# Patient Record
Sex: Male | Born: 1970 | Race: Black or African American | Hispanic: No | Marital: Married | Smoking: Former smoker
Health system: Southern US, Community
[De-identification: ages and names within clinical notes are randomized; demographics above are authoritative.]

## PROBLEM LIST (undated history)

## (undated) DIAGNOSIS — M545 Low back pain, unspecified: Secondary | ICD-10-CM

## (undated) DIAGNOSIS — F17200 Nicotine dependence, unspecified, uncomplicated: Secondary | ICD-10-CM

## (undated) DIAGNOSIS — I639 Cerebral infarction, unspecified: Secondary | ICD-10-CM

## (undated) DIAGNOSIS — I10 Essential (primary) hypertension: Secondary | ICD-10-CM

## (undated) DIAGNOSIS — L309 Dermatitis, unspecified: Secondary | ICD-10-CM

## (undated) DIAGNOSIS — M1990 Unspecified osteoarthritis, unspecified site: Secondary | ICD-10-CM

## (undated) HISTORY — DX: Nicotine dependence, unspecified, uncomplicated: F17.200

## (undated) HISTORY — DX: Essential (primary) hypertension: I10

## (undated) HISTORY — DX: Dermatitis, unspecified: L30.9

## (undated) HISTORY — DX: Low back pain: M54.5

## (undated) HISTORY — DX: Low back pain, unspecified: M54.50

## (undated) HISTORY — PX: TONSILLECTOMY: SUR1361

## (undated) HISTORY — DX: Unspecified osteoarthritis, unspecified site: M19.90

## (undated) HISTORY — PX: UPPER GASTROINTESTINAL ENDOSCOPY: SHX188

---
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
## 1997-10-20 ENCOUNTER — Emergency Department (HOSPITAL_COMMUNITY): Admission: EM | Admit: 1997-10-20 | Discharge: 1997-10-20 | Payer: Self-pay | Admitting: Emergency Medicine

## 1997-11-05 ENCOUNTER — Ambulatory Visit (HOSPITAL_COMMUNITY): Admission: RE | Admit: 1997-11-05 | Discharge: 1997-11-05 | Payer: Self-pay

## 2000-07-23 ENCOUNTER — Emergency Department (HOSPITAL_COMMUNITY): Admission: EM | Admit: 2000-07-23 | Discharge: 2000-07-23 | Payer: Self-pay | Admitting: Emergency Medicine

## 2000-07-24 ENCOUNTER — Encounter: Payer: Self-pay | Admitting: Emergency Medicine

## 2000-07-24 ENCOUNTER — Ambulatory Visit (HOSPITAL_COMMUNITY): Admission: RE | Admit: 2000-07-24 | Discharge: 2000-07-24 | Payer: Self-pay | Admitting: Emergency Medicine

## 2000-07-26 ENCOUNTER — Encounter: Payer: Self-pay | Admitting: Emergency Medicine

## 2000-07-26 ENCOUNTER — Emergency Department (HOSPITAL_COMMUNITY): Admission: EM | Admit: 2000-07-26 | Discharge: 2000-07-26 | Payer: Self-pay | Admitting: Emergency Medicine

## 2004-11-12 ENCOUNTER — Ambulatory Visit: Payer: Self-pay | Admitting: Internal Medicine

## 2004-11-16 ENCOUNTER — Ambulatory Visit: Payer: Self-pay

## 2004-11-16 ENCOUNTER — Ambulatory Visit: Payer: Self-pay | Admitting: *Deleted

## 2004-12-13 ENCOUNTER — Ambulatory Visit: Payer: Self-pay | Admitting: Internal Medicine

## 2005-03-11 ENCOUNTER — Ambulatory Visit: Payer: Self-pay | Admitting: Internal Medicine

## 2005-05-06 ENCOUNTER — Ambulatory Visit: Payer: Self-pay | Admitting: Internal Medicine

## 2005-06-03 ENCOUNTER — Ambulatory Visit: Payer: Self-pay | Admitting: Internal Medicine

## 2005-09-13 ENCOUNTER — Ambulatory Visit: Payer: Self-pay | Admitting: Internal Medicine

## 2005-11-14 ENCOUNTER — Ambulatory Visit: Payer: Self-pay | Admitting: Internal Medicine

## 2005-11-15 ENCOUNTER — Ambulatory Visit: Payer: Self-pay | Admitting: Internal Medicine

## 2005-11-16 ENCOUNTER — Ambulatory Visit (HOSPITAL_COMMUNITY): Admission: RE | Admit: 2005-11-16 | Discharge: 2005-11-16 | Payer: Self-pay | Admitting: Internal Medicine

## 2005-11-22 ENCOUNTER — Ambulatory Visit: Payer: Self-pay | Admitting: Internal Medicine

## 2005-11-25 ENCOUNTER — Encounter: Admission: RE | Admit: 2005-11-25 | Discharge: 2005-11-25 | Payer: Self-pay | Admitting: Internal Medicine

## 2005-12-06 ENCOUNTER — Ambulatory Visit: Payer: Self-pay | Admitting: Internal Medicine

## 2006-03-20 ENCOUNTER — Ambulatory Visit: Payer: Self-pay | Admitting: Internal Medicine

## 2006-04-20 ENCOUNTER — Ambulatory Visit: Payer: Self-pay | Admitting: Internal Medicine

## 2006-09-15 DIAGNOSIS — I10 Essential (primary) hypertension: Secondary | ICD-10-CM | POA: Insufficient documentation

## 2006-11-23 ENCOUNTER — Ambulatory Visit: Payer: Self-pay | Admitting: Internal Medicine

## 2006-12-04 ENCOUNTER — Ambulatory Visit: Payer: Self-pay

## 2006-12-04 ENCOUNTER — Encounter: Payer: Self-pay | Admitting: Internal Medicine

## 2007-02-07 ENCOUNTER — Telehealth: Payer: Self-pay | Admitting: Internal Medicine

## 2007-02-20 ENCOUNTER — Ambulatory Visit: Payer: Self-pay | Admitting: Internal Medicine

## 2007-02-20 DIAGNOSIS — L259 Unspecified contact dermatitis, unspecified cause: Secondary | ICD-10-CM | POA: Insufficient documentation

## 2007-03-09 ENCOUNTER — Telehealth: Payer: Self-pay | Admitting: Internal Medicine

## 2007-04-19 ENCOUNTER — Ambulatory Visit: Payer: Self-pay | Admitting: Internal Medicine

## 2007-04-19 DIAGNOSIS — F17200 Nicotine dependence, unspecified, uncomplicated: Secondary | ICD-10-CM | POA: Insufficient documentation

## 2007-06-11 ENCOUNTER — Ambulatory Visit: Payer: Self-pay | Admitting: Internal Medicine

## 2007-06-11 LAB — CONVERTED CEMR LAB
AST: 26 units/L (ref 0–37)
Albumin: 3.7 g/dL (ref 3.5–5.2)
Alkaline Phosphatase: 62 units/L (ref 39–117)
BUN: 9 mg/dL (ref 6–23)
Bilirubin, Direct: 0.2 mg/dL (ref 0.0–0.3)
Chloride: 108 meq/L (ref 96–112)
Cholesterol: 126 mg/dL (ref 0–200)
GFR calc Af Amer: 108 mL/min
GFR calc non Af Amer: 89 mL/min
Glucose, Bld: 98 mg/dL (ref 70–99)
LDL Cholesterol: 80 mg/dL (ref 0–99)
Potassium: 3.5 meq/L (ref 3.5–5.1)
Sodium: 139 meq/L (ref 135–145)

## 2007-06-18 ENCOUNTER — Ambulatory Visit: Payer: Self-pay | Admitting: Internal Medicine

## 2007-06-18 DIAGNOSIS — K5900 Constipation, unspecified: Secondary | ICD-10-CM | POA: Insufficient documentation

## 2007-07-26 ENCOUNTER — Telehealth: Payer: Self-pay | Admitting: Internal Medicine

## 2008-04-14 ENCOUNTER — Ambulatory Visit: Payer: Self-pay | Admitting: Internal Medicine

## 2008-04-14 LAB — CONVERTED CEMR LAB
Calcium: 9.5 mg/dL (ref 8.4–10.5)
GFR calc Af Amer: 96 mL/min
GFR calc non Af Amer: 80 mL/min
Glucose, Bld: 86 mg/dL (ref 70–99)
HDL: 37.1 mg/dL — ABNORMAL LOW (ref >39.0)
Potassium: 4.1 meq/L (ref 3.5–5.1)
Sodium: 141 meq/L (ref 135–145)
TSH: 0.82 microintl units/mL (ref 0.35–5.50)
Total CHOL/HDL Ratio: 3.7
VLDL: 10 mg/dL (ref 0–40)

## 2008-08-15 ENCOUNTER — Ambulatory Visit: Payer: Self-pay | Admitting: Internal Medicine

## 2008-08-15 DIAGNOSIS — B079 Viral wart, unspecified: Secondary | ICD-10-CM | POA: Insufficient documentation

## 2008-10-06 ENCOUNTER — Ambulatory Visit: Payer: Self-pay | Admitting: Internal Medicine

## 2008-10-06 LAB — CONVERTED CEMR LAB
ALT: 16 U/L (ref 0–53)
AST: 20 U/L (ref 0–37)
Albumin: 4.2 g/dL (ref 3.5–5.2)
Alkaline Phosphatase: 67 U/L (ref 39–117)
BUN: 10 mg/dL (ref 6–23)
Bilirubin, Direct: 0.3 mg/dL (ref 0.0–0.3)
CO2: 23 meq/L (ref 19–32)
Calcium: 9.5 mg/dL (ref 8.4–10.5)
Chloride: 107 meq/L (ref 96–112)
Cholesterol: 127 mg/dL (ref 0–200)
Creatinine, Ser: 1.01 mg/dL (ref 0.40–1.50)
Glucose, Bld: 95 mg/dL (ref 70–99)
HDL: 43 mg/dL (ref >39)
Indirect Bilirubin: 0.7 mg/dL (ref 0.0–0.9)
LDL Cholesterol: 76 mg/dL (ref 0–99)
Potassium: 4.2 meq/L (ref 3.5–5.3)
Sodium: 140 meq/L (ref 135–145)
Total Bilirubin: 1 mg/dL (ref 0.3–1.2)
Total CHOL/HDL Ratio: 3
Total Protein: 7.1 g/dL (ref 6.0–8.3)
Triglycerides: 38 mg/dL (ref <150)
VLDL: 8 mg/dL (ref 0–40)

## 2008-10-07 ENCOUNTER — Encounter: Payer: Self-pay | Admitting: Internal Medicine

## 2008-10-13 ENCOUNTER — Ambulatory Visit: Payer: Self-pay | Admitting: Internal Medicine

## 2008-10-13 DIAGNOSIS — B350 Tinea barbae and tinea capitis: Secondary | ICD-10-CM | POA: Insufficient documentation

## 2008-10-31 ENCOUNTER — Encounter: Payer: Self-pay | Admitting: Internal Medicine

## 2009-01-05 ENCOUNTER — Ambulatory Visit: Payer: Self-pay | Admitting: Internal Medicine

## 2009-01-05 DIAGNOSIS — M7710 Lateral epicondylitis, unspecified elbow: Secondary | ICD-10-CM | POA: Insufficient documentation

## 2009-03-20 ENCOUNTER — Ambulatory Visit: Payer: Self-pay | Admitting: Internal Medicine

## 2009-05-08 ENCOUNTER — Ambulatory Visit: Payer: Self-pay | Admitting: Internal Medicine

## 2009-06-04 ENCOUNTER — Ambulatory Visit: Payer: Self-pay | Admitting: Internal Medicine

## 2009-10-30 ENCOUNTER — Encounter: Payer: Self-pay | Admitting: Internal Medicine

## 2009-10-30 LAB — CONVERTED CEMR LAB
Calcium: 9.4 mg/dL (ref 8.4–10.5)
Glucose, Bld: 91 mg/dL (ref 70–99)
Potassium: 4.4 meq/L (ref 3.5–5.3)
Sodium: 141 meq/L (ref 135–145)

## 2009-11-06 ENCOUNTER — Ambulatory Visit: Payer: Self-pay | Admitting: Family Medicine

## 2009-11-06 ENCOUNTER — Ambulatory Visit: Payer: Self-pay | Admitting: Internal Medicine

## 2009-11-09 ENCOUNTER — Encounter: Payer: Self-pay | Admitting: Family Medicine

## 2010-01-11 ENCOUNTER — Telehealth: Payer: Self-pay | Admitting: Internal Medicine

## 2010-03-23 NOTE — Assessment & Plan Note (Signed)
Summary: 6 month follow up/mhf--Rm 3   Vital Signs:  Patient profile:   40 year old male Height:      69 inches Weight:      235.25 pounds BMI:     34.87 Temp:     97.9 degrees F oral Pulse rate:   90 / minute Pulse rhythm:   regular Resp:     18 per minute BP sitting:   140 / 94  (right arm) Cuff size:   large  Vitals Entered By: Mervin Kung CMA Duncan Dull) (November 06, 2009 3:41 PM) CC: Rm 3  6 month follow up. Pt having bilateral arm pain and wants to know if it is due to his arthritis. Is Patient Diabetic? No Comments Pt states he only takes Exforge HCT. Rx ran out on voltaren and triamcinolone. Nicki Guadalajara Fergerson CMA Duncan Dull)  November 06, 2009 3:46 PM    Primary Care Provider:  Dondra Spry DO  CC:  Rm 3  6 month follow up. Pt having bilateral arm pain and wants to know if it is due to his arthritis.Marland Kitchen  History of Present Illness: 40 y/o AA male co 1 month of bilateral arm pain sharp pain with abduction arm pain with laying on side no neck pain no numbness   no upper ext weakness  Preventive Screening-Counseling & Management  Alcohol-Tobacco     Smoking Status: current     Smoking Cessation Counseling: yes     Packs/Day: 1.0  Allergies: 1)  Prednisone  Past History:  Past Surgical History: Denies surgical history         Family History: Mother is age 84, has hypertension.  Father age 70 healthy.  Grandmother has history of cerebrovascular accident and diabetes.   No family history of colon cancer.             Physical Exam  General:  alert, well-developed, and well-nourished.   Neck:  supple and no carotid bruits.   Lungs:  normal respiratory effort and normal breath sounds.   Heart:  normal rate, regular rhythm, and no gallop.   Msk:  left shoulder tenderness.  normal ROM, no joint instability.   Neurologic:  cranial nerves II-XII intact and gait normal.   Psych:  normally interactive, good eye contact, not anxious appearing, and not depressed  appearing.     Impression & Recommendations:  Problem # 1:  SHOULDER PAIN, LEFT (ICD-719.41)  left shoulder pain c/w shoulder bursitis.  refer to ortho for steroid injection Orders: Orthopedic Referral (Ortho)  His updated medication list for this problem includes:    Meloxicam 15 Mg Tabs (Meloxicam) .Marland Kitchen... 1 tab by mouth daily with food x 7 days then as needed  Problem # 2:  ECZEMA, HANDS (ICD-692.9) Assessment: Unchanged  His updated medication list for this problem includes:    Triamcinolone Acetonide 0.5 % Oint (Triamcinolone acetonide) .Marland Kitchen... Apply two times a day  Problem # 3:  HYPERTENSION (ICD-401.9) Assessment: Unchanged  His updated medication list for this problem includes:    Exforge Hct 10-160-12.5 Mg Tabs (Amlodipine-valsartan-hctz) ..... One half tab  by mouth once daily  BP today: 140/94 Prior BP: 134/80 (06/04/2009)  Labs Reviewed: K+: 4.4 (10/30/2009) Creat: : 1.12 (10/30/2009)   Chol: 127 (10/06/2008)   HDL: 43 (10/06/2008)   LDL: 76 (10/06/2008)   TG: 38 (10/06/2008)  Complete Medication List: 1)  Voltaren 1 % Gel (Diclofenac sodium) .... Apply three times a day 2)  Exforge Hct 10-160-12.5 Mg Tabs (Amlodipine-valsartan-hctz) .Marland KitchenMarland KitchenMarland Kitchen  One half tab  by mouth once daily 3)  Triamcinolone Acetonide 0.5 % Oint (Triamcinolone acetonide) .... Apply two times a day 4)  Meloxicam 15 Mg Tabs (Meloxicam) .Marland Kitchen.. 1 tab by mouth daily with food x 7 days then as needed  Other Orders: Admin 1st Vaccine (09811) Flu Vaccine 11yrs + (91478)  Patient Instructions: 1)  Please schedule a follow-up appointment in 6 months. 2)  BMP prior to visit, ICD-9:  401.9 3)  Hepatic Panel prior to visit, ICD-9:  272.4 4)  Lipid Panel prior to visit, ICD-9:  272.4 5)  Please return for lab work one (1) week before your next appointment.  Prescriptions: TRIAMCINOLONE ACETONIDE 0.5 % OINT (TRIAMCINOLONE ACETONIDE) apply two times a day  #1 tub x 3   Entered and Authorized by:   D. Thomos Lemons  DO   Signed by:   D. Thomos Lemons DO on 11/06/2009   Method used:   [id number]       [address]       [place], [place]  [address]       Ph: [phone]       Fax: [phone]   RxID:   [id number] EXFORGE HCT 10-160-12.5 MG TABS (AMLODIPINE-VALSARTAN-HCTZ) one half tab  by mouth once daily  #90 x 1   Entered and Authorized by:   D. Thomos Lemons DO   Signed by:   D. Thomos Lemons DO on 11/06/2009   Method used:   [facility]        [id number]       [address]       [place], [place]  [address]       Ph: [phone]       Fax: [phone]   RxID:   [id number]   Current Allergies (reviewed today): PREDNISONE    Flu Vaccine Consent Questions     Do you have a history of severe allergic reactions to this vaccine? no    Any prior history of allergic reactions to egg and/or gelatin? no    Do you have a sensitivity to the preservative Thimersol? no    Do you have a past history of Guillan-Barre Syndrome? no    Do you currently have an acute febrile illness? no    Have you ever had a severe reaction to latex? no    Vaccine information given and explained to patient? yes    Are you currently pregnant? no    Lot Number:[id number]   Exp Date:08/21/2010   Site Given  Left Deltoid IM.  Nicki Guadalajara Fergerson CMA Duncan Dull)  November 06, 2009 3:56 PM

## 2010-03-23 NOTE — Assessment & Plan Note (Signed)
Summary: 6 week follow up/mhf, resched- jr   Vital Signs:  Patient profile:   40 year old male Weight:      235 pounds BMI:     34.83 O2 Sat:      99 % on Room air Temp:     97.8 degrees F oral Pulse rate:   91 / minute Pulse rhythm:   regular Resp:     18 per minute BP sitting:   130 / 100  (right arm) Cuff size:   large  Vitals Entered By: Glendell Docker CMA (March 20, 2009 3:42 PM)  O2 Flow:  Room air  Primary Care Provider:  D. Thomos Lemons DO  CC:  6 Week  Follow up .  History of Present Illness: 6 week  Follow up   Hypertension Follow-Up      This is a 40 year old man who presents for Hypertension follow-up.  The patient denies lightheadedness and urinary frequency.  The patient denies the following associated symptoms: chest pain.  Compliance with medications (by patient report) has been near 100%.  The patient reports that dietary compliance has been fair and poor.    elbow pain - resolved  tob abuse - he tried wellbutrin but he still had significant craving for cigarettes.     Allergies (verified): No Known Drug Allergies  Past History:  Past Medical History: Hypertension Smoker Low Back Pain Hand Eczema        Past Surgical History: Denies surgical history       Family History: Mother is age 21, has hypertension.  Father age 4 healthy.  Grandmother has history of cerebrovascular accident and diabetes.   No family history of colon cancer.         Social History: Married Alcohol use-no    Current Smoker Occupation: Holiday representative     Physical Exam  General:  alert, well-developed, and well-nourished.   Lungs:  normal respiratory effort and normal breath sounds.   Heart:  normal rate, regular rhythm, and no gallop.   Extremities:  trace left pedal edema and trace right pedal edema.     Impression & Recommendations:  Problem # 1:  HYPERTENSION (ICD-401.9) BP still suboptimal.  Change to exforge hct.   The following medications were  removed from the medication list:    Amlodipine Besylate 10 Mg Tabs (Amlodipine besylate) ..... One half tab by mouth once daily    Diovan Hct 160-12.5 Mg Tabs (Valsartan-hydrochlorothiazide) ..... One by mouth qd His updated medication list for this problem includes:    Exforge Hct 10-160-12.5 Mg Tabs (Amlodipine-valsartan-hctz) ..... One by mouth once daily  BP today: 130/100 Prior BP: 140/80 (01/05/2009)  Labs Reviewed: K+: 4.2 (10/06/2008) Creat: : 1.01 (10/06/2008)   Chol: 127 (10/06/2008)   HDL: 43 (10/06/2008)   LDL: 76 (10/06/2008)   TG: 38 (10/06/2008)  Problem # 2:  TOBACCO ABUSE (ICD-305.1)  Encouraged smoking cessation and discussed different methods for smoking cessation.   Pt advised to use wellbutrin and nicotine lozenges together  Complete Medication List: 1)  Voltaren 1 % Gel (Diclofenac sodium) .... Apply three times a day 2)  Exforge Hct 10-160-12.5 Mg Tabs (Amlodipine-valsartan-hctz) .... One by mouth once daily 3)  Bupropion Hcl 150 Mg Xr12h-tab (Bupropion hcl) .... One by mouth two times a day  Patient Instructions: 1)  use nicotine lozenges or gum as needed if you still feel the urge to smoke 2)  Please schedule a follow-up appointment in 2 months. Prescriptions: BUPROPION HCL  150 MG XR12H-TAB (BUPROPION HCL) one by mouth two times a day  #60 x 3   Entered and Authorized by:   D. Thomos Lemons DO   Signed by:   D. Thomos Lemons DO on 03/20/2009   Method used:   [facility] to        [facility]  [phone]* (retail)       [address] [place], [place]  [address]       Ph: [phone] or [phone]       Fax: [phone]   RxID:   [id number] EXFORGE HCT 10-160-12.5 MG TABS (AMLODIPINE-VALSARTAN-HCTZ) one by mouth once daily  #90 x 3   Entered and Authorized by:   D. Thomos Lemons DO   Signed by:   D. Thomos Lemons DO on 03/20/2009   Method used:   Electronically to        [facility]* (mail-order)             ,          Ph: [phone]        Fax: [phone]   RxID:   [id number]   Current Allergies (reviewed today): No known allergies

## 2010-03-23 NOTE — Miscellaneous (Signed)
Summary: Orders Update  Clinical Lists Changes  Orders: Added new Test order of T-Basic Metabolic Panel ([id number]) - Signed  

## 2010-03-23 NOTE — Assessment & Plan Note (Signed)
Summary: SHOULDER PAIN   Primary Care Provider:  Dondra Spry DO   History of Present Illness: 40 yo M here with L>R upper arm pain  Patient reports symptoms have been going on for past 1-2 months. Does not recall an acute injury. Works in Holiday representative and with sheet rock. Is right handed. Pain does not wake him up at nighttime. Pain worse with overhead activities and putting arm out to side. Also hurts to lie on this side. Not tried any medications for this - only icy hot patches No neck pain, numbness or tingling, weakness. No prior shoulder/arm issues/surgeries.  Medications Prior to Update: 1)  Voltaren 1 % Gel (Diclofenac Sodium) .... Apply Three Times A Day 2)  Exforge Hct 10-160-12.5 Mg Tabs (Amlodipine-Valsartan-Hctz) .... One Half Tab  By Mouth Once Daily 3)  Bupropion Hcl 150 Mg Xr12h-Tab (Bupropion Hcl) .... One By Mouth Two Times A Day 4)  Triamcinolone Acetonide 0.5 % Oint (Triamcinolone Acetonide) .... Apply Two Times A Day  Allergies (verified): 1)  Prednisone  Physical Exam  General:  alert, well-developed, and well-nourished.   Msk:  L Shoulder: No swelling, ecchymoses.  No gross deformity. No TTP at [facility] joint, about shoulder joint. FROM with painful arc. Positive hawkins and neers. Negative Speeds, Yergasons. Mildly positive crossover adduction. + empty can with 5-/5 strength limited by pain.  5/5 resisted internal/external rotation. Negative apprehension. NV intact distally.  R shoulder: No swelling, ecchymoses.  No gross deformity. No TTP at [facility] joint, about shoulder joint. FROM without painful arc Negative hawkins and neers. Negative Speeds, Yergasons. Negative crossover. Negative empty can with 5/5 strength.  5/5 resisted internal/external rotation. Negative apprehension. NV intact distally.   Impression & Recommendations:  Problem # 1:  SHOULDER PAIN, LEFT (ICD-719.41) Assessment Deteriorated 2/2 rotator cuff impingement.  Discussed  treatment options.  Given this has only been going on 1-2 months, will try oral medications and physical therapy with transition to home exercise program first.  Discussed if not improving would try subacromial injection.  Note given for work with restrictions.  F/u in 4-6 weeks for recheck.  His updated medication list for this problem includes:    Meloxicam 15 Mg Tabs (Meloxicam) .Marland Kitchen... 1 tab by mouth daily with food x 7 days then as needed  Complete Medication List: 1)  Voltaren 1 % Gel (Diclofenac sodium) .... Apply three times a day 2)  Exforge Hct 10-160-12.5 Mg Tabs (Amlodipine-valsartan-hctz) .... One half tab  by mouth once daily 3)  Triamcinolone Acetonide 0.5 % Oint (Triamcinolone acetonide) .... Apply two times a day 4)  Meloxicam 15 Mg Tabs (Meloxicam) .Marland Kitchen.. 1 tab by mouth daily with food x 7 days then as needed  Patient Instructions: 1)  Try to avoid painful activities (overhead activities, lifting with an extended arm). 2)  Take meloxicam daily with food for 7 days then as needed. 3)  Go to physical therapy for 1-3 visits to learn a home exercise program and it's VERY IMPORTANT you do this most days of the week for 6 weeks. 4)  If pain is worsening instead of improving, waking you at night, we can also do a steroid injection into your more painful shoulder. 5)  Ice area 15 minutes at a time 3-4 times a day. 6)  Otherwise follow up with me in 4-6 weeks to check on your progress. Prescriptions: MELOXICAM 15 MG TABS (MELOXICAM) 1 tab by mouth daily with food x 7 days then as needed  #30 x 1  Entered and Authorized by:   Norton Blizzard MD   Signed by:   Norton Blizzard MD on 11/06/2009   Method used:   [facility]        [facility] [address] [id number]       [address]       [place], [place]  [address]       Ph: [phone]       Fax: [phone]   RxID:   [id number]

## 2010-03-23 NOTE — Letter (Signed)
Summary: Out of Work  [facility]  [address]   [place], [place] [address]   Phone: [phone]  Fax:     November 06, 2009   Employee:  [employer]    To Whom It May Concern:   For Medical reasons, please excuse the above named employee from work for the following dates:  Start:   11/06/09    End:   11/06/09  Patient should not perform overhead activities, no lifting more than 15 pounds until follow up visit in 4-6 weeks.  If you need additional information, please feel free to contact our office.         Sincerely,    Norton Blizzard MD

## 2010-03-23 NOTE — Letter (Signed)
Summary: *Consult Note  [facility]  [address]   [place], [place] [address]   Phone: [phone]  Fax:     Re:    Scott Carson Mccloud DOB:    04-30-1970   Dear Dr Artist Pais:    Thank you for requesting that we see the above patient for consultation.  A copy of the detailed office note will be sent under separate cover, for your review.  Evaluation today is consistent with:  L > R rotator cuff impingement  Our recommendation is for:  Meloxicam 15mg  daily with food x 7 days then as needed Icing as needed Physical therapy to learn home exercise program - rotator cuff exercises and scapular stabilization Consideration of subacromial cortisone injection if he does not improve as expected.   After today's visit, the patients current medications include: 1)  VOLTAREN 1 % GEL (DICLOFENAC SODIUM) apply three times a day 2)  EXFORGE HCT 10-160-12.5 MG TABS (AMLODIPINE-VALSARTAN-HCTZ) one half tab  by mouth once daily 3)  TRIAMCINOLONE ACETONIDE 0.5 % OINT (TRIAMCINOLONE ACETONIDE) apply two times a day 4)  MELOXICAM 15 MG TABS (MELOXICAM) 1 tab by mouth daily with food x 7 days then as needed   Thank you for this consultation.  If you have any further questions regarding the care of this patient, please do not hesitate to contact me @ [phone].  Thank you for this opportunity to look after your patient.  Sincerely,   Norton Blizzard MD

## 2010-03-23 NOTE — Progress Notes (Signed)
Summary: Exforge Samples  Phone Note Call from Patient Call back at Home Phone [phone]   Caller: Patient Call For: Aqib Lough  Summary of Call: HE NEEDS SOME SAMPLES OF EXFORGE HCT.  HIS MAIL ORDER HAS NOT COME YET AND HE NEEDS 2 WEEKS OF MEDS. Initial call taken by: Roselle Locus,  January 11, 2010 1:35 PM  Follow-up for Phone Call        call returned to patient at [phone], he was informed samples for his dosing was not available. He states that he still has about 2  weeks remianing on current samples and will call back. Follow-up by: Glendell Docker CMA,  January 11, 2010 2:27 PM

## 2010-03-23 NOTE — Assessment & Plan Note (Signed)
Summary: ck blood press has been dizzy/dt   Vital Signs:  Patient profile:   40 year old male Height:      69 inches Weight:      235.25 pounds BMI:     34.87 O2 Sat:      98 % on Room air Temp:     98..3 degrees F oral Pulse rate:   84 / minute Pulse rhythm:   regular Resp:     18 per minute BP sitting:   134 / 80  (right arm) BP standing:   134 / 80  (right arm) Cuff size:   large  Vitals Entered By: Glendell Docker CMA (June 04, 2009 2:52 PM)  O2 Flow:  Room air CC: Rm 3- Dizziness Is Patient Diabetic? No  Does patient need assistance? Ambulation Impaired:Risk for fall   Primary Care Provider:  D. Thomos Lemons DO  CC:  Rm 3- Dizziness.  History of Present Illness:  40 y/o AA male for follow up he notes sudden onset of dizziness yest he did not drink much yest he held BP medication.  he is feeling better   Allergies: 1)  Prednisone  Past History:  Past Medical History: Hypertension Smoker  Low Back Pain Hand Eczema         Past Surgical History: Denies surgical history        Family History: Mother is age 26, has hypertension.  Father age 47 healthy.  Grandmother has history of cerebrovascular accident and diabetes.   No family history of colon cancer.            Social History: Married Alcohol use-no    Current Smoker Occupation: Holiday representative       Physical Exam  General:  alert, well-developed, and well-nourished.   Lungs:  normal respiratory effort and normal breath sounds.   Heart:  normal rate, regular rhythm, and no gallop.   Extremities:  trace left pedal edema and trace right pedal edema.   Skin:  chronic dry skin of hands   Impression & Recommendations:  Problem # 1:  HYPERTENSION (ICD-401.9) Pt with dizziness.  question dehydration.  hold bp med for additional 24 hrs. increase hydration.  resume 1/2 dose. Patient advised to call office if dizziness persist or worsen.  His updated medication list for this problem includes:   Exforge Hct 10-160-12.5 Mg Tabs (Amlodipine-valsartan-hctz) ..... One half tab  by mouth once daily  BP today: 134/80 Prior BP: 118/80 (05/08/2009)  Labs Reviewed: K+: 4.2 (10/06/2008) Creat: : 1.01 (10/06/2008)   Chol: 127 (10/06/2008)   HDL: 43 (10/06/2008)   LDL: 76 (10/06/2008)   TG: 38 (10/06/2008)  Problem # 2:  ECZEMA, HANDS (ICD-692.9)  His updated medication list for this problem includes:    Triamcinolone Acetonide 0.5 % Oint (Triamcinolone acetonide) .Marland Kitchen... Apply two times a day  refilled topical steroid.  use  as  directed.  Complete Medication List: 1)  Voltaren 1 % Gel (Diclofenac sodium) .... Apply three times a day 2)  Exforge Hct 10-160-12.5 Mg Tabs (Amlodipine-valsartan-hctz) .... One half tab  by mouth once daily 3)  Bupropion Hcl 150 Mg Xr12h-tab (Bupropion hcl) .... One by mouth two times a day 4)  Triamcinolone Acetonide 0.5 % Oint (Triamcinolone acetonide) .... Apply two times a day  Patient Instructions: 1)  Keep your next follow up appointment 2)  Restart your blood pressure on Friday at 1/2 dose. 3)  Call our office if your dizziness does not  improve or gets  worse. Prescriptions: TRIAMCINOLONE ACETONIDE 0.5 % OINT (TRIAMCINOLONE ACETONIDE) apply two times a day  #60 grams x 1   Entered and Authorized by:   D. Thomos Lemons DO   Signed by:   D. Thomos Lemons DO on 06/04/2009   Method used:   [facility]        [id number]       [address]       [place], [place]  [address]       Ph: [phone]       Fax: [phone]   RxID:   [id number]   Current Allergies (reviewed today): PREDNISONE

## 2010-03-23 NOTE — Assessment & Plan Note (Signed)
Summary: 2 month follow up/mhf rsc with pt/mhf   Vital Signs:  Patient profile:   40 year old male Height:      69 inches Weight:      237.75 pounds BMI:     35.24 O2 Sat:      98 % on Room air Temp:     98.2 degrees F oral Pulse rate:   96 / minute Pulse rhythm:   regular Resp:     18 per minute BP sitting:   118 / 80  (left arm) Cuff size:   large  Vitals Entered By: Glendell Docker CMA (May 08, 2009 3:05 PM)  O2 Flow:  Room air CC: 2 Month Follow up  Comments knot on neck resolve   Primary Care Provider:  Dondra Spry DO  CC:  2 Month Follow up .  History of Present Illness:  Hypertension Follow-Up      This is a 40 year old man who presents for Hypertension follow-up.  The patient denies lightheadedness and headaches.  The patient denies the following associated symptoms: chest pain.  Compliance with medications (by patient report) has been near 100%.    He cut back on cigarettes.  only smoking 2-4 per day  Allergies (verified): 1)  Prednisone  Past History:  Past Medical History: Hypertension Smoker Low Back Pain Hand Eczema         Family History: Mother is age 90, has hypertension.  Father age 36 healthy.  Grandmother has history of cerebrovascular accident and diabetes.   No family history of colon cancer.           Social History: Married Alcohol use-no    Current Smoker Occupation: Holiday representative      Physical Exam  General:  alert, well-developed, and well-nourished.   Lungs:  normal respiratory effort and normal breath sounds.   Heart:  normal rate, regular rhythm, and no gallop.   Extremities:  trace left pedal edema and trace right pedal edema.     Impression & Recommendations:  Problem # 1:  HYPERTENSION (ICD-401.9) Assessment Improved well controlled.   Maintain current medication regimen.  His updated medication list for this problem includes:    Exforge Hct 10-160-12.5 Mg Tabs (Amlodipine-valsartan-hctz) ..... One by mouth  once daily  BP today: 118/80 Prior BP: 130/100 (03/20/2009)  Labs Reviewed: K+: 4.2 (10/06/2008) Creat: : 1.01 (10/06/2008)   Chol: 127 (10/06/2008)   HDL: 43 (10/06/2008)   LDL: 76 (10/06/2008)   TG: 38 (10/06/2008)  Complete Medication List: 1)  Voltaren 1 % Gel (Diclofenac sodium) .... Apply three times a day 2)  Exforge Hct 10-160-12.5 Mg Tabs (Amlodipine-valsartan-hctz) .... One by mouth once daily 3)  Bupropion Hcl 150 Mg Xr12h-tab (Bupropion hcl) .... One by mouth two times a day  Patient Instructions: 1)  Please schedule a follow-up appointment in 6 months. 2)  BMP prior to visit, ICD-9:  401.9 3)  Please return for lab work one (1) week before your next appointment.   Current Allergies (reviewed today): PREDNISONE

## 2010-05-03 ENCOUNTER — Encounter: Payer: Self-pay | Admitting: *Deleted

## 2010-05-07 ENCOUNTER — Encounter: Payer: Self-pay | Admitting: Internal Medicine

## 2010-05-07 ENCOUNTER — Ambulatory Visit (INDEPENDENT_AMBULATORY_CARE_PROVIDER_SITE_OTHER): Payer: BC Managed Care – PPO | Admitting: Internal Medicine

## 2010-05-07 DIAGNOSIS — M25519 Pain in unspecified shoulder: Secondary | ICD-10-CM

## 2010-05-07 DIAGNOSIS — I10 Essential (primary) hypertension: Secondary | ICD-10-CM

## 2010-05-10 ENCOUNTER — Ambulatory Visit: Payer: BC Managed Care – PPO | Admitting: Family Medicine

## 2010-05-13 ENCOUNTER — Encounter: Payer: Self-pay | Admitting: Family Medicine

## 2010-05-13 ENCOUNTER — Ambulatory Visit (INDEPENDENT_AMBULATORY_CARE_PROVIDER_SITE_OTHER): Payer: BC Managed Care – PPO | Admitting: Family Medicine

## 2010-05-13 VITALS — BP 129/87 | HR 94 | Temp 98.2°F | Ht 70.0 in | Wt 215.0 lb

## 2010-05-13 DIAGNOSIS — M67919 Unspecified disorder of synovium and tendon, unspecified shoulder: Secondary | ICD-10-CM

## 2010-05-13 DIAGNOSIS — M7580 Other shoulder lesions, unspecified shoulder: Secondary | ICD-10-CM

## 2010-05-13 DIAGNOSIS — M719 Bursopathy, unspecified: Secondary | ICD-10-CM

## 2010-05-13 NOTE — Progress Notes (Signed)
  Subjective:    Scott Carson is a 40 y.o. male who presents with left shoulder pain. The symptoms began several months ago. Aggravating factors: repetitive activity, overhead, reaching. Pain is located lateral left shoulder. Discomfort is described as sharp/stabbing. Symptoms are exacerbated by repetitive movements, overhead movements and lying on the shoulder. Evaluation to date: plain films: per patient, DJD though these are not in our system. Therapy to date includes: rest, avoidance of offending activity, OTC analgesics which are not very effective and home exercises which are somewhat effective.  The following portions of the patient's history were reviewed and updated as appropriate: allergies, current medications, past family history, past medical history, past social history, past surgical history and problem list.  Review of Systems Pertinent items are noted in HPI.   Objective:    BP 129/87  Pulse 94  Temp(Src) 98.2 F (36.8 C) (Oral)  Ht 5\' 10"  (1.778 m)  Wt 215 lb (97.523 kg)  BMI 30.85 kg/m2 Right shoulder: normal active ROM, no tenderness, no impingement sign  Left shoulder: negative for tenderness about the glenohumeral joint, negative for tenderness over the acromioclavicular joint, full ROM, positive for impingement sign, sensory exam normal, radial pulse intact and + painful arc, empty can (5-/5 strength but 5/5 resisted IR/ER), + hawkins and neers.  Negative Speeds and yergasons.     Assessment:    Left rotator cuff tendinitis    Plan:    Rest, ice, compression, and elevation (RICE) therapy. NSAIDs per medication orders. Shoulder injection. See procedure note. Physical therapy referral. Follow up in 1 month.   INJECTION: Patient was given informed consent, signed copy in the chart. Appropriate time out was taken. Area prepped and draped in usual sterile fashion. 3 cc of lidocaine plus 1 cc of depomedrol were injected into the left subacromial space using a  posterior approach. The patient tolerated the procedure well. There were no complications. Post procedure instructions were given.

## 2010-05-24 ENCOUNTER — Ambulatory Visit: Payer: BC Managed Care – PPO | Admitting: Physical Therapy

## 2010-05-25 NOTE — Assessment & Plan Note (Signed)
Summary: 6 month follow up/dt   Vital Signs:  Patient profile:   40 year old male Height:      69 inches Weight:      216 pounds BMI:     32.01 O2 Sat:      99 % on Room air Temp:     97.6 degrees F oral Pulse rate:   85 / minute Resp:     18 per minute BP sitting:   110 / 80  (left arm) Cuff size:   large  Vitals Entered By: Glendell Docker CMA (May 07, 2010 2:13 PM)  O2 Flow:  Room air CC: 6 month follow up  Is Patient Diabetic? No Pain Assessment Patient in pain? no        Primary Care Provider:  Dondra Spry DO  CC:  6 month follow up .  History of Present Illness: Hypertension Follow-Up      This is a 40 year old man who presents for Hypertension follow-up.  The patient denies lightheadedness and headaches.  The patient denies the following associated symptoms: chest pain and chest pressure.  Compliance with medications (by patient report) has been near 100%.  The patient reports that dietary compliance has been fair.    c/o left shoulder pain.  pt seen by ortho.  he did not complete PT as directed pain worse with lifting  Preventive Screening-Counseling & Management  Alcohol-Tobacco     Smoking Status: current  Allergies: 1)  Prednisone  Past History:  Past Medical History: Hypertension Smoker  Low Back Pain Hand Eczema          Past Surgical History: Denies surgical history          Social History: Married Alcohol use-no    Current Smoker Occupation: Holiday representative        Physical Exam  General:  alert, well-developed, and well-nourished.   Lungs:  normal respiratory effort and normal breath sounds.   Heart:  normal rate, regular rhythm, and no gallop.   Msk:  normal ROM, no joint swelling, and no joint instability.   Extremities:  trace left pedal edema and trace right pedal edema.     Impression & Recommendations:  Problem # 1:  HYPERTENSION (ICD-401.9) Assessment Improved  The following medications were removed from the  medication list:    Exforge Hct 10-160-12.5 Mg Tabs (Amlodipine-valsartan-hctz) ..... One half tab  by mouth once daily His updated medication list for this problem includes:    Tribenzor 20-5-12.5 Mg Tabs (Olmesartan-amlodipine-hctz) ..... One by mouth once daily  BP today: 110/80 Prior BP: 140/94 (11/06/2009)  Labs Reviewed: K+: 4.4 (10/30/2009) Creat: : 1.12 (10/30/2009)   Chol: 127 (10/06/2008)   HDL: 43 (10/06/2008)   LDL: 76 (10/06/2008)   TG: 38 (10/06/2008)  Problem # 2:  SHOULDER PAIN, LEFT (ICD-719.41) Assessment: Unchanged pt with intermittent pain.  pt not able to rest adequately due to work constraints.  pt will try to avoid lifting x 2-4 weeks reviewed home exercises once symptoms improve The following medications were removed from the medication list:    Meloxicam 15 Mg Tabs (Meloxicam) .Marland Kitchen... 1 tab by mouth daily with food x 7 days then as needed  Complete Medication List: 1)  Triamcinolone Acetonide 0.5 % Oint (Triamcinolone acetonide) .... Apply two times a day 2)  Tribenzor 20-5-12.5 Mg Tabs (Olmesartan-amlodipine-hctz) .... One by mouth once daily  Patient Instructions: 1)  Please schedule a follow-up appointment in 6 months. Prescriptions: TRIBENZOR 20-5-12.5 MG TABS (OLMESARTAN-AMLODIPINE-HCTZ)  one by mouth once daily  #90 x 1   Entered and Authorized by:   D. Thomos Lemons DO   Signed by:   D. Thomos Lemons DO on 05/07/2010   Method used:   [facility]ically to        [facility]  [address] [id number]       [address]       [place], [place]  [address]       Ph: [phone]       Fax: [phone]   RxID:   [id number] TRIBENZOR 20-5-12.5 MG TABS [facility]) one by mouth once daily  #90 x 1   Entered and Authorized by:   D. Thomos Lemons DO   Signed by:   D. Thomos Lemons DO on 05/07/2010   Method used:   Print then Give to Patient   RxID:   [id number]    Orders Added: 1)  Est. Patient Level III  [[id number]]    Current Allergies (reviewed today): PREDNISONE

## 2010-06-04 ENCOUNTER — Ambulatory Visit: Payer: BC Managed Care – PPO | Admitting: Physical Therapy

## 2010-06-17 ENCOUNTER — Encounter: Payer: Self-pay | Admitting: Family Medicine

## 2010-06-17 ENCOUNTER — Ambulatory Visit (INDEPENDENT_AMBULATORY_CARE_PROVIDER_SITE_OTHER): Payer: BC Managed Care – PPO | Admitting: Family Medicine

## 2010-06-17 VITALS — BP 144/92 | HR 84 | Temp 98.3°F | Ht 69.0 in | Wt 210.0 lb

## 2010-06-17 DIAGNOSIS — M25519 Pain in unspecified shoulder: Secondary | ICD-10-CM

## 2010-06-17 NOTE — Progress Notes (Signed)
  Subjective:    Patient ID: Scott Carson, male    DOB: 1970-12-31, 40 y.o.   MRN: [id number]  HPI 40 y/o M here for 1 month f/u left shoulder rotator cuff tendinopathy/subacromial bursitis.  Pain started several months ago Worse with overhead activities, reaching. X-rays reportedly were negative but done at outside facility. Was not improving with nsaids so proceeded with subacromial injection at last visit 1 month ago which helped significantly. Did not attend PT. No pain currently - can get up to 1-2/10 with certain activities however.  Past Medical History  Diagnosis Date  . Hypertension     Current Outpatient Prescriptions on File Prior to Visit  Medication Sig Dispense Refill  . meloxicam (MOBIC) 15 MG tablet Take 15 mg by mouth daily. With food x 7 days then as needed       . triamcinolone (KENALOG) 0.5 % ointment Apply topically 2 (two) times daily.        Marya Landry 20-5-12.5 MG TABS Take 1 tablet by mouth Daily.        History reviewed. No pertinent past surgical history.  Allergies  Allergen Reactions  . Prednisone     REACTION: feels hyper    History   Social History  . Marital Status: Married    Spouse Name: N/A    Number of Children: N/A  . Years of Education: N/A   Occupational History  . Not on file.   Social History Main Topics  . Smoking status: Current Everyday Smoker -- 1.0 packs/day    Types: Cigarettes  . Smokeless tobacco: Not on file  . Alcohol Use: No  . Drug Use: No  . Sexually Active: Not on file   Other Topics Concern  . Not on file   Social History Narrative  . No narrative on file    Family History  Problem Relation Age of Onset  . Hypertension Mother   . Diabetes Maternal Grandmother   . Stroke Maternal Grandmother     BP 144/92  Pulse 84  Temp(Src) 98.3 F (36.8 C) (Oral)  Ht 5\' 9"  (1.753 m)  Wt 210 lb (95.255 kg)  BMI 31.01 kg/m2  Review of Systems See HPI above.    Objective:   Physical Exam Gen: NAD L  shoulder: No gross deformity, swelling, or bruising. No focal TTP. FROM without painful arc Strength 5/5 with resisted IR/ER, empty can. Negative hawkins and neers Negative speeds and yergasons NVI distally    R shoulder: FROM without pain or weakness.  Assessment & Plan:  1. Left rotator cuff tendinopathy/subacromial bursitis - significantly improved.  Exam negative.  Start theraband exercises - return to full duty without restrictions in 1 1/2 weeks.  NSAIDs as needed, f/u prn.

## 2010-06-17 NOTE — Assessment & Plan Note (Signed)
Left rotator cuff tendinopathy/subacromial bursitis - significantly improved.  Exam negative.  Start theraband exercises - return to full duty without restrictions in 1 1/2 weeks.  NSAIDs as needed, f/u prn.

## 2010-07-28 ENCOUNTER — Encounter: Payer: Self-pay | Admitting: Internal Medicine

## 2010-07-28 ENCOUNTER — Telehealth: Payer: Self-pay | Admitting: *Deleted

## 2010-07-28 NOTE — Telephone Encounter (Signed)
Patient called and left voice message stating that he previously discussed losing weight and diet changes. His message states that he is concerned that he has lost a significant amount of wt and wanted to touch base with Dr Artist Pais to see if there is any concern.

## 2010-07-29 NOTE — Telephone Encounter (Signed)
Call placed to patient [phone]. He states the was at 230 at his last office visit,and he is now at 197. He stated that he used to smoke marijuana and has stopped about a week and half ago. He is concerned that he does not have an appetite, but he states that he feels fine. He would like to know if there should be any concerns.

## 2010-07-29 NOTE — Telephone Encounter (Signed)
Please clarify HOW MUCH he eats, and if he is truly eating poorly then he should go ahead and make appt to come and get checked out.  If appetite just down RELATIVE to when he was smoking marijuana, and his food intake is pretty normal, then I would reassure him.

## 2010-07-30 ENCOUNTER — Ambulatory Visit: Payer: BC Managed Care – PPO | Admitting: Family

## 2010-07-30 DIAGNOSIS — Z0289 Encounter for other administrative examinations: Secondary | ICD-10-CM

## 2010-07-30 NOTE — Telephone Encounter (Signed)
Call placed to patient at [phone], no answer. A detailed voice message was left for patient to return call

## 2010-07-30 NOTE — Telephone Encounter (Signed)
Patient states that he is eating, but half of what he used to. He stated that he is feeling fine and does not need to be seen. He was reassured, and advised to continue to monitor his weight,and if he still has concerns; he was advised to schedule appointment for evaluation. Patient has verbalized understanding and agrees to follow up if needed. He stated that he will keep his scheduled appointment for August.

## 2010-07-30 NOTE — Telephone Encounter (Signed)
Pt returned phone call. Please call back at [phone].

## 2010-08-13 ENCOUNTER — Telehealth: Payer: Self-pay | Admitting: Family

## 2010-08-13 ENCOUNTER — Ambulatory Visit (INDEPENDENT_AMBULATORY_CARE_PROVIDER_SITE_OTHER): Payer: BC Managed Care – PPO | Admitting: Family Medicine

## 2010-08-13 ENCOUNTER — Encounter: Payer: Self-pay | Admitting: Family Medicine

## 2010-08-13 DIAGNOSIS — R079 Chest pain, unspecified: Secondary | ICD-10-CM

## 2010-08-13 NOTE — Telephone Encounter (Signed)
Call returned to patient at [phone], he was advised per Sandford Craze office visit was needed. He has scheduled for 08/13/2010 @ 1:45pm with Dr Milinda Cave.

## 2010-08-13 NOTE — Telephone Encounter (Signed)
Pt calls today with complaint of left sided chest pain last night following an argument with his wife. Pain was described as intermittent sharp shooting pain- non-radiating.   Got better after he laid down 9PM.  Denies current chest pain or shortness of breath.  I advised that he be seen in the office today- go to the ED if he develops recurrent chest pain.  Please schedule pt to be seen today- his number is [phone].

## 2010-08-14 DIAGNOSIS — R079 Chest pain, unspecified: Secondary | ICD-10-CM | POA: Insufficient documentation

## 2010-08-14 NOTE — Assessment & Plan Note (Signed)
Atypical, doubt cardiac etiology. Seems anxiety/stress-induced.  We discussed controlling RFs for CAD--control bp, quit smoking, get more physically active, eat prudent diet; no further testing indicated at this time.  We'll try to locate the stress test that he mentions having a few years ago. We discussed the characteristics of chest pain worrisome for angina/CAD, and he will seek medical care immediately if these begin to occur.

## 2010-08-14 NOTE — Progress Notes (Signed)
OFFICE VISIT  08/14/2010   CC:  Chief Complaint  Patient presents with  . Anxiety    c/o intermittent chest pain     HPI:    Patient is a 40 y.o. African-American male who presents for chest pain. Yesterday while in an argument with his wife he had left sided chest pain like a squeeze of his pectoral muscle.  He was NOT exerting himself physically, nor was he yelling or in a rage. No SOB, no diaphoresis, no nausea, no radiation, no palpitations, no dizziness.  It waxed and waned for a couple of hours or so and has not returned.   No recent URI, no fevers, no recent chest wall trauma, no substernal burning/burping/GI upset. He does smoke and he has HTN.  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 He is the primary caregiver for his chronically ill wife, who has MS.  He also has 3 children at home.  Of note, he reports having a stress test in the past that was normal: describes pharmacologic stress, cannot remember where or who did the test but thinks it was over near [facility] hospital a few years ago.  All that can be located in his chart is a 2 D echo from 2008 that was normal.  Past Medical History  Diagnosis Date  . Hypertension   . Smoker   . Low back pain   . Eczema of hand     Past Surgical History  Procedure Date  . No past surgeries     denies surgical history    Outpatient Prescriptions Prior to Visit  Medication Sig Dispense Refill  . meloxicam (MOBIC) 15 MG tablet Take 15 mg by mouth daily. With food x 7 days then as needed       . triamcinolone (KENALOG) 0.5 % ointment Apply topically 2 (two) times daily.        Marya Landry 20-5-12.5 MG TABS Take 1 tablet by mouth Daily.        Allergies  Allergen Reactions  . Prednisone     REACTION: feels hyper    ROS As per HPI  PE: Blood pressure 130/90, pulse 85, temperature 98.1 F (36.7 C), temperature source Oral, resp. rate 18, height 5\' 9"  (1.753  m), weight 205 lb (92.987 kg). Gen: Alert, well appearing.  Patient is oriented to person, place, time, and situation. HEENT: Scalp without lesions or hair loss.  Ears: EACs clear, normal epithelium.  TMs with good light reflex and landmarks bilaterally.  Eyes: no injection, icteris, swelling, or exudate.  EOMI, PERRLA. Nose: no drainage or turbinate edema/swelling.  No injection or focal lesion.  Mouth: lips without lesion/swelling.  Oral mucosa pink and moist.  Dentition intact and without obvious caries or gingival swelling.  Oropharynx without erythema, exudate, or swelling.  Neck: supple, ROM full.  Carotids 2+ bilat, without bruit.  No lymphadenopathy, thyromegaly, or mass. Chest: symmetric expansion, nonlabored respirations.  Clear and equal breath sounds in all lung fields.  No chest wall tenderness. CV: RRR, no m/r/g.  Peripheral pulses 2+ and symmetric. ABD: soft, NT, ND, BS normal.  No hepatospenomegaly or mass.  No bruits. EXT: no clubbing, cyanosis, or edema.    LABS:  12 lead EKG today showed NSR, rate 73, normal axis, normal wave morphologies and voltages.  No ischemia. No prior ekg for comparison.  IMPRESSION AND PLAN:  Chest pain Atypical, doubt cardiac etiology. Seems anxiety/stress-induced.  We discussed controlling RFs for CAD--control bp, quit smoking, get more physically active, eat prudent diet; no further testing indicated at this time.  We'll try to locate the stress test that he mentions having a few years ago. We discussed the characteristics of chest pain worrisome for angina/CAD, and he will seek medical care immediately if these begin to occur.      FOLLOW UP: Return if symptoms worsen or fail to improve.

## 2010-11-04 ENCOUNTER — Ambulatory Visit: Payer: BC Managed Care – PPO | Admitting: Internal Medicine

## 2010-11-05 ENCOUNTER — Encounter: Payer: Self-pay | Admitting: Internal Medicine

## 2010-11-05 ENCOUNTER — Ambulatory Visit (INDEPENDENT_AMBULATORY_CARE_PROVIDER_SITE_OTHER): Payer: BC Managed Care – PPO | Admitting: Internal Medicine

## 2010-11-05 DIAGNOSIS — I10 Essential (primary) hypertension: Secondary | ICD-10-CM

## 2010-11-05 DIAGNOSIS — R1310 Dysphagia, unspecified: Secondary | ICD-10-CM

## 2010-11-05 DIAGNOSIS — Z23 Encounter for immunization: Secondary | ICD-10-CM

## 2010-11-05 DIAGNOSIS — L259 Unspecified contact dermatitis, unspecified cause: Secondary | ICD-10-CM

## 2010-11-05 DIAGNOSIS — L239 Allergic contact dermatitis, unspecified cause: Secondary | ICD-10-CM

## 2010-11-05 DIAGNOSIS — R634 Abnormal weight loss: Secondary | ICD-10-CM

## 2010-11-05 LAB — CBC WITH DIFFERENTIAL/PLATELET
Basophils Relative: 0.3 % (ref 0.0–3.0)
Eosinophils Absolute: 0.1 10*3/uL (ref 0.0–0.7)
Hemoglobin: 14.8 g/dL (ref 13.0–17.0)
Lymphocytes Relative: 48.5 % — ABNORMAL HIGH (ref 12.0–46.0)
MCHC: 32.8 g/dL (ref 30.0–36.0)
Neutro Abs: 2.4 10*3/uL (ref 1.4–7.7)
RBC: 4.93 Mil/uL (ref 4.22–5.81)

## 2010-11-05 LAB — HEPATIC FUNCTION PANEL
AST: 24 U/L (ref 0–37)
Albumin: 4.3 g/dL (ref 3.5–5.2)
Alkaline Phosphatase: 63 U/L (ref 39–117)
Total Protein: 7.3 g/dL (ref 6.0–8.3)

## 2010-11-05 MED ORDER — TRIAMCINOLONE ACETONIDE 0.5 % EX OINT: TOPICAL_OINTMENT | Freq: Two times a day (BID) | CUTANEOUS | Status: DC

## 2010-11-05 MED ORDER — OLMESARTAN-AMLODIPINE-HCTZ 20-5-12.5 MG PO TABS: 1.0000 | ORAL_TABLET | Freq: Every day | ORAL | Status: DC

## 2010-11-05 NOTE — Assessment & Plan Note (Signed)
40 year old [place] American male with unexplained weight loss.  Abdominal fullness and dysphagia concerning for GI etiology. Refer to Dr. Christella Hartigan for further evaluation. Obtain thyroid function studies, CBCD, LFTs.

## 2010-11-05 NOTE — Assessment & Plan Note (Signed)
Patient has scaly patch below umbilicus. I suspect he has metal allergy from his belt. Patient advised to use triamcinolone ointment 2 times a day x2 weeks.

## 2010-11-05 NOTE — Assessment & Plan Note (Signed)
Well controlled.  Continue current medication regimen. BP: 130/90 mmHg  Monitor electrolytes and kidney function

## 2010-11-05 NOTE — Progress Notes (Signed)
Subjective:    Patient ID: Scott Carson, male    DOB: 04-23-1970, 40 y.o.   MRN: [id number]  Hypertension This is a chronic problem. The current episode started more than 1 year ago. The problem is unchanged. The problem is controlled. Pertinent negatives include no anxiety, chest pain or headaches. There are no associated agents to hypertension. Risk factors for coronary artery disease include obesity, male gender and smoking/tobacco exposure. Past treatments include calcium channel blockers, angiotensin blockers and diuretics. The current treatment provides significant improvement. There are no compliance problems.  There is no history of angina or CVA.   Patient complains of unexplained weight loss over the last 6 -12 months.  He complains of abdominal fullness. He also has intermittent dysphasia. He has history of GERD and tobacco abuse.  Complains of rash on lower abd.  Review of Systems  Cardiovascular: Negative for chest pain.  Neurological: Negative for headaches.   Past Medical History  Diagnosis Date  . Hypertension   . Smoker   . Low back pain   . Eczema of hand     History   Social History  . Marital Status: Married    Spouse Name: N/A    Number of Children: N/A  . Years of Education: N/A   Occupational History  . Not on file.   Social History Main Topics  . Smoking status: Former Smoker -- 1.0 packs/day    Types: Cigarettes    Quit date: 11/01/2010  . Smokeless tobacco: Not on file  . Alcohol Use: No  . Drug Use: No  . Sexually Active: Not on file   Other Topics Concern  . Not on file   Social History Narrative   MarriedAlcohol use-no   Current SmokerOccupation: Holiday representative         Past Surgical History  Procedure Date  . No past surgeries     denies surgical history    Family History  Problem Relation Age of Onset  . Hypertension Mother   . Diabetes Maternal Grandmother   . Stroke Maternal Grandmother   . Other Neg Hx     no family history  of colon cancer    Allergies  Allergen Reactions  . Prednisone     REACTION: feels hyper    Current Outpatient Prescriptions on File Prior to Visit  Medication Sig Dispense Refill  . meloxicam (MOBIC) 15 MG tablet Take 15 mg by mouth daily. With food x 7 days then as needed       . DISCONTD: triamcinolone (KENALOG) 0.5 % ointment Apply topically 2 (two) times daily.         BP 130/90  Temp(Src) 98.4 F (36.9 C) (Oral)  Wt 218 lb (98.884 kg)       Objective:   Physical Exam   Constitutional: Appears well-developed and well-nourished. No distress.  Head: Normocephalic and atraumatic.  Mouth/Throat: Oropharynx is clear and moist.  Eyes: Conjunctivae are normal. Pupils are equal, round, and reactive to light.  Neck: Normal range of motion. Neck supple. No thyromegaly present. No carotid bruit Cardiovascular: Normal rate, regular rhythm and normal heart sounds.  Exam reveals no gallop and no friction rub.   No murmur heard. Pulmonary/Chest: Effort normal and breath sounds normal.  No wheezes. No rales.  Abdominal: Soft. Bowel sounds are normal. No mass. There is no tenderness.  Neurological: Alert. No cranial nerve deficit.  Skin: scaly patch below umbilicus Psychiatric: Normal mood and affect. Behavior is normal.  Assessment & Plan:

## 2010-11-08 ENCOUNTER — Encounter: Payer: Self-pay | Admitting: Gastroenterology

## 2010-11-08 LAB — BASIC METABOLIC PANEL
CO2: 22 mEq/L (ref 19–32)
Calcium: 9.6 mg/dL (ref 8.4–10.5)
Chloride: 106 mEq/L (ref 96–112)
Sodium: 140 mEq/L (ref 135–145)

## 2010-11-29 ENCOUNTER — Encounter: Payer: Self-pay | Admitting: Internal Medicine

## 2010-12-06 ENCOUNTER — Telehealth: Payer: Self-pay | Admitting: Gastroenterology

## 2010-12-06 ENCOUNTER — Ambulatory Visit: Payer: BC Managed Care – PPO | Admitting: Gastroenterology

## 2010-12-06 NOTE — Telephone Encounter (Signed)
Do not bill 

## 2010-12-28 ENCOUNTER — Encounter: Payer: Self-pay | Admitting: Gastroenterology

## 2010-12-28 ENCOUNTER — Ambulatory Visit (INDEPENDENT_AMBULATORY_CARE_PROVIDER_SITE_OTHER): Payer: BC Managed Care – PPO | Admitting: Gastroenterology

## 2010-12-28 DIAGNOSIS — R634 Abnormal weight loss: Secondary | ICD-10-CM

## 2010-12-28 DIAGNOSIS — R1310 Dysphagia, unspecified: Secondary | ICD-10-CM

## 2010-12-28 NOTE — Patient Instructions (Addendum)
You have been given a separate informational sheet regarding your tobacco use, the importance of quitting and local resources to help you quit. You will be set up for an upper endoscopy at [facility]. Chew your food well, eat slowly, take small bites.

## 2010-12-28 NOTE — Progress Notes (Signed)
HPI: This is a   very pleasant 40 year old man who I meeting for the first time today  Weight has been fluctuating, down 30 then back up 30 pounds.  He has trouble with swallowing for 1-2 months. Solid food only.   Seems like it happens 2 times per week.  Has pyrosis, rarely, doesn't take any meds for it (only once every 2 weeks).  No esophageal problems in his family. No overt bleeding.    Symptoms have been going on for one to 2 months.   No lumps, bumps on body.    CBC, complete metabolic profile, HIV testing about 2 months ago were all normal.   Review of systems: Pertinent positive and negative review of systems were noted in the above HPI section. Complete review of systems was performed and was otherwise normal.    Past Medical History  Diagnosis Date  . Hypertension   . Smoker   . Low back pain   . Eczema of hand     Past Surgical History  Procedure Date  . No past surgeries     denies surgical history    Current Outpatient Prescriptions  Medication Sig Dispense Refill  . Amlodipine-Valsartan-HCTZ (EXFORGE HCT) 10-160-12.5 MG TABS Take 1 tablet by mouth daily.        Marland Kitchen triamcinolone (KENALOG) 0.5 % ointment Apply topically 2 (two) times daily.  60 g  5    Allergies as of 12/28/2010 - Review Complete 12/28/2010  Allergen Reaction Noted  . Prednisone  05/08/2009    Family History  Problem Relation Age of Onset  . Hypertension Mother   . Diabetes Maternal Grandmother   . Stroke Maternal Grandmother   . Other Neg Hx     no family history of colon cancer    History   Social History  . Marital Status: Married    Spouse Name: N/A    Number of Children: N/A  . Years of Education: N/A   Occupational History  . Not on file.   Social History Main Topics  . Smoking status: Current Everyday Smoker -- 1.0 packs/day    Types: Cigarettes  . Smokeless tobacco: Never Used   Comment: Counseling sheet given to quit smoking in exam room   . Alcohol Use: No  .  Drug Use: No  . Sexually Active: Not on file   Other Topics Concern  . Not on file   Social History Narrative   MarriedAlcohol use-no   Current SmokerOccupation: Holiday representative            Physical Exam: BP 142/86  Pulse 80  Ht 6' (1.829 m)  Wt 220 lb (99.791 kg)  BMI 29.84 kg/m2 Constitutional: generally well-appearing Psychiatric: alert and oriented x3 Eyes: extraocular movements intact Mouth: oral pharynx moist, no lesions Neck: supple no lymphadenopathy Cardiovascular: heart regular rate and rhythm Lungs: clear to auscultation bilaterally Abdomen: soft, nontender, nondistended, no obvious ascites, no peritoneal signs, normal bowel sounds Extremities: no lower extremity edema bilaterally Skin: no lesions on visible extremities    Assessment and plan: 40 y.o. male with   new, intermittent, solid food dysphagia, recent weight fluctuation  We will proceed with EGD at his soonest convenience (I offered him our afternoon but he cannot work that into his schedule so we are probably looking at early next week instead) . That's suspicious for underlying neoplasm, perhaps GERD related issue such as peptic stricturing, edema. He does chew his food well, eat slowly and take small bites for now

## 2010-12-29 ENCOUNTER — Encounter: Payer: Self-pay | Admitting: Gastroenterology

## 2011-01-03 ENCOUNTER — Ambulatory Visit: Payer: BC Managed Care – PPO | Admitting: Internal Medicine

## 2011-01-04 ENCOUNTER — Encounter: Payer: Self-pay | Admitting: Gastroenterology

## 2011-01-04 ENCOUNTER — Ambulatory Visit (AMBULATORY_SURGERY_CENTER): Payer: BC Managed Care – PPO | Admitting: Gastroenterology

## 2011-01-04 DIAGNOSIS — R634 Abnormal weight loss: Secondary | ICD-10-CM

## 2011-01-04 DIAGNOSIS — K2940 Chronic atrophic gastritis without bleeding: Secondary | ICD-10-CM

## 2011-01-04 DIAGNOSIS — K2970 Gastritis, unspecified, without bleeding: Secondary | ICD-10-CM

## 2011-01-04 DIAGNOSIS — R1310 Dysphagia, unspecified: Secondary | ICD-10-CM

## 2011-01-04 DIAGNOSIS — K222 Esophageal obstruction: Secondary | ICD-10-CM

## 2011-01-04 MED ORDER — SODIUM CHLORIDE 0.9 % IV SOLN: 500.0000 mL | INTRAVENOUS | Status: DC

## 2011-01-04 NOTE — Patient Instructions (Addendum)
You have mild gastritis, and the doctor biopsied the area to rule out H-pylori bacteria.   If you have that, we will call you.  H-pylori requires antibiotics. You need to stay on a soft diet for the rest of today.   You may resume your routine medications.  If you have any questions, call us at [phone].  Thank-you.

## 2011-01-05 ENCOUNTER — Telehealth: Payer: Self-pay | Admitting: *Deleted

## 2011-01-05 NOTE — Telephone Encounter (Signed)
Follow up Call- Patient questions:  Do you have a fever, pain , or abdominal swelling? no Pain Score  0 *  Have you tolerated food without any problems? yes  Have you been able to return to your normal activities? yes  Do you have any questions about your discharge instructions: Diet   no Medications  no Follow up visit  no  Do you have questions or concerns about your Care? no  Actions: * If pain score is 4 or above: No action needed, pain <4.  Reviewed results of procedure with patient.

## 2011-01-07 ENCOUNTER — Encounter: Payer: Self-pay | Admitting: Internal Medicine

## 2011-01-07 ENCOUNTER — Ambulatory Visit (INDEPENDENT_AMBULATORY_CARE_PROVIDER_SITE_OTHER): Payer: BC Managed Care – PPO | Admitting: Internal Medicine

## 2011-01-07 DIAGNOSIS — M545 Low back pain, unspecified: Secondary | ICD-10-CM

## 2011-01-07 DIAGNOSIS — I10 Essential (primary) hypertension: Secondary | ICD-10-CM

## 2011-01-07 MED ORDER — CYCLOBENZAPRINE HCL 5 MG PO TABS: 5.0000 mg | ORAL_TABLET | Freq: Three times a day (TID) | ORAL | Status: AC | PRN

## 2011-01-07 MED ORDER — OLMESARTAN-AMLODIPINE-HCTZ 20-5-12.5 MG PO TABS: 1.0000 | ORAL_TABLET | ORAL | Status: DC

## 2011-01-07 MED ORDER — TRAMADOL HCL 50 MG PO TABS: 50.0000 mg | ORAL_TABLET | Freq: Three times a day (TID) | ORAL | Status: AC | PRN

## 2011-01-07 NOTE — Progress Notes (Signed)
  Subjective:    Patient ID: Scott Carson, male    DOB: 1970-03-25, 40 y.o.   MRN: [id number]  Back Pain This is a new problem. The current episode started in the past 7 days. The problem occurs constantly. The problem is unchanged. The pain is present in the lumbar spine and thoracic spine. The quality of the pain is described as aching. The pain does not radiate. The pain is at a severity of 6/10. The pain is moderate. The symptoms are aggravated by twisting, standing and sitting. Pertinent negatives include no abdominal pain, dysuria, fever or numbness.   Patients job is physical in nature.  He reports lifting and throwing pieces of sheet rock before symptoms started.    Review of Systems  Constitutional: Negative for fever.  Gastrointestinal: Negative for abdominal pain.  Genitourinary: Negative for dysuria.  Musculoskeletal: Positive for back pain.  Neurological: Negative for numbness.   Past Medical History  Diagnosis Date  . Hypertension   . Smoker   . Low back pain   . Eczema of hand   . Arthritis     BACK    History   Social History  . Marital Status: Married    Spouse Name: N/A    Number of Children: N/A  . Years of Education: N/A   Occupational History  . Not on file.   Social History Main Topics  . Smoking status: Current Everyday Smoker -- 1.0 packs/day    Types: Cigarettes  . Smokeless tobacco: Never Used   Comment: Counseling sheet given to quit smoking in exam room   . Alcohol Use: No  . Drug Use: No  . Sexually Active: Not on file   Other Topics Concern  . Not on file   Social History Narrative   MarriedAlcohol use-no   Current SmokerOccupation: Holiday representative         Past Surgical History  Procedure Date  . No past surgeries     denies surgical history  . Tonsillectomy     Family History  Problem Relation Age of Onset  . Hypertension Mother   . Diabetes Maternal Grandmother   . Stroke Maternal Grandmother   . Other Neg Hx     no  family history of colon cancer    No Known Allergies  Current Outpatient Prescriptions on File Prior to Visit  Medication Sig Dispense Refill  . triamcinolone (KENALOG) 0.5 % ointment Apply topically 2 (two) times daily.  60 g  5    BP 134/94  Pulse 91  Temp(Src) 98.1 F (36.7 C) (Oral)  Ht 6\' 1"  (1.854 m)  Wt 215 lb (97.523 kg)  BMI 28.37 kg/m2       Objective:   Physical Exam  Constitutional: He appears well-developed and well-nourished.  HENT:  Head: Normocephalic and atraumatic.  Cardiovascular: Normal rate, regular rhythm and normal heart sounds.   Pulmonary/Chest: Effort normal and breath sounds normal.  Abdominal: Soft. There is no tenderness.       Negative for CVA tenderness  Musculoskeletal:       Mild discomfort with Lumbar flexion and rotation Normal lower extremity muscle strength Patient able to squat, toe and heel walk without difficulty           Assessment & Plan:

## 2011-01-07 NOTE — Patient Instructions (Signed)
No heavy lifting for one week. Please call our office if your symptoms do not improve or gets worse.

## 2011-01-08 ENCOUNTER — Encounter: Payer: Self-pay | Admitting: Internal Medicine

## 2011-01-08 NOTE — Assessment & Plan Note (Addendum)
Pt with right sided back pain (thoracolumbar junction) I suspect muscle strain related to job activities.  Use muscle relaxer and tramadol as directed.  Pt warned re: sedative side effects.  Pt advised not to lift >15 lbs x 1 week.. Patient advised to call office if symptoms persist or worsen.

## 2011-01-08 NOTE — Assessment & Plan Note (Signed)
Good response to tribenzor. Continue same dose. BP: 134/94 mmHg

## 2011-01-12 ENCOUNTER — Other Ambulatory Visit: Payer: Self-pay | Admitting: Gastroenterology

## 2011-01-12 MED ORDER — AMOXICILL-CLARITHRO-LANSOPRAZ PO MISC: Freq: Two times a day (BID) | ORAL | Status: AC

## 2011-02-11 ENCOUNTER — Ambulatory Visit: Payer: BC Managed Care – PPO | Admitting: Internal Medicine

## 2011-02-24 ENCOUNTER — Telehealth: Payer: Self-pay | Admitting: *Deleted

## 2011-02-24 MED ORDER — AMLODIPINE-VALSARTAN-HCTZ 10-160-12.5 MG PO TABS: 1.0000 | ORAL_TABLET | Freq: Every day | ORAL | Status: DC

## 2011-02-24 NOTE — Telephone Encounter (Signed)
So would it be less expensive his insurance if we switched him back to exforge hctz?

## 2011-02-24 NOTE — Telephone Encounter (Signed)
Correct $20 for a 90 day supply

## 2011-02-24 NOTE — Telephone Encounter (Signed)
pts BP med cost $45 for a 90 days supply and he wants something cheaper.  He states that his old bp med cost $20 for a 90 day supply

## 2011-02-24 NOTE — Telephone Encounter (Signed)
Scott Carson.  He tried to use discount card but they wouldn't take it

## 2011-02-24 NOTE — Telephone Encounter (Signed)
Please clarify.  Is he referring to previous rx for exforge hctz?

## 2011-02-24 NOTE — Telephone Encounter (Signed)
Ok to switch to exforge hctz 160/10/12.5 mg  #90 with 1 refill

## 2011-02-24 NOTE — Telephone Encounter (Signed)
Pt aware.

## 2011-04-08 ENCOUNTER — Ambulatory Visit (INDEPENDENT_AMBULATORY_CARE_PROVIDER_SITE_OTHER): Payer: BC Managed Care – PPO | Admitting: Internal Medicine

## 2011-04-08 ENCOUNTER — Encounter: Payer: Self-pay | Admitting: Internal Medicine

## 2011-04-08 DIAGNOSIS — I10 Essential (primary) hypertension: Secondary | ICD-10-CM

## 2011-04-08 DIAGNOSIS — L239 Allergic contact dermatitis, unspecified cause: Secondary | ICD-10-CM

## 2011-04-08 DIAGNOSIS — L259 Unspecified contact dermatitis, unspecified cause: Secondary | ICD-10-CM

## 2011-04-08 MED ORDER — TRIAMCINOLONE ACETONIDE 0.5 % EX OINT: TOPICAL_OINTMENT | Freq: Two times a day (BID) | CUTANEOUS | Status: DC

## 2011-04-08 MED ORDER — AMLODIPINE-VALSARTAN-HCTZ 10-160-12.5 MG PO TABS: 1.0000 | ORAL_TABLET | Freq: Every day | ORAL | Status: DC

## 2011-04-08 NOTE — Patient Instructions (Signed)
Please complete the following lab tests before your next follow up appointment: BMET - 401.9 

## 2011-04-08 NOTE — Assessment & Plan Note (Signed)
Pt instructed to avoid contact with metal belt buckle by wearing undershirt.  Refilled triamcinolone ointment.

## 2011-04-08 NOTE — Progress Notes (Signed)
  Subjective:    Patient ID: Scott Carson, male    DOB: 1970/07/25, 41 y.o.   MRN: [id number]  HPI  41 year old [place] American male with hypertension for routine followup. Overall patient has been doing very well. His blood pressure medication was changed due to cost reasons.  His blood pressure has been well controlled. He has been able to quit smoking x 42 week using e-cigarettes.  He was previously seen for low back pain. Especially this has resolved. His employer has reassigned into position which much less heavy lifting.  Patient has persistent rash below his belly button. His area where his skin comes in contact with his belt buckle. He has run out of triamcinolone ointment.  Review of Systems Negative for chest pain, negative for shortness of breath    Past Medical History  Diagnosis Date  . Hypertension   . Smoker   . Low back pain   . Eczema of hand   . Arthritis     BACK    History   Social History  . Marital Status: Married    Spouse Name: N/A    Number of Children: N/A  . Years of Education: N/A   Occupational History  . Not on file.   Social History Main Topics  . Smoking status: Current Everyday Smoker -- 1.0 packs/day    Types: Cigarettes  . Smokeless tobacco: Never Used   Comment: Counseling sheet given to quit smoking in exam room   . Alcohol Use: No  . Drug Use: No  . Sexually Active: Not on file   Other Topics Concern  . Not on file   Social History Narrative   MarriedAlcohol use-no   Current SmokerOccupation: Holiday representative         Past Surgical History  Procedure Date  . No past surgeries     denies surgical history  . Tonsillectomy     Family History  Problem Relation Age of Onset  . Hypertension Mother   . Diabetes Maternal Grandmother   . Stroke Maternal Grandmother   . Other Neg Hx     no family history of colon cancer    No Known Allergies  Current Outpatient Prescriptions on File Prior to Visit  Medication Sig Dispense  Refill  . triamcinolone (KENALOG) 0.5 % ointment Apply topically 2 (two) times daily.  60 g  5    BP 120/84  Temp(Src) 98 F (36.7 C) (Oral)  Wt 215 lb (97.523 kg)    Objective:   Physical Exam  Constitutional: He appears well-developed and well-nourished.  Neck:       Negative for carotid bruit  Cardiovascular: Normal rate, regular rhythm and normal heart sounds.   Pulmonary/Chest: Effort normal and breath sounds normal. He has no wheezes. He has no rales.  Musculoskeletal: He exhibits no edema.  Skin:       Patch of dry scaly skin below umbilicus       Assessment & Plan:

## 2011-04-08 NOTE — Assessment & Plan Note (Signed)
Tribenzor changed to Exforge HCTZ due to cost reasons.  BP is stable.  Continue current dose.  Monitor BMET before next OV. BP: 120/84 mmHg

## 2011-06-17 ENCOUNTER — Ambulatory Visit: Payer: BC Managed Care – PPO | Admitting: Internal Medicine

## 2011-06-20 ENCOUNTER — Ambulatory Visit (INDEPENDENT_AMBULATORY_CARE_PROVIDER_SITE_OTHER): Payer: BC Managed Care – PPO | Admitting: Internal Medicine

## 2011-06-20 ENCOUNTER — Encounter: Payer: Self-pay | Admitting: Internal Medicine

## 2011-06-20 VITALS — BP 120/88 | HR 68 | Temp 98.7°F | Wt 217.0 lb

## 2011-06-20 DIAGNOSIS — M545 Low back pain, unspecified: Secondary | ICD-10-CM

## 2011-06-20 MED ORDER — CYCLOBENZAPRINE HCL 5 MG PO TABS: 5.0000 mg | ORAL_TABLET | Freq: Every day | ORAL | Status: AC | PRN

## 2011-06-20 NOTE — Progress Notes (Signed)
  Subjective:    Patient ID: Scott Carson, male    DOB: [birth year], 41 y.o.   MRN: [id number]  HPI  41 year old African American male with history of hypertension complains of left-sided low back pain. His job involves considerable amount of lifting. The symptoms started last Thursday after throwing sheet rock and various metal items. He also drove [place] distance to [place]. He describes aching sensation. No radiation. No lower extremity weakness.  Some relief with previous rx of tramadol.    Review of Systems Negative for radiation of pain,  Negative for leg weakness    Past Medical History  Diagnosis Date  . Hypertension   . Smoker   . Low back pain   . Eczema of hand   . Arthritis     BACK    History   Social History  . Marital Status: Married    Spouse Name: N/A    Number of Children: N/A  . Years of Education: N/A   Occupational History  . Not on file.   Social History Main Topics  . Smoking status: Current Everyday Smoker -- 1.0 packs/day    Types: Cigarettes  . Smokeless tobacco: Never Used   Comment: Counseling sheet given to quit smoking in exam room   . Alcohol Use: No  . Drug Use: No  . Sexually Active: Not on file   Other Topics Concern  . Not on file   Social History Narrative   MarriedAlcohol use-no   Current SmokerOccupation: Holiday representative         Past Surgical History  Procedure Date  . No past surgeries     denies surgical history  . Tonsillectomy     Family History  Problem Relation Age of Onset  . Hypertension Mother   . Diabetes Maternal Grandmother   . Stroke Maternal Grandmother   . Other Neg Hx     no family history of colon cancer    No Known Allergies  Current Outpatient Prescriptions on File Prior to Visit  Medication Sig Dispense Refill  . Amlodipine-Valsartan-HCTZ 10-160-12.5 MG TABS Take 1 tablet by mouth daily.  90 tablet  1  . triamcinolone ointment (KENALOG) 0.5 % Apply topically 2 (two) times daily.  60 g  5     BP 120/88  Pulse 68  Temp(Src) 98.7 F (37.1 C) (Oral)  Wt 217 lb (98.431 kg)    Objective:   Physical Exam  Constitutional: He is oriented to person, place, and time. He appears well-developed and well-nourished.  HENT:  Head: Normocephalic and atraumatic.  Cardiovascular: Regular rhythm and normal heart sounds.   Pulmonary/Chest: Effort normal and breath sounds normal. He has no wheezes.  Musculoskeletal:       Mild discomfort with lumbar flexion.  Negative straight leg raise test bilaterally.  Neurological: He is alert and oriented to person, place, and time. He has normal reflexes.  Skin: Skin is warm and dry.       Assessment & Plan:

## 2011-06-20 NOTE — Assessment & Plan Note (Signed)
41 year old [place] American male with symptoms of left-sided lumbar strain. Use cyclobenzaprine as directed. No heavy lifting for at least one week.  Patient advised to call office if symptoms persist or worsen.

## 2011-06-20 NOTE — Patient Instructions (Signed)
Please call our office if your symptoms do not improve or gets worse.  

## 2011-09-20 ENCOUNTER — Ambulatory Visit (INDEPENDENT_AMBULATORY_CARE_PROVIDER_SITE_OTHER): Payer: BC Managed Care – PPO | Admitting: Internal Medicine

## 2011-09-20 ENCOUNTER — Encounter: Payer: Self-pay | Admitting: Internal Medicine

## 2011-09-20 VITALS — BP 128/88 | HR 96 | Temp 98.3°F | Ht 69.0 in | Wt 215.0 lb

## 2011-09-20 DIAGNOSIS — M545 Low back pain, unspecified: Secondary | ICD-10-CM

## 2011-09-20 MED ORDER — METHOCARBAMOL 500 MG PO TABS: 500.0000 mg | ORAL_TABLET | Freq: Three times a day (TID) | ORAL | Status: AC | PRN

## 2011-09-20 NOTE — Patient Instructions (Addendum)
Consider using massage therapy for your low back pain.

## 2011-09-20 NOTE — Progress Notes (Signed)
Subjective:    Patient ID: Scott Carson, male    DOB: 03-10-1970, 41 y.o.   MRN: [id number]  HPI  41 year old African American male complains of chronic low back pain. Symptoms have been on and off since he was last seen in April of 2013. He complains of stiff sensation that is worse in the morning. His low back pain is also worse with prolonged sitting/driving. Symptoms are usually 5-6/10 but over the last couple of days his low back pain has been rated 8/10. Symptoms can slightly radiate to left upper leg. He denies any lower extremity weakness.  Last MRI of lumbar spine was completed in 2007.  It showed:  1. Mild disc bulge at L4-5 without central canal narrowing. The patient does have some biforaminal narrowing at this level.  2. Mild facet arthropathy L4-5 and L5-S1   Review of Systems No fever or chills  Past Medical History  Diagnosis Date  . Hypertension   . Smoker   . Low back pain   . Eczema of hand   . Arthritis     BACK    History   Social History  . Marital Status: Married    Spouse Name: N/A    Number of Children: N/A  . Years of Education: N/A   Occupational History  . Not on file.   Social History Main Topics  . Smoking status: Current Everyday Smoker -- 1.0 packs/day    Types: Cigarettes  . Smokeless tobacco: Never Used   Comment: Counseling sheet given to quit smoking in exam room   . Alcohol Use: No  . Drug Use: No  . Sexually Active: Not on file   Other Topics Concern  . Not on file   Social History Narrative   MarriedAlcohol use-no   Current SmokerOccupation: Holiday representative         Past Surgical History  Procedure Date  . No past surgeries     denies surgical history  . Tonsillectomy     Family History  Problem Relation Age of Onset  . Hypertension Mother   . Diabetes Maternal Grandmother   . Stroke Maternal Grandmother   . Other Neg Hx     no family history of colon cancer    No Known Allergies  Current Outpatient  Prescriptions on File Prior to Visit  Medication Sig Dispense Refill  . Amlodipine-Valsartan-HCTZ 10-160-12.5 MG TABS Take 1 tablet by mouth daily.  90 tablet  1  . traMADol (ULTRAM) 50 MG tablet Take 50 mg by mouth every 8 (eight) hours as needed.      . triamcinolone ointment (KENALOG) 0.5 % Apply topically 2 (two) times daily.  60 g  5    BP 128/88  Pulse 96  Temp 98.3 F (36.8 C) (Oral)  Ht 5\' 9"  (1.753 m)  Wt 215 lb (97.523 kg)  BMI 31.75 kg/m2       Objective:   Physical Exam  Constitutional: He is oriented to person, place, and time. He appears well-developed and well-nourished.  HENT:  Head: Normocephalic and atraumatic.  Right Ear: External ear normal.  Left Ear: External ear normal.  Cardiovascular: Normal rate, regular rhythm and normal heart sounds.   No murmur heard. Pulmonary/Chest: Effort normal and breath sounds normal. He has no wheezes. He has no rales.  Musculoskeletal: He exhibits no edema.       Patient able to squat without difficulty  Neurological: He is oriented to person, place, and time. He has normal reflexes.  Coordination normal.  Skin: Skin is warm and dry.  Psychiatric: He has a normal mood and affect. His behavior is normal.       Assessment & Plan:

## 2011-09-20 NOTE — Assessment & Plan Note (Signed)
41 year old [place] American male with chronic low back pain. I suspect symptoms are secondary to lumbar strain and also component of lumbar spondylosis. His job requires regular lifting. This is unavoidable. I suggest he continue to use muscle relaxers as needed. Patient also can try massage therapy.

## 2011-09-26 ENCOUNTER — Other Ambulatory Visit: Payer: Self-pay | Admitting: Internal Medicine

## 2011-09-27 ENCOUNTER — Other Ambulatory Visit: Payer: Self-pay

## 2011-09-27 MED ORDER — AMLODIPINE-VALSARTAN-HCTZ 10-160-12.5 MG PO TABS: 1.0000 | ORAL_TABLET | Freq: Every day | ORAL | Status: DC

## 2011-10-07 ENCOUNTER — Ambulatory Visit: Payer: BC Managed Care – PPO | Admitting: Internal Medicine

## 2011-11-18 ENCOUNTER — Ambulatory Visit (INDEPENDENT_AMBULATORY_CARE_PROVIDER_SITE_OTHER): Payer: BC Managed Care – PPO

## 2011-11-18 DIAGNOSIS — Z23 Encounter for immunization: Secondary | ICD-10-CM

## 2011-11-22 ENCOUNTER — Telehealth: Payer: Self-pay | Admitting: Internal Medicine

## 2011-11-22 ENCOUNTER — Telehealth: Payer: Self-pay

## 2011-11-22 MED ORDER — VARENICLINE TARTRATE 0.5 MG PO TABS: 0.5000 mg | ORAL_TABLET | Freq: Two times a day (BID) | ORAL | Status: DC

## 2011-11-22 NOTE — Telephone Encounter (Signed)
Caller: Scott Carson/Patient; Phone: [phone]; Reason for Call: Patient calling to see if Dr.  Artist Pais would write a prescription for medication to help him stop smoking.  Please call him back.  Thanks

## 2011-11-22 NOTE — Telephone Encounter (Signed)
Ok for rx for starter pack for Chantix.  I suggest OV within 1 month of starting medication

## 2011-11-22 NOTE — Telephone Encounter (Signed)
[facility] Triage Call Report Triage Record Num: [id number] Operator: Aundra Millet Patient Name: Scott Carson Call Date & Time: 11/21/2011 5:17:11PM Patient Phone: [phone] PCP: Thomos Lemons Patient Gender: Male PCP Fax : [phone] Patient DOB: 05-16-1970 [facility] Reason for Call: Caller: [name]; PCP: Ethelene Hal [name]) (Adults only); CB#: [phone]; Call regarding Smoking cessation ; Today, 11/21/2011 ,pt calling wanting to know if Dr. Artist Pais would call in Chantix to help him stop smoking. Pt has tried Chantix in the past, but stopped because of bad dreams, but he is interested in trying again. Pt has next appt scheduled with Dr. Artist Pais for January 2014. RN advised to call office tomorrow when they reopen to inquire about rx. Protocol(s) Used: Office Note Recommended Outcome per Protocol: Information Noted and Sent to Office Reason for Outcome: Caller information to office Care Advice: ~

## 2011-11-22 NOTE — Telephone Encounter (Signed)
rx sent in electronically, pt aware, appt scheduled

## 2011-11-28 ENCOUNTER — Encounter: Payer: Self-pay | Admitting: Gastroenterology

## 2011-11-28 ENCOUNTER — Ambulatory Visit (INDEPENDENT_AMBULATORY_CARE_PROVIDER_SITE_OTHER): Payer: BC Managed Care – PPO | Admitting: Gastroenterology

## 2011-11-28 VITALS — BP 100/80 | HR 88 | Ht 69.0 in | Wt 207.2 lb

## 2011-11-28 DIAGNOSIS — R1310 Dysphagia, unspecified: Secondary | ICD-10-CM

## 2011-11-28 MED ORDER — OMEPRAZOLE MAGNESIUM 20 MG PO TBEC: 20.0000 mg | DELAYED_RELEASE_TABLET | Freq: Every day | ORAL | Status: DC

## 2011-11-28 NOTE — Progress Notes (Signed)
Review of pertinent gastrointestinal problems: 1. Schatzki's ring, dilated to 18 mm November 2012 upper endoscopy; presented with dysphasia 2. H. pylori positive gastritis, biopsy proven on 2012 upper endoscopy, treated appropriately.   HPI: This is a   very pleasant 41 year old man whom I last saw November 2012.  Swallowing was normal for 8-9 months, then dysphagia returned.  Overall stable weight.  Sometimes has dislodge with vomiting.  Chewing very carefully.   Did complete the h pylori abx.  No overt bleeding.  Has no pyrosis, gerd sypmtoms and so takes no ppi.    Past Medical History  Diagnosis Date  . Hypertension   . Smoker   . Low back pain   . Eczema of hand   . Arthritis     BACK    Past Surgical History  Procedure Date  . No past surgeries     denies surgical history  . Tonsillectomy     Current Outpatient Prescriptions  Medication Sig Dispense Refill  . Amlodipine-Valsartan-HCTZ 10-160-12.5 MG TABS Take 1 tablet by mouth daily.  90 tablet  1  . varenicline (CHANTIX) 0.5 MG tablet Take 1 tablet (0.5 mg total) by mouth 2 (two) times daily.  30 tablet  0  . DISCONTD: EXFORGE HCT 10-160-12.5 MG TABS TAKE 1 TABLET BY MOUTH DAILY.  90 tablet  1    Allergies as of 11/28/2011  . (No Known Allergies)    Family History  Problem Relation Age of Onset  . Hypertension Mother   . Diabetes Maternal Grandmother   . Stroke Maternal Grandmother   . Other Neg Hx     no family history of colon cancer    History   Social History  . Marital Status: Married    Spouse Name: N/A    Number of Children: N/A  . Years of Education: N/A   Occupational History  . Not on file.   Social History Main Topics  . Smoking status: Current Every Day Smoker -- 1.0 packs/day    Types: Cigarettes  . Smokeless tobacco: Never Used   Comment: Counseling sheet given to quit smoking in exam room   . Alcohol Use: No  . Drug Use: No  . Sexually Active: Not on file   Other Topics  Concern  . Not on file   Social History Narrative   MarriedAlcohol use-no   Current SmokerOccupation: Holiday representative           Physical Exam: BP 100/80  Pulse 88  Ht 5\' 9"  (1.753 m)  Wt 207 lb 3.2 oz (93.985 kg)  BMI 30.60 kg/m2 Constitutional: generally well-appearing Psychiatric: alert and oriented x3 Abdomen: soft, nontender, nondistended, no obvious ascites, no peritoneal signs, normal bowel sounds     Assessment and plan: 41 y.o. male with recurrent dysphagia to solid foods  Likely this is from a Schatzki's ring that I saw about a year ago on endoscopy. He had complete relief of his dysphagia symptoms for about 9 months but the symptoms have returned. We will repeat EGD and plan to dilate again. He also had H. pylori gastritis and if he has any gastritis again on this next examination I'll repeat biopsy check for eradication.

## 2011-11-28 NOTE — Patient Instructions (Addendum)
You will be set up for an upper endoscopy with likely balloon dilation for dysphagia. Samples of PPI given, take one pill once a day before your dinner meal.

## 2011-12-02 ENCOUNTER — Encounter: Payer: Self-pay | Admitting: Gastroenterology

## 2011-12-02 ENCOUNTER — Ambulatory Visit (AMBULATORY_SURGERY_CENTER): Payer: BC Managed Care – PPO | Admitting: Gastroenterology

## 2011-12-02 VITALS — BP 136/91 | HR 81 | Temp 97.7°F | Resp 23 | Ht 69.0 in | Wt 207.0 lb

## 2011-12-02 DIAGNOSIS — R1310 Dysphagia, unspecified: Secondary | ICD-10-CM

## 2011-12-02 DIAGNOSIS — K222 Esophageal obstruction: Secondary | ICD-10-CM

## 2011-12-02 MED ORDER — SODIUM CHLORIDE 0.9 % IV SOLN: 500.0000 mL | INTRAVENOUS | Status: DC

## 2011-12-02 NOTE — Progress Notes (Signed)
Patient did not experience any of the following events: a burn prior to discharge; a fall within the facility; wrong site/side/patient/procedure/implant event; or a hospital transfer or hospital admission upon discharge from the facility. (G8907) Patient did not have preoperative order for IV antibiotic SSI prophylaxis. (G8918)  

## 2011-12-02 NOTE — Op Note (Signed)
[facility] [address] [place], [address]   ENDOSCOPY PROCEDURE REPORT  PATIENT: Scott Carson, Scott Carson  MR#: [id number] BIRTHDATE: 03-01-1970 , 41  yrs. old GENDER: Male ENDOSCOPIST: Rachael Fee, MD REFERRED BY: PROCEDURE DATE:  12/02/2011 PROCEDURE:  EGD, diagnostic ASA CLASS:     Class II INDICATIONS:  dysphagia, 2012 Schatzki's ring dilated. MEDICATIONS: Fentanyl 75 mcg IV, Versed 6 mg IV, and These medications were titrated to patient response per physician's verbal order TOPICAL ANESTHETIC: Cetacaine Spray  DESCRIPTION OF PROCEDURE: After the risks benefits and alternatives of the procedure were thoroughly explained, informed consent was obtained.  The LB-GIF Q180 [id number] endoscope was introduced through the mouth and advanced to the second portion of the duodenum. Without limitations.  The instrument was slowly withdrawn as the mucosa was fully examined.  There was a Schatzki's ring at [employer] junction.  This was dilated with a 20mm CRE TTS balloon held inflated for 60 seconds.  There was the usual minor mucosal tear and self limited oozing following dilation.  The examination was otherwise normal.  Retroflexed views revealed no abnormalities.     The scope was then withdrawn from the patient and the procedure completed. COMPLICATIONS: There were no complications. ENDOSCOPIC IMPRESSION: There was a Schatzki's ring at GE junction, dilated. The examination was otherwise normal.  RECOMMENDATIONS: Follow clinically    eSigned:  Rachael Fee, MD 12/02/2011 4:13 PM

## 2011-12-02 NOTE — Patient Instructions (Addendum)
One of your biggest health concerns is your smoking.  This increases your risk for most cancers and serious cardiovascular diseases such as strokes, heart attacks.  You should try your best to stop.  If you need assistance, please contact your PCP or Smoking Cessation Class at [facility] [phone]) or [facility] Quit-Line (1-800-QUIT-NOW). Discharge instructions given with verbal understanding. Dilatation diet given. Resume previous medications. YOU HAD AN ENDOSCOPIC PROCEDURE TODAY AT THE [facility]: Refer to the procedure report that was given to you for any specific questions about what was found during the examination.  If the procedure report does not answer your questions, please call your gastroenterologist to clarify.  If you requested that your care partner not be given the details of your procedure findings, then the procedure report has been included in a sealed envelope for you to review at your convenience later.  YOU SHOULD EXPECT: Some feelings of bloating in the abdomen. Passage of more gas than usual.  Walking can help get rid of the air that was put into your GI tract during the procedure and reduce the bloating. If you had a lower endoscopy (such as a colonoscopy or flexible sigmoidoscopy) you may notice spotting of blood in your stool or on the toilet paper. If you underwent a bowel prep for your procedure, then you may not have a normal bowel movement for a few days.  DIET: Your first meal following the procedure should be a light meal and then it is ok to progress to your normal diet.  A half-sandwich or bowl of soup is an example of a good first meal.  Heavy or fried foods are harder to digest and may make you feel nauseous or bloated.  Likewise meals heavy in dairy and vegetables can cause extra gas to form and this can also increase the bloating.  Drink plenty of fluids but you should avoid alcoholic beverages for 24 hours.  ACTIVITY: Your care partner should  take you home directly after the procedure.  You should plan to take it easy, moving slowly for the rest of the day.  You can resume normal activity the day after the procedure however you should NOT DRIVE or use heavy machinery for 24 hours (because of the sedation medicines used during the test).    SYMPTOMS TO REPORT IMMEDIATELY: A gastroenterologist can be reached at any hour.  During normal business hours, 8:30 AM to 5:00 PM Monday through Friday, call [phone].  After hours and on weekends, please call the GI answering service at [phone] who will take a message and have the physician on call contact you.   Following upper endoscopy (EGD)  Vomiting of blood or coffee ground material  New chest pain or pain under the shoulder blades  Painful or persistently difficult swallowing  New shortness of breath  Fever of 100F or higher  Black, tarry-looking stools  FOLLOW UP: If any biopsies were taken you will be contacted by phone or by letter within the next 1-3 weeks.  Call your gastroenterologist if you have not heard about the biopsies in 3 weeks.  Our staff will call the home number listed on your records the next business day following your procedure to check on you and address any questions or concerns that you may have at that time regarding the information given to you following your procedure. This is a courtesy call and so if there is no answer at the home number and we have not heard  from you through the emergency physician on call, we will assume that you have returned to your regular daily activities without incident.  SIGNATURES/CONFIDENTIALITY: You and/or your care partner have signed paperwork which will be entered into your electronic medical record.  These signatures attest to the fact that that the information above on your After Visit Summary has been reviewed and is understood.  Full responsibility of the confidentiality of this discharge information lies with you  and/or your care-partner.

## 2011-12-05 ENCOUNTER — Telehealth: Payer: Self-pay

## 2011-12-05 NOTE — Telephone Encounter (Signed)
  Follow up Call-  Call back number 12/02/2011 01/04/2011  Post procedure Call Back phone  # [phone]  Permission to leave phone message Yes -     Patient questions:  Do you have a fever, pain , or abdominal swelling? no Pain Score  0 *  Have you tolerated food without any problems? yes  Have you been able to return to your normal activities? yes  Do you have any questions about your discharge instructions: Diet   no Medications  no Follow up visit  no  Do you have questions or concerns about your Care? no  Actions: * If pain score is 4 or above: No action needed, pain <4.

## 2011-12-15 ENCOUNTER — Other Ambulatory Visit: Payer: Self-pay | Admitting: Internal Medicine

## 2011-12-23 ENCOUNTER — Encounter: Payer: Self-pay | Admitting: Internal Medicine

## 2011-12-23 ENCOUNTER — Ambulatory Visit (INDEPENDENT_AMBULATORY_CARE_PROVIDER_SITE_OTHER): Payer: BC Managed Care – PPO | Admitting: Internal Medicine

## 2011-12-23 VITALS — BP 120/80 | HR 68 | Temp 97.8°F | Wt 214.0 lb

## 2011-12-23 DIAGNOSIS — F17200 Nicotine dependence, unspecified, uncomplicated: Secondary | ICD-10-CM

## 2011-12-23 DIAGNOSIS — R0781 Pleurodynia: Secondary | ICD-10-CM

## 2011-12-23 DIAGNOSIS — R634 Abnormal weight loss: Secondary | ICD-10-CM

## 2011-12-23 DIAGNOSIS — I10 Essential (primary) hypertension: Secondary | ICD-10-CM

## 2011-12-23 DIAGNOSIS — R079 Chest pain, unspecified: Secondary | ICD-10-CM

## 2011-12-23 MED ORDER — VARENICLINE TARTRATE 1 MG PO TABS: 1.0000 mg | ORAL_TABLET | Freq: Two times a day (BID) | ORAL | Status: DC

## 2011-12-23 NOTE — Assessment & Plan Note (Signed)
Well controlled. No medication change.  BP: 120/80 mmHg  Lab Results  Component Value Date   CREATININE 1.0 11/05/2010

## 2011-12-23 NOTE — Progress Notes (Signed)
Subjective:    Patient ID: Scott Carson, male    DOB: 28-Jun-1970, 41 y.o.   MRN: [id number]  HPI  41 year old African American male with history of hypertension, tobacco abuse and chronic low back pain for follow up. Overall patient has been doing well. He continues to lose weight unintentionally. He notes decrease in appetite. He was recently seen by gastroenterologist for dysphasia. Patient found to have esophageal stricture which was dilated.  His previous thyroid testing was normal.   Lab Results  Component Value Date   TSH 0.52 11/05/2010     Hypertension-patient tolerating blood pressure medication well. He denies dizziness or lightheadedness.  Tobacco use-patient currently using 0.5 mg a Chantix twice daily. He is down to one half pack per day but unable to quit completely. He complains of intermittent right rib pain of unclear etiology.  Review of Systems No change in bowel habits,  No cough or SOB  Past Medical History  Diagnosis Date  . Hypertension   . Smoker   . Low back pain   . Eczema of hand   . Arthritis     BACK    History   Social History  . Marital Status: Married    Spouse Name: N/A    Number of Children: N/A  . Years of Education: N/A   Occupational History  . Not on file.   Social History Main Topics  . Smoking status: Current Every Day Smoker -- 0.5 packs/day    Types: Cigarettes  . Smokeless tobacco: Never Used   Comment: Counseling sheet given to quit smoking in exam room   . Alcohol Use: No     occasionaly  . Drug Use: No  . Sexually Active: Not on file   Other Topics Concern  . Not on file   Social History Narrative   MarriedAlcohol use-no   Current SmokerOccupation: Holiday representative         Past Surgical History  Procedure Date  . Tonsillectomy     Family History  Problem Relation Age of Onset  . Hypertension Mother   . Diabetes Maternal Grandmother   . Stroke Maternal Grandmother   . Other Neg Hx     no family history of  colon cancer  . Colon cancer Paternal Grandfather     No Known Allergies  Current Outpatient Prescriptions on File Prior to Visit  Medication Sig Dispense Refill  . Amlodipine-Valsartan-HCTZ 10-160-12.5 MG TABS Take 1 tablet by mouth daily.  90 tablet  1  . omeprazole (PRILOSEC OTC) 20 MG tablet Take 1 tablet (20 mg total) by mouth daily.  12 tablet  0    BP 120/80  Pulse 68  Temp 97.8 F (36.6 C) (Oral)  Wt 214 lb (97.07 kg)       Objective:   Physical Exam  Constitutional: He is oriented to person, place, and time. He appears well-developed and well-nourished.  HENT:  Head: Normocephalic and atraumatic.  Mouth/Throat: Oropharynx is clear and moist.  Neck: Neck supple. No thyromegaly present.  Cardiovascular: Normal rate, regular rhythm and normal heart sounds.   Pulmonary/Chest: Effort normal and breath sounds normal. He has no wheezes.       Mild right chest wall tenderness  Abdominal: Soft. Bowel sounds are normal. He exhibits no mass. There is no tenderness.  Genitourinary: Rectum normal, prostate normal and penis normal. Guaiac negative stool.  Musculoskeletal: He exhibits no edema.  Lymphadenopathy:    He has no cervical adenopathy.  Neurological: He is  alert and oriented to person, place, and time. No cranial nerve deficit.  Psychiatric: He has a normal mood and affect. His behavior is normal.          Assessment & Plan:

## 2011-12-23 NOTE — Assessment & Plan Note (Signed)
EGD revealed esophageal stricture. This was dilated. He still has ongoing weight loss. Considering long history of tobacco use obtain chest x-ray.

## 2011-12-23 NOTE — Assessment & Plan Note (Signed)
Increase chantix to 1 mg twice daily.

## 2011-12-23 NOTE — Patient Instructions (Addendum)
Our office will contact you re: chest x ray results.

## 2011-12-27 ENCOUNTER — Ambulatory Visit (INDEPENDENT_AMBULATORY_CARE_PROVIDER_SITE_OTHER)
Admission: RE | Admit: 2011-12-27 | Discharge: 2011-12-27 | Disposition: A | Discharge status: Home / Self Care | Payer: BC Managed Care – PPO | Source: Ambulatory Visit | Attending: Internal Medicine | Admitting: Internal Medicine

## 2011-12-27 DIAGNOSIS — R0781 Pleurodynia: Secondary | ICD-10-CM

## 2011-12-27 DIAGNOSIS — R079 Chest pain, unspecified: Secondary | ICD-10-CM

## 2011-12-27 DIAGNOSIS — R634 Abnormal weight loss: Secondary | ICD-10-CM

## 2012-02-28 ENCOUNTER — Ambulatory Visit (INDEPENDENT_AMBULATORY_CARE_PROVIDER_SITE_OTHER): Payer: BC Managed Care – PPO | Admitting: Internal Medicine

## 2012-02-28 ENCOUNTER — Encounter: Payer: Self-pay | Admitting: Internal Medicine

## 2012-02-28 VITALS — BP 124/90 | Temp 98.0°F | Wt 216.0 lb

## 2012-02-28 DIAGNOSIS — M545 Low back pain, unspecified: Secondary | ICD-10-CM

## 2012-02-28 MED ORDER — TRAMADOL HCL 50 MG PO TABS: 50.0000 mg | ORAL_TABLET | Freq: Three times a day (TID) | ORAL | Status: DC | PRN

## 2012-02-28 MED ORDER — METHOCARBAMOL 500 MG PO TABS: 500.0000 mg | ORAL_TABLET | Freq: Three times a day (TID) | ORAL | Status: DC | PRN

## 2012-02-28 NOTE — Progress Notes (Signed)
  Subjective:    Patient ID: Scott Carson, male    DOB: 12/25/1970, 42 y.o.   MRN: [id number]  HPI  42 year old African American male with history of hypertension complains of bilateral low back pain. Symptoms started her after Christmas holiday. He rates severity as 8/10. Symptoms worse with lifting or bending forward. Back pain got worse when he was playing with his children.  Patient having difficulty performing duties at work.  He denies any lower extremity weakness. No radiation of pain.  Review of Systems Negative for fever chills     Past Medical History  Diagnosis Date  . Hypertension   . Smoker   . Low back pain   . Eczema of hand   . Arthritis     BACK    History   Social History  . Marital Status: Married    Spouse Name: N/A    Number of Children: N/A  . Years of Education: N/A   Occupational History  . Not on file.   Social History Main Topics  . Smoking status: Current Every Day Smoker -- 0.5 packs/day    Types: Cigarettes  . Smokeless tobacco: Never Used     Comment: Counseling sheet given to quit smoking in exam room   . Alcohol Use: No     Comment: occasionaly  . Drug Use: No  . Sexually Active: Not on file   Other Topics Concern  . Not on file   Social History Narrative   MarriedAlcohol use-no   Current SmokerOccupation: Holiday representative         Past Surgical History  Procedure Date  . Tonsillectomy     Family History  Problem Relation Age of Onset  . Hypertension Mother   . Diabetes Maternal Grandmother   . Stroke Maternal Grandmother   . Other Neg Hx     no family history of colon cancer  . Colon cancer Paternal Grandfather     No Known Allergies  Current Outpatient Prescriptions on File Prior to Visit  Medication Sig Dispense Refill  . Amlodipine-Valsartan-HCTZ 10-160-12.5 MG TABS Take 1 tablet by mouth daily.  90 tablet  1  . omeprazole (PRILOSEC OTC) 20 MG tablet Take 1 tablet (20 mg total) by mouth daily.  12 tablet  0  .  varenicline (CHANTIX) 1 MG tablet Take 1 tablet (1 mg total) by mouth 2 (two) times daily.  60 tablet  3    BP 124/90  Temp 98 F (36.7 C) (Oral)  Wt 216 lb (97.977 kg)    Objective:   Physical Exam  Constitutional: He is oriented to person, place, and time. He appears well-developed and well-nourished.  Cardiovascular: Normal rate, regular rhythm and normal heart sounds.   Pulmonary/Chest: Effort normal and breath sounds normal. He has no wheezes.  Musculoskeletal: He exhibits no edema.  Neurological: He is oriented to person, place, and time. He displays normal reflexes. No cranial nerve deficit. He exhibits normal muscle tone.       Patient able to squat, toe and heel walk without difficulty Decreased range of motion right leg (tight hamstrings) Left on left sacral dysfunction Somatic dysfunction T11-12 left side          Assessment & Plan:

## 2012-02-28 NOTE — Assessment & Plan Note (Signed)
42 year old [place] American male with exacerbation of low back pain. Patient was on vacation over the holidays. Patient was playing with his kids and lifting his kids when exacerbation occurred. No sign of lumbar radiculopathy. Lower extremity muscle strength is normal. Patient likely has lumbar strain and/or lumbar spondylosis. Utilized myofascial release techniques and HVLA lumbosacral and thoracic spine. Patient tolerated well. Reviewed home exercises.  Reassess in 2 weeks. Use Robaxin and tramadol as directed.

## 2012-03-13 ENCOUNTER — Ambulatory Visit: Payer: BC Managed Care – PPO | Admitting: Internal Medicine

## 2012-03-22 ENCOUNTER — Ambulatory Visit: Payer: BC Managed Care – PPO | Admitting: Internal Medicine

## 2012-03-26 ENCOUNTER — Ambulatory Visit (INDEPENDENT_AMBULATORY_CARE_PROVIDER_SITE_OTHER): Payer: BC Managed Care – PPO | Admitting: Internal Medicine

## 2012-03-31 ENCOUNTER — Other Ambulatory Visit: Payer: Self-pay | Admitting: Internal Medicine

## 2012-04-02 ENCOUNTER — Ambulatory Visit: Payer: BC Managed Care – PPO | Admitting: Internal Medicine

## 2012-04-05 ENCOUNTER — Ambulatory Visit: Payer: BC Managed Care – PPO | Admitting: Internal Medicine

## 2012-04-06 ENCOUNTER — Ambulatory Visit: Payer: BC Managed Care – PPO | Admitting: Internal Medicine

## 2012-04-09 ENCOUNTER — Ambulatory Visit (INDEPENDENT_AMBULATORY_CARE_PROVIDER_SITE_OTHER): Payer: BC Managed Care – PPO | Admitting: Internal Medicine

## 2012-04-09 ENCOUNTER — Encounter: Payer: Self-pay | Admitting: Internal Medicine

## 2012-04-09 VITALS — BP 122/80 | HR 91 | Temp 98.2°F | Wt 219.0 lb

## 2012-04-09 DIAGNOSIS — M7552 Bursitis of left shoulder: Secondary | ICD-10-CM | POA: Insufficient documentation

## 2012-04-09 DIAGNOSIS — M545 Low back pain, unspecified: Secondary | ICD-10-CM

## 2012-04-09 DIAGNOSIS — F17200 Nicotine dependence, unspecified, uncomplicated: Secondary | ICD-10-CM

## 2012-04-09 DIAGNOSIS — M67919 Unspecified disorder of synovium and tendon, unspecified shoulder: Secondary | ICD-10-CM

## 2012-04-09 MED ORDER — VARENICLINE TARTRATE 0.5 MG X 11 & 1 MG X 42 PO MISC: ORAL | Status: DC

## 2012-04-09 MED ORDER — METHOCARBAMOL 500 MG PO TABS: 500.0000 mg | ORAL_TABLET | Freq: Three times a day (TID) | ORAL | Status: DC | PRN

## 2012-04-09 MED ORDER — VARENICLINE TARTRATE 1 MG PO TABS: 1.0000 mg | ORAL_TABLET | Freq: Two times a day (BID) | ORAL | Status: DC

## 2012-04-09 NOTE — Assessment & Plan Note (Signed)
The patient has chronic low back pain. His symptoms exacerbated by nature of his work. Patient advised to use tramadol sparingly and Robaxin as needed. He has tried physical therapy in the past. He also tried epidural cortisone injection.  I encouraged home exercises.

## 2012-04-09 NOTE — Assessment & Plan Note (Signed)
Patient would like to retry chantix.

## 2012-04-09 NOTE — Assessment & Plan Note (Signed)
Patient reports intermittent left shoulder pain for several weeks. He has pain with laying on left side. Range of motion is normal. I doubt rotator cuff injury. Symptoms consistent with shoulder bursitis. I discussed risk and benefits of steroid injection to shoulder bursa. Injected 40 mg of Depo-Medrol and 1 cc of 1% lidocaine. Sterile technique maintained.

## 2012-04-09 NOTE — Progress Notes (Signed)
Subjective:    Patient ID: Scott Carson, male    DOB: 07-20-1970, 42 y.o.   MRN: [id number]  HPI  42 year old African American male for followup regarding low back pain. Patient continues to have intermittent symptoms. Symptoms worse with prolonged sitting or lifting. 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 Patient is currently using tramadol 50 mg every other day and Robaxin 500 mg once twice per week. He previously tried physical therapy. He is also tried lumbar epidural steroid injection several years ago.  Patient also complains of intermittent left shoulder pain. His symptoms have been ongoing for several weeks. He denies any specific injury or trauma. He has difficulty with raising left arm. He denies any weakness in his biceps her forearms or hands.  Review of Systems Negative for left forearm or hand weakness,  No neck injury  Past Medical History  Diagnosis Date  . Hypertension   . Smoker   . Low back pain   . Eczema of hand   . Arthritis     BACK    History   Social History  . Marital Status: Married    Spouse Name: N/A    Number of Children: N/A  . Years of Education: N/A   Occupational History  . Not on file.   Social History Main Topics  . Smoking status: Current Every Day Smoker -- 0.50 packs/day    Types: Cigarettes  . Smokeless tobacco: Never Used     Comment: Counseling sheet given to quit smoking in exam room   . Alcohol Use: No     Comment: occasionaly  . Drug Use: No  . Sexually Active: Not on file   Other Topics Concern  . Not on file   Social History Narrative   Married   Alcohol use-no      Current Smoker   Occupation: Holiday representative            Past Surgical History  Procedure Laterality Date  . Tonsillectomy      Family History  Problem Relation Age of Onset  . Hypertension Mother   . Diabetes Maternal Grandmother   . Stroke Maternal Grandmother   . Other Neg Hx     no family history of colon cancer   . Colon cancer Paternal Grandfather     No Known Allergies  Current Outpatient Prescriptions on File Prior to Visit  Medication Sig Dispense Refill  . Amlodipine-Valsartan-HCTZ 10-160-12.5 MG TABS Take 1 tablet by mouth daily.  90 tablet  1  . EXFORGE HCT 10-160-12.5 MG TABS TAKE 1 TABLET BY MOUTH DAILY.  90 tablet  1  . traMADol (ULTRAM) 50 MG tablet Take 1 tablet (50 mg total) by mouth every 8 (eight) hours as needed for pain.  30 tablet  0  . omeprazole (PRILOSEC OTC) 20 MG tablet Take 1 tablet (20 mg total) by mouth daily.  12 tablet  0  . varenicline (CHANTIX) 1 MG tablet Take 1 tablet (1 mg total) by mouth 2 (two) times daily.  60 tablet  3   No current facility-administered medications on file prior to visit.    BP 122/80  Pulse 91  Temp(Src) 98.2 F (36.8 C) (Oral)  Wt 219 lb (99.338 kg)  BMI 32.33 kg/m2  SpO2 99%       Objective:   Physical Exam  Constitutional: He is oriented to person, place, and time. He appears well-developed and well-nourished.  HENT:  Head: Normocephalic and atraumatic.  Neck: Normal range of  motion. Neck supple.  Cardiovascular: Normal rate, regular rhythm and normal heart sounds.   Pulmonary/Chest: Effort normal and breath sounds normal. He has no wheezes.  Musculoskeletal: He exhibits no edema.  Neurological: He is alert and oriented to person, place, and time. He displays normal reflexes. No cranial nerve deficit. He exhibits normal muscle tone.  Skin: Skin is warm and dry.  Psychiatric: He has a normal mood and affect. His behavior is normal.          Assessment & Plan:

## 2012-05-07 ENCOUNTER — Encounter: Payer: Self-pay | Admitting: Internal Medicine

## 2012-05-07 ENCOUNTER — Ambulatory Visit (INDEPENDENT_AMBULATORY_CARE_PROVIDER_SITE_OTHER): Payer: BC Managed Care – PPO | Admitting: Internal Medicine

## 2012-05-07 VITALS — BP 114/82 | HR 92 | Temp 98.0°F | Wt 217.0 lb

## 2012-05-07 DIAGNOSIS — M545 Low back pain, unspecified: Secondary | ICD-10-CM

## 2012-05-07 DIAGNOSIS — J069 Acute upper respiratory infection, unspecified: Secondary | ICD-10-CM

## 2012-05-07 MED ORDER — METHOCARBAMOL 500 MG PO TABS: 500.0000 mg | ORAL_TABLET | Freq: Three times a day (TID) | ORAL | Status: DC | PRN

## 2012-05-07 NOTE — Patient Instructions (Addendum)
Please complete the following lab tests before your next follow up appointment: BMET, FLP, LFTs - 401.9 

## 2012-05-07 NOTE — Progress Notes (Signed)
  Subjective:    Patient ID: Scott Carson, male    DOB: 12-09-1970, 42 y.o.   MRN: [id number]  HPI  42 year old African American male for followup regarding chronic low back pain.  Patient still experiencing intermittent symptoms. His back pain in the past has been right-sided but now they're left-sided. His back pain is always worse with any kind of activity/bending.  Patient also complains of upper respiratory infection. His symptoms started this weekend. His 33-year-old son has similar illness. He has mild cough and nasal congestion. He denies fever or chills.  Review of Systems Intermittent palpitations.  Drinks several cups of coffee per day  Past Medical History  Diagnosis Date  . Hypertension   . Smoker   . Low back pain   . Eczema of hand   . Arthritis     BACK    History   Social History  . Marital Status: Married    Spouse Name: N/A    Number of Children: N/A  . Years of Education: N/A   Occupational History  . Not on file.   Social History Main Topics  . Smoking status: Former Smoker -- 0.50 packs/day    Types: Cigarettes    Start date: 04/30/2012  . Smokeless tobacco: Never Used     Comment: Counseling sheet given to quit smoking in exam room   . Alcohol Use: No     Comment: occasionaly  . Drug Use: No  . Sexually Active: Not on file   Other Topics Concern  . Not on file   Social History Narrative   Married   Alcohol use-no      Current Smoker   Occupation: Holiday representative            Past Surgical History  Procedure Laterality Date  . Tonsillectomy      Family History  Problem Relation Age of Onset  . Hypertension Mother   . Diabetes Maternal Grandmother   . Stroke Maternal Grandmother   . Other Neg Hx     no family history of colon cancer  . Colon cancer Paternal Grandfather     No Known Allergies  Current Outpatient Prescriptions on File Prior to Visit  Medication Sig Dispense Refill  . EXFORGE HCT 10-160-12.5 MG TABS TAKE 1 TABLET  BY MOUTH DAILY.  90 tablet  1   No current facility-administered medications on file prior to visit.    BP 114/82  Pulse 92  Temp(Src) 98 F (36.7 C) (Oral)  Wt 217 lb (98.431 kg)  BMI 32.03 kg/m2       Objective:   Physical Exam  Constitutional: He is oriented to person, place, and time. He appears well-developed and well-nourished.  HENT:  Head: Normocephalic and atraumatic.  Mouth/Throat: No oropharyngeal exudate.  Oropharyngeal erythema  Neck: Neck supple.  Cardiovascular: Normal rate, regular rhythm and normal heart sounds.   No murmur heard. Pulmonary/Chest: Effort normal and breath sounds normal. He has no wheezes.  Lymphadenopathy:    He has no cervical adenopathy.  Neurological: He is alert and oriented to person, place, and time.  Psychiatric: He has a normal mood and affect. His behavior is normal.          Assessment & Plan:

## 2012-05-07 NOTE — Assessment & Plan Note (Signed)
42 year old American male has signs and symptoms of viral upper respiratory infection. I recommended symptomatic treatment. Patient advised to call office if symptoms persist or worsen.

## 2012-05-07 NOTE — Assessment & Plan Note (Signed)
Patient has chronic episodic low back pain. His symptoms exacerbated by nature of his work/lifting. Patient advised to avoid lifting greater than 15 pounds. Work note provided. Continue Robaxin as needed. If symptoms worsen, refill tramadol.

## 2012-06-01 ENCOUNTER — Telehealth: Payer: Self-pay | Admitting: Internal Medicine

## 2012-06-01 NOTE — Telephone Encounter (Signed)
Patient Information:  Caller Name: Esaw  Phone: [phone]  Patient: Scott Carson, Scott Carson  Gender: Male  DOB: 17-Jan-1971  Age: 42 Years  PCP: Artist Pais Doe-Hyun [name]) (Adults only)  Office Follow Up:  Does the office need to follow up with this patient?: Yes  Instructions For The Office: Could the HTN medicatinos the pt is on cause any irregularities on his urine drug screen for work.  He had it done 05/31/12 and he was told it came back  "inconclusive" for Cannabis.   Please call him to advise.   Symptoms  Reason For Call & Symptoms: Pt is on several medications and had to have a urine test for work and urine showed "inconclusive", but he does not know for what,  and they wanted to do further testing.  Could the prescribed drugs he is on be causing something to show up on a drug screen?  Reviewed Health History In EMR: N/A  Reviewed Medications In EMR: N/A  Reviewed Allergies In EMR: N/A  Reviewed Surgeries / Procedures: N/A  Date of Onset of Symptoms: 05/31/2012  Guideline(s) Used:  No Protocol Available - Information Only  Disposition Per Guideline:   Discuss with PCP and Callback by Nurse Today  Reason For Disposition Reached:   Nursing judgment  Advice Given:  N/A  Patient Will Follow Care Advice:  YES

## 2012-06-04 NOTE — Telephone Encounter (Signed)
I am not aware of issues with his BP meds causing false positive on UDS.  Arline Asp - call a pharmacist at [facility] and see if this possible.  thanks

## 2012-06-04 NOTE — Telephone Encounter (Signed)
No Exforge can not cause false positives according to [employer]

## 2012-06-06 NOTE — Telephone Encounter (Signed)
Play relay information to pt

## 2012-06-06 NOTE — Telephone Encounter (Signed)
Pt aware.

## 2012-08-28 ENCOUNTER — Ambulatory Visit (INDEPENDENT_AMBULATORY_CARE_PROVIDER_SITE_OTHER)
Admission: RE | Admit: 2012-08-28 | Discharge: 2012-08-28 | Disposition: A | Discharge status: Home / Self Care | Payer: BC Managed Care – PPO | Source: Ambulatory Visit | Attending: Family | Admitting: Family

## 2012-08-28 ENCOUNTER — Ambulatory Visit (INDEPENDENT_AMBULATORY_CARE_PROVIDER_SITE_OTHER): Payer: BC Managed Care – PPO | Admitting: Family

## 2012-08-28 ENCOUNTER — Ambulatory Visit
Admission: RE | Admit: 2012-08-28 | Discharge: 2012-08-28 | Disposition: A | Discharge status: Home / Self Care | Payer: BC Managed Care – PPO | Source: Ambulatory Visit | Attending: Family | Admitting: Family

## 2012-08-28 ENCOUNTER — Encounter: Payer: Self-pay | Admitting: Family

## 2012-08-28 VITALS — BP 120/90 | HR 106 | Wt 206.0 lb

## 2012-08-28 DIAGNOSIS — M19019 Primary osteoarthritis, unspecified shoulder: Secondary | ICD-10-CM

## 2012-08-28 DIAGNOSIS — M25512 Pain in left shoulder: Secondary | ICD-10-CM

## 2012-08-28 DIAGNOSIS — M25529 Pain in unspecified elbow: Secondary | ICD-10-CM

## 2012-08-28 DIAGNOSIS — M25519 Pain in unspecified shoulder: Secondary | ICD-10-CM

## 2012-08-28 DIAGNOSIS — I10 Essential (primary) hypertension: Secondary | ICD-10-CM

## 2012-08-28 DIAGNOSIS — M19012 Primary osteoarthritis, left shoulder: Secondary | ICD-10-CM

## 2012-08-28 DIAGNOSIS — M25522 Pain in left elbow: Secondary | ICD-10-CM

## 2012-08-28 MED ORDER — TRAMADOL HCL 50 MG PO TABS: 50.0000 mg | ORAL_TABLET | Freq: Three times a day (TID) | ORAL | Status: DC | PRN

## 2012-08-28 MED ORDER — DICLOFENAC SODIUM 75 MG PO TBEC: 75.0000 mg | DELAYED_RELEASE_TABLET | Freq: Two times a day (BID) | ORAL | Status: DC

## 2012-08-28 NOTE — Progress Notes (Signed)
Subjective:    Patient ID: Scott Carson, male    DOB: 1970-12-29, 42 y.o.   MRN: [id number]  HPI 42 year old male, patient of Dr. Artist Pais is in today with c/o left shoulder pain that has been ongoing x 2 years off and on. He is a Corporate investment banker. He rates the pain a 9/10, worse with movement. He has not taken any medication over-the-counter. Reports having a cortisone injection approximately one year ago that helped significantly. He has not had an x-ray of his shoulder.   Review of Systems  Constitutional: Negative.   Respiratory: Negative.   Cardiovascular: Negative.   Musculoskeletal: Positive for arthralgias.       Left shoulder pain  Skin: Negative.   Neurological: Negative.   Psychiatric/Behavioral: Negative.    Past Medical History  Diagnosis Date  . Hypertension   . Smoker   . Low back pain   . Eczema of hand   . Arthritis     BACK    History   Social History  . Marital Status: Married    Spouse Name: N/A    Number of Children: N/A  . Years of Education: N/A   Occupational History  . Not on file.   Social History Main Topics  . Smoking status: Current Every Day Smoker -- 0.50 packs/day    Types: Cigarettes    Start date: 04/30/2012  . Smokeless tobacco: Never Used     Comment: Counseling sheet given to quit smoking in exam room   . Alcohol Use: No     Comment: occasionaly  . Drug Use: No  . Sexually Active: Not on file   Other Topics Concern  . Not on file   Social History Narrative   Married   Alcohol use-no      Current Smoker   Occupation: Holiday representative            Past Surgical History  Procedure Laterality Date  . Tonsillectomy      Family History  Problem Relation Age of Onset  . Hypertension Mother   . Diabetes Maternal Grandmother   . Stroke Maternal Grandmother   . Other Neg Hx     no family history of colon cancer  . Colon cancer Paternal Grandfather     No Known Allergies  Current Outpatient Prescriptions on File Prior to  Visit  Medication Sig Dispense Refill  . EXFORGE HCT 10-160-12.5 MG TABS TAKE 1 TABLET BY MOUTH DAILY.  90 tablet  1  . methocarbamol (ROBAXIN) 500 MG tablet Take 1 tablet (500 mg total) by mouth 3 (three) times daily as needed.  90 tablet  1   No current facility-administered medications on file prior to visit.    BP 120/90  Pulse 106  Wt 206 lb (93.441 kg)  BMI 30.41 kg/m2  SpO2 99%chart    Objective:   Physical Exam  Constitutional: He is oriented to person, place, and time. He appears well-developed and well-nourished.  Neck: Normal range of motion. Neck supple. No thyromegaly present.  Cardiovascular: Normal rate, regular rhythm and normal heart sounds.   Pulmonary/Chest: Effort normal and breath sounds normal.  Musculoskeletal: He exhibits tenderness.  Pain with adduction and abduction  Neurological: He is alert and oriented to person, place, and time. He has normal reflexes.  Skin: Skin is warm and dry.  Psychiatric: He has a normal mood and affect.          Assessment & Plan:  Assessment:  1. Left Shoulder  Pain 2. Left Shoulder Osteoarthritis 3. Hypertension  Plan: X-ray of the left shoulder. Will notify patient pending results. Diclofenac 75 mg one tablet twice a day with food. Tramadol as needed for pain. Consider cortisone injection pending the results of his x-ray. Call the office with any questions or concerns. Recheck a schedule, and as needed.

## 2012-08-28 NOTE — Addendum Note (Signed)
Addended by: Beverely Low on: 08/28/2012 03:12 PM   Modules accepted: Orders

## 2012-08-28 NOTE — Patient Instructions (Addendum)

## 2012-09-28 ENCOUNTER — Other Ambulatory Visit: Payer: Self-pay | Admitting: Internal Medicine

## 2012-10-31 ENCOUNTER — Other Ambulatory Visit (INDEPENDENT_AMBULATORY_CARE_PROVIDER_SITE_OTHER): Payer: BC Managed Care – PPO

## 2012-10-31 ENCOUNTER — Other Ambulatory Visit: Payer: BC Managed Care – PPO

## 2012-10-31 DIAGNOSIS — I10 Essential (primary) hypertension: Secondary | ICD-10-CM

## 2012-10-31 LAB — BASIC METABOLIC PANEL
BUN: 11 mg/dL (ref 6–23)
Chloride: 104 mEq/L (ref 96–112)
Creatinine, Ser: 1 mg/dL (ref 0.4–1.5)
Glucose, Bld: 88 mg/dL (ref 70–99)
Potassium: 4.3 mEq/L (ref 3.5–5.1)

## 2012-10-31 LAB — LIPID PANEL
Cholesterol: 140 mg/dL (ref 0–200)
Triglycerides: 20 mg/dL (ref 0.0–149.0)
VLDL: 4 mg/dL (ref 0.0–40.0)

## 2012-10-31 LAB — HEPATIC FUNCTION PANEL
ALT: 19 U/L (ref 0–53)
AST: 27 U/L (ref 0–37)
Albumin: 4.3 g/dL (ref 3.5–5.2)

## 2012-11-07 ENCOUNTER — Ambulatory Visit: Payer: BC Managed Care – PPO | Admitting: Internal Medicine

## 2012-11-08 ENCOUNTER — Ambulatory Visit (INDEPENDENT_AMBULATORY_CARE_PROVIDER_SITE_OTHER): Payer: BC Managed Care – PPO | Admitting: Internal Medicine

## 2012-11-08 ENCOUNTER — Encounter: Payer: Self-pay | Admitting: Internal Medicine

## 2012-11-08 VITALS — BP 120/80 | Temp 98.2°F | Wt 212.0 lb

## 2012-11-08 DIAGNOSIS — I10 Essential (primary) hypertension: Secondary | ICD-10-CM

## 2012-11-08 DIAGNOSIS — F17200 Nicotine dependence, unspecified, uncomplicated: Secondary | ICD-10-CM

## 2012-11-08 DIAGNOSIS — Z23 Encounter for immunization: Secondary | ICD-10-CM

## 2012-11-08 MED ORDER — VARENICLINE TARTRATE 0.5 MG X 11 & 1 MG X 42 PO MISC: ORAL | Status: DC

## 2012-11-08 MED ORDER — VARENICLINE TARTRATE 1 MG PO TABS: 1.0000 mg | ORAL_TABLET | Freq: Two times a day (BID) | ORAL | Status: DC

## 2012-11-08 NOTE — Progress Notes (Signed)
Subjective:    Patient ID: KORY RAINS, male    DOB: 1971-02-13, 42 y.o.   MRN: [id number]  HPI  42 year old African American male for followup regarding chronic hypertension and hyperlipidemia. Patient reports good medication compliance. He's not having any side effects. We reviewed his blood work. His kidney function and electrolytes are stable. His lipid panel is well-controlled.  Unfortunately patient went back to smoking. He is smoking one pack per day. He would very much like to quit smoking if possible.  Review of Systems Chronic intermittent back pain, negative for chest pain    Past Medical History  Diagnosis Date  . Hypertension   . Smoker   . Low back pain   . Eczema of hand   . Arthritis     BACK    History   Social History  . Marital Status: Married    Spouse Name: N/A    Number of Children: N/A  . Years of Education: N/A   Occupational History  . Not on file.   Social History Main Topics  . Smoking status: Current Every Day Smoker -- 0.50 packs/day    Types: Cigarettes    Start date: 04/30/2012  . Smokeless tobacco: Never Used     Comment: Counseling sheet given to quit smoking in exam room   . Alcohol Use: No     Comment: occasionaly  . Drug Use: No  . Sexual Activity: Not on file   Other Topics Concern  . Not on file   Social History Narrative   Married   Alcohol use-no      Current Smoker   Occupation: Holiday representative            Past Surgical History  Procedure Laterality Date  . Tonsillectomy      Family History  Problem Relation Age of Onset  . Hypertension Mother   . Diabetes Maternal Grandmother   . Stroke Maternal Grandmother   . Other Neg Hx     no family history of colon cancer  . Colon cancer Paternal Grandfather     No Known Allergies  Current Outpatient Prescriptions on File Prior to Visit  Medication Sig Dispense Refill  . diclofenac (VOLTAREN) 75 MG EC tablet Take 1 tablet (75 mg total) by mouth 2 (two) times  daily.  60 tablet  2  . EXFORGE HCT 10-160-12.5 MG TABS TAKE 1 TABLET BY MOUTH DAILY.  90 tablet  1  . methocarbamol (ROBAXIN) 500 MG tablet Take 1 tablet (500 mg total) by mouth 3 (three) times daily as needed.  90 tablet  1  . traMADol (ULTRAM) 50 MG tablet Take 1 tablet (50 mg total) by mouth every 8 (eight) hours as needed for pain.  30 tablet  0   No current facility-administered medications on file prior to visit.    BP 120/80  Temp(Src) 98.2 F (36.8 C) (Oral)  Wt 212 lb (96.163 kg)  BMI 31.29 kg/m2      Objective:   Physical Exam  Constitutional: He is oriented to person, place, and time. He appears well-developed and well-nourished. No distress.  Cardiovascular: Normal rate, regular rhythm and normal heart sounds.   No murmur heard. Pulmonary/Chest: Effort normal and breath sounds normal. He has no wheezes.  Neurological: He is alert and oriented to person, place, and time. No cranial nerve deficit.  Psychiatric: He has a normal mood and affect. His behavior is normal.          Assessment & Plan:

## 2012-11-08 NOTE — Assessment & Plan Note (Addendum)
BP is well controlled.  120/80 with manual cuff.  Patients ASCVD is 8.4%   If pt unable to quit smoking, discuss starting statin therapy at next OV.

## 2012-11-08 NOTE — Assessment & Plan Note (Signed)
Retry Chantix.  He tolerated in the past with exception of vivid dreams.

## 2012-11-15 ENCOUNTER — Other Ambulatory Visit: Payer: Self-pay | Admitting: Internal Medicine

## 2012-12-05 ENCOUNTER — Other Ambulatory Visit: Payer: Self-pay | Admitting: Internal Medicine

## 2013-02-07 ENCOUNTER — Ambulatory Visit: Payer: BC Managed Care – PPO | Admitting: Internal Medicine

## 2013-02-08 ENCOUNTER — Ambulatory Visit: Payer: BC Managed Care – PPO | Admitting: Internal Medicine

## 2013-02-22 ENCOUNTER — Encounter: Payer: Self-pay | Admitting: Internal Medicine

## 2013-02-22 ENCOUNTER — Ambulatory Visit (INDEPENDENT_AMBULATORY_CARE_PROVIDER_SITE_OTHER): Payer: BC Managed Care – PPO | Admitting: Internal Medicine

## 2013-02-22 VITALS — BP 126/84 | HR 69 | Temp 97.7°F | Wt 220.0 lb

## 2013-02-22 DIAGNOSIS — I10 Essential (primary) hypertension: Secondary | ICD-10-CM

## 2013-02-22 DIAGNOSIS — F17200 Nicotine dependence, unspecified, uncomplicated: Secondary | ICD-10-CM

## 2013-02-22 MED ORDER — VARENICLINE TARTRATE 1 MG PO TABS: 1.0000 mg | ORAL_TABLET | Freq: Two times a day (BID) | ORAL | Status: DC

## 2013-02-22 MED ORDER — PRAVASTATIN SODIUM 20 MG PO TABS: 20.0000 mg | ORAL_TABLET | Freq: Every day | ORAL | Status: DC

## 2013-02-22 MED ORDER — AMLODIPINE-VALSARTAN-HCTZ 10-160-12.5 MG PO TABS: 1.0000 | ORAL_TABLET | Freq: Every day | ORAL | Status: DC

## 2013-02-22 NOTE — Patient Instructions (Signed)
Please complete the following lab tests before your next follow up appointment: BMET - 401.9 FLP, LFTs - 272.4

## 2013-02-22 NOTE — Progress Notes (Signed)
   Subjective:    Patient ID: Scott Carson, male    DOB: 10/25/1970, 43 y.o.   MRN: [id number]  HPI  43 year old African American male with history of hypertension and tobacco use for routine followup.  Patient was prescribed Chantix at previous visit. Patient has tried to taper off tobacco/cigarettes but has been unsuccessful.  Hypertension-good medication compliance. At previous visit his ten-year cardiovascular risk was elevated. Patient has never used a statin medication in the past.   Review of Systems Negative for chest pain or shortness of breath    Past Medical History  Diagnosis Date  . Hypertension   . Smoker   . Low back pain   . Eczema of hand   . Arthritis     BACK    History   Social History  . Marital Status: Married    Spouse Name: N/A    Number of Children: N/A  . Years of Education: N/A   Occupational History  . Not on file.   Social History Main Topics  . Smoking status: Current Every Day Smoker -- 0.50 packs/day    Types: Cigarettes    Start date: 04/30/2012  . Smokeless tobacco: Never Used     Comment: Counseling sheet given to quit smoking in exam room   . Alcohol Use: No     Comment: occasionaly  . Drug Use: No  . Sexual Activity: Not on file   Other Topics Concern  . Not on file   Social History Narrative   Married   Alcohol use-no      Current Smoker   Occupation: Architect            Past Surgical History  Procedure Laterality Date  . Tonsillectomy      Family History  Problem Relation Age of Onset  . Hypertension Mother   . Diabetes Maternal Grandmother   . Stroke Maternal Grandmother   . Other Neg Hx     no family history of colon cancer  . Colon cancer Paternal Grandfather     No Known Allergies  Current Outpatient Prescriptions on File Prior to Visit  Medication Sig Dispense Refill  . diclofenac (VOLTAREN) 75 MG EC tablet Take 1 tablet (75 mg total) by mouth 2 (two) times daily.  60 tablet  2  .  methocarbamol (ROBAXIN) 500 MG tablet Take 1 tablet (500 mg total) by mouth 3 (three) times daily as needed.  90 tablet  1  . traMADol (ULTRAM) 50 MG tablet Take 1 tablet (50 mg total) by mouth every 8 (eight) hours as needed for pain.  30 tablet  0   No current facility-administered medications on file prior to visit.    BP 126/84  Pulse 69  Temp(Src) 97.7 F (36.5 C) (Oral)  Wt 220 lb (99.791 kg)  SpO2 99%    Objective:   Physical Exam  Constitutional: He appears well-developed and well-nourished. No distress.  Neck:  No carotid bruit  Cardiovascular: Normal rate and regular rhythm.   No murmur heard. Pulmonary/Chest: Effort normal and breath sounds normal. He has no wheezes.  Musculoskeletal: He exhibits no edema.  Neurological: He is alert. No cranial nerve deficit.  Psychiatric: He has a normal mood and affect. His behavior is normal.          Assessment & Plan:

## 2013-02-22 NOTE — Progress Notes (Signed)
Pre visit review using our clinic review tool, if applicable. No additional management support is needed unless otherwise documented below in the visit note. 

## 2013-02-22 NOTE — Assessment & Plan Note (Signed)
Blood pressure is well-controlled. No change to blood pressure medication regimen. Add pravastatin 20 mg once daily for primary prevention. BP: 126/84 mmHg

## 2013-02-22 NOTE — Assessment & Plan Note (Signed)
Patient urged to continue with trial of Chantix. Patient to set quit date within 1 to 2 weeks of starting medication.

## 2013-03-27 ENCOUNTER — Telehealth: Payer: Self-pay | Admitting: Internal Medicine

## 2013-03-27 NOTE — Telephone Encounter (Signed)
Pt is needing new rx for varenicline (CHANTIX) 1 MG tablet, and amlodipine-valsartan-hctz exforge hct sent to [facility] on [address],   pt also states that dr. Shawna Orleans had written him a rx for his cholesterol but he forgot to get it from the phar and it was returned, pt not sure what the name of the meds is because he did not pick it up, but would like to see if it can be resent.

## 2013-03-28 MED ORDER — PRAVASTATIN SODIUM 20 MG PO TABS: 20.0000 mg | ORAL_TABLET | Freq: Every day | ORAL | Status: DC

## 2013-03-28 MED ORDER — VARENICLINE TARTRATE 1 MG PO TABS: 1.0000 mg | ORAL_TABLET | Freq: Two times a day (BID) | ORAL | Status: DC

## 2013-03-28 MED ORDER — AMLODIPINE-VALSARTAN-HCTZ 10-160-12.5 MG PO TABS: 1.0000 | ORAL_TABLET | Freq: Every day | ORAL | Status: DC

## 2013-03-28 NOTE — Telephone Encounter (Signed)
rx's sent in electronically 

## 2013-04-19 ENCOUNTER — Telehealth: Payer: Self-pay | Admitting: Internal Medicine

## 2013-04-19 NOTE — Telephone Encounter (Signed)
Pt states Amlodipine-Valsartan-HCTZ (EXFORGE HCT) 10-160-12.5 MG TABS makes him feel dizzy and would like to take Exforge. Please advise.  [place]

## 2013-04-23 NOTE — Telephone Encounter (Addendum)
Pt states he takes the Exforge at night now and he does not have the dizziness.  Is that ok or do you want to change dosage?

## 2013-04-23 NOTE — Telephone Encounter (Signed)
i would change exforge hct dose to 160/5/12.5 mg  See if we have some samples of that dose.  If not, call new dose to [id number] with 2 rf.  I suggest ov within 4 weeks of changing medication.

## 2013-05-01 NOTE — Telephone Encounter (Signed)
I suggest we change to valsartan 160 mg and amlodpine 5 mg.  Call in 1 month supply.  OV in 2 weeks.

## 2013-05-02 MED ORDER — VALSARTAN 160 MG PO TABS: 160.0000 mg | ORAL_TABLET | Freq: Every day | ORAL | Status: DC

## 2013-05-02 MED ORDER — AMLODIPINE BESYLATE 5 MG PO TABS: 5.0000 mg | ORAL_TABLET | Freq: Every day | ORAL | Status: DC

## 2013-05-02 NOTE — Telephone Encounter (Signed)
Pt aware, rx sent in electronically 

## 2013-05-09 ENCOUNTER — Telehealth: Payer: Self-pay | Admitting: Internal Medicine

## 2013-05-09 NOTE — Telephone Encounter (Signed)
Pt already has appt   with dr Shawna Orleans on 05-17-13. Pt decline sooner appt

## 2013-05-09 NOTE — Telephone Encounter (Signed)
Caller: Scott Carson/Patient; Phone: [phone]; Reason for Call: The blood pressuer medicine you prescribed patient is still making him dizzy

## 2013-05-09 NOTE — Telephone Encounter (Signed)
I suggest pt come in for office visit.

## 2013-05-10 NOTE — Telephone Encounter (Incomplete)
Can pt monitor BP at home daily and call back next week with readings or bring readings to next OV.

## 2013-05-10 NOTE — Telephone Encounter (Signed)
See last note

## 2013-05-15 NOTE — Telephone Encounter (Signed)
Left message on cell phone.  With Dr Lora Havens instructions

## 2013-05-17 ENCOUNTER — Encounter: Payer: Self-pay | Admitting: Internal Medicine

## 2013-05-17 ENCOUNTER — Ambulatory Visit (INDEPENDENT_AMBULATORY_CARE_PROVIDER_SITE_OTHER): Payer: BC Managed Care – PPO | Admitting: Internal Medicine

## 2013-05-17 VITALS — BP 134/98 | HR 88 | Temp 97.8°F | Ht 69.0 in | Wt 224.0 lb

## 2013-05-17 DIAGNOSIS — H6120 Impacted cerumen, unspecified ear: Secondary | ICD-10-CM | POA: Insufficient documentation

## 2013-05-17 DIAGNOSIS — I10 Essential (primary) hypertension: Secondary | ICD-10-CM

## 2013-05-17 DIAGNOSIS — R350 Frequency of micturition: Secondary | ICD-10-CM | POA: Insufficient documentation

## 2013-05-17 DIAGNOSIS — E785 Hyperlipidemia, unspecified: Secondary | ICD-10-CM | POA: Insufficient documentation

## 2013-05-17 DIAGNOSIS — R351 Nocturia: Secondary | ICD-10-CM

## 2013-05-17 LAB — HEPATIC FUNCTION PANEL
ALK PHOS: 61 U/L (ref 39–117)
ALT: 22 U/L (ref 0–53)
AST: 25 U/L (ref 0–37)
Albumin: 4.4 g/dL (ref 3.5–5.2)
BILIRUBIN DIRECT: 0 mg/dL (ref 0.0–0.3)
BILIRUBIN TOTAL: 0.7 mg/dL (ref 0.3–1.2)
Total Protein: 7.4 g/dL (ref 6.0–8.3)

## 2013-05-17 LAB — POCT URINALYSIS DIPSTICK
BILIRUBIN UA: NEGATIVE
GLUCOSE UA: NEGATIVE
Ketones, UA: NEGATIVE
Leukocytes, UA: NEGATIVE
Nitrite, UA: NEGATIVE
RBC UA: NEGATIVE
SPEC GRAV UA: 1.025
UROBILINOGEN UA: 0.2
pH, UA: 7

## 2013-05-17 LAB — LIPID PANEL
CHOL/HDL RATIO: 3
Cholesterol: 133 mg/dL (ref 0–200)
HDL: 48.8 mg/dL (ref >39.00)
LDL CALC: 68 mg/dL (ref 0–99)
Triglycerides: 80 mg/dL (ref 0.0–149.0)
VLDL: 16 mg/dL (ref 0.0–40.0)

## 2013-05-17 LAB — BASIC METABOLIC PANEL
BUN: 11 mg/dL (ref 6–23)
CHLORIDE: 104 meq/L (ref 96–112)
CO2: 27 meq/L (ref 19–32)
CREATININE: 1.1 mg/dL (ref 0.4–1.5)
Calcium: 9.6 mg/dL (ref 8.4–10.5)
GFR: 93.5 mL/min (ref >60.00)
GLUCOSE: 64 mg/dL — AB (ref 70–99)
POTASSIUM: 4.2 meq/L (ref 3.5–5.1)
Sodium: 138 mEq/L (ref 135–145)

## 2013-05-17 MED ORDER — VALSARTAN 160 MG PO TABS: 160.0000 mg | ORAL_TABLET | Freq: Every day | ORAL | Status: DC

## 2013-05-17 MED ORDER — NEBIVOLOL HCL 10 MG PO TABS: 10.0000 mg | ORAL_TABLET | Freq: Every day | ORAL | Status: DC

## 2013-05-17 NOTE — Assessment & Plan Note (Signed)
Patient tolerating Pravastatin.  Monitor LFTs.

## 2013-05-17 NOTE — Progress Notes (Signed)
Pre visit review using our clinic review tool, if applicable. No additional management support is needed unless otherwise documented below in the visit note. 

## 2013-05-17 NOTE — Assessment & Plan Note (Addendum)
UA negative for signs of infection.  He does not have any symptoms of BPH.  He has 3 episodes of nocturia. Patient advised to decrease caffeine use and drink less fluids after 7 PM.

## 2013-05-17 NOTE — Assessment & Plan Note (Signed)
Patient experiencing intermittent dizziness. He is not orthostatic on exam. I doubt his symptoms related to benign positional vertigo. Patient has bilateral cerumen impaction. It is unclear whether this may have contributed to his symptoms.  Patient's intermittent headaches may be secondary to amlodipine. Discontinue amlodipine. Switch to Bystolic 10 mg once daily. Continue same dose of valsartan 160 mg.  BP: 134/98 mmHg

## 2013-05-17 NOTE — Assessment & Plan Note (Signed)
Patient has bilateral cerumen impaction. Irrigated both ear canals and utilized curette to remove cerumen plugs. Patient tolerated well. Patient experienced immediate improvement in gross hearing.

## 2013-05-17 NOTE — Progress Notes (Signed)
Subjective:    Patient ID: Scott Carson, male    DOB: 10-13-1970, 43 y.o.   MRN: [id number]  HPI 43 year old African American male with history of hypertension and tobacco use for followup. Patient has been experiencing intermittent dizziness. His blood pressure medication changed to Diovan 160 mg and amlodipine 5 mg. His HCTZ was discontinued.  Despite medication change patient still experiencing intermittent dizziness. The symptoms mainly occur after prolonged sitting in front of a computer.  Patient also complains of increased urinary frequency. He wakes up 3 times a night to urinate. He complains of intermittent tension like headaches.  Patient able to quit smoking on Chantix.    Review of Systems  Nocturia and urinary frequency, intermittent tension headache.  No fullness in ears or hearing loss    Past Medical History  Diagnosis Date  . Hypertension   . Smoker   . Low back pain   . Eczema of hand   . Arthritis     BACK    History   Social History  . Marital Status: Married    Spouse Name: N/A    Number of Children: N/A  . Years of Education: N/A   Occupational History  . Not on file.   Social History Main Topics  . Smoking status: Current Every Day Smoker -- 0.50 packs/day    Types: Cigarettes    Start date: 04/30/2012  . Smokeless tobacco: Never Used     Comment: Counseling sheet given to quit smoking in exam room   . Alcohol Use: No     Comment: occasionaly  . Drug Use: No  . Sexual Activity: Not on file   Other Topics Concern  . Not on file   Social History Narrative   Married   Alcohol use-no      Current Smoker   Occupation: Architect            Past Surgical History  Procedure Laterality Date  . Tonsillectomy      Family History  Problem Relation Age of Onset  . Hypertension Mother   . Diabetes Maternal Grandmother   . Stroke Maternal Grandmother   . Other Neg Hx     no family history of colon cancer  . Colon cancer Paternal  Grandfather     No Known Allergies  Current Outpatient Prescriptions on File Prior to Visit  Medication Sig Dispense Refill  . amLODipine (NORVASC) 5 MG tablet Take 1 tablet (5 mg total) by mouth daily.  30 tablet  0  . diclofenac (VOLTAREN) 75 MG EC tablet Take 1 tablet (75 mg total) by mouth 2 (two) times daily.  60 tablet  2  . methocarbamol (ROBAXIN) 500 MG tablet Take 1 tablet (500 mg total) by mouth 3 (three) times daily as needed.  90 tablet  1  . pravastatin (PRAVACHOL) 20 MG tablet Take 1 tablet (20 mg total) by mouth daily.  90 tablet  1  . traMADol (ULTRAM) 50 MG tablet Take 1 tablet (50 mg total) by mouth every 8 (eight) hours as needed for pain.  30 tablet  0  . valsartan (DIOVAN) 160 MG tablet Take 1 tablet (160 mg total) by mouth daily.  30 tablet  0  . varenicline (CHANTIX) 1 MG tablet Take 1 tablet (1 mg total) by mouth 2 (two) times daily.  60 tablet  3   No current facility-administered medications on file prior to visit.    BP 134/98  Pulse 88  Temp(Src) 97.8  F (36.6 C) (Oral)  Ht 5\' 9"  (1.753 m)  Wt 224 lb (101.606 kg)  BMI 33.06 kg/m2    Objective:   Physical Exam  Constitutional: He is oriented to person, place, and time. He appears well-developed and well-nourished. No distress.  HENT:  Head: Normocephalic and atraumatic.  Bilateral cerumen impaction  Eyes: EOM are normal. Pupils are equal, round, and reactive to light.  Neck: Neck supple.  Cardiovascular: Normal rate, regular rhythm and normal heart sounds.   Pulmonary/Chest: Effort normal and breath sounds normal. He has no wheezes.  Lymphadenopathy:    He has no cervical adenopathy.  Neurological: He is alert and oriented to person, place, and time. No cranial nerve deficit.  Psychiatric: He has a normal mood and affect. His behavior is normal.       Assessment & Plan:

## 2013-05-18 ENCOUNTER — Telehealth: Payer: Self-pay | Admitting: Internal Medicine

## 2013-05-18 NOTE — Telephone Encounter (Signed)
Relevant patient education mailed to patient.  

## 2013-05-30 ENCOUNTER — Telehealth: Payer: Self-pay | Admitting: Internal Medicine

## 2013-05-30 MED ORDER — VALSARTAN 160 MG PO TABS: 160.0000 mg | ORAL_TABLET | Freq: Every day | ORAL | Status: DC

## 2013-05-30 MED ORDER — NEBIVOLOL HCL 10 MG PO TABS: 10.0000 mg | ORAL_TABLET | Freq: Every day | ORAL | Status: DC

## 2013-05-30 NOTE — Telephone Encounter (Signed)
90 day supply sent in electronically 

## 2013-05-30 NOTE — Telephone Encounter (Signed)
Pt needs new rxs for diovan 160 mg #35 and bystolic 10 mg #46 with refills sent to [address]

## 2013-05-30 NOTE — Addendum Note (Signed)
Addended by: Townsend Roger D on: 05/30/2013 03:52 PM   Modules accepted: Orders

## 2013-06-21 ENCOUNTER — Other Ambulatory Visit: Payer: BC Managed Care – PPO

## 2013-06-28 ENCOUNTER — Encounter: Payer: Self-pay | Admitting: Internal Medicine

## 2013-06-28 ENCOUNTER — Ambulatory Visit (INDEPENDENT_AMBULATORY_CARE_PROVIDER_SITE_OTHER): Payer: BC Managed Care – PPO | Admitting: Internal Medicine

## 2013-06-28 VITALS — BP 154/98 | HR 76 | Temp 97.9°F | Ht 69.0 in | Wt 218.0 lb

## 2013-06-28 DIAGNOSIS — I10 Essential (primary) hypertension: Secondary | ICD-10-CM

## 2013-06-28 MED ORDER — LABETALOL HCL 200 MG PO TABS: 200.0000 mg | ORAL_TABLET | Freq: Two times a day (BID) | ORAL | Status: DC

## 2013-06-28 NOTE — Progress Notes (Signed)
   Subjective:    Patient ID: Scott Carson, male    DOB: 14-Feb-1971, 43 y.o.   MRN: [id number]  HPI  43 year old African American male for followup regarding hypertension. At previous visit patient was having difficulty with dizziness when taking amlodipine. Amlodipine was discontinued. This was replaced with Bystolic 10 mg. Patient reports tolerating medication well. Unfortunately it is cost prohibitive.  History of tobacco use-patient has been able to stay off cigarettes.  Review of Systems Negative for chest pain    Past Medical History  Diagnosis Date  . Hypertension   . Smoker   . Low back pain   . Eczema of hand   . Arthritis     BACK    History   Social History  . Marital Status: Married    Spouse Name: N/A    Number of Children: N/A  . Years of Education: N/A   Occupational History  . Not on file.   Social History Main Topics  . Smoking status: Current Every Day Smoker -- 0.50 packs/day    Types: Cigarettes    Start date: 04/30/2012  . Smokeless tobacco: Never Used     Comment: Counseling sheet given to quit smoking in exam room   . Alcohol Use: No     Comment: occasionaly  . Drug Use: No  . Sexual Activity: Not on file   Other Topics Concern  . Not on file   Social History Narrative   Married   Alcohol use-no      Current Smoker   Occupation: Architect            Past Surgical History  Procedure Laterality Date  . Tonsillectomy      Family History  Problem Relation Age of Onset  . Hypertension Mother   . Diabetes Maternal Grandmother   . Stroke Maternal Grandmother   . Other Neg Hx     no family history of colon cancer  . Colon cancer Paternal Grandfather     No Known Allergies  Current Outpatient Prescriptions on File Prior to Visit  Medication Sig Dispense Refill  . valsartan (DIOVAN) 160 MG tablet Take 1 tablet (160 mg total) by mouth daily.  90 tablet  1   No current facility-administered medications on file prior to visit.      BP 154/98  Pulse 76  Temp(Src) 97.9 F (36.6 C) (Oral)  Ht 5\' 9"  (1.753 m)  Wt 218 lb (98.884 kg)  BMI 32.18 kg/m2    Objective:   Physical Exam  Constitutional: He is oriented to person, place, and time. He appears well-developed and well-nourished.  Cardiovascular: Normal rate, regular rhythm and normal heart sounds.   No murmur heard. Pulmonary/Chest: Effort normal and breath sounds normal.  Musculoskeletal: He exhibits no edema.  Neurological: He is alert and oriented to person, place, and time. No cranial nerve deficit.  Psychiatric: He has a normal mood and affect. His behavior is normal.          Assessment & Plan:

## 2013-06-28 NOTE — Progress Notes (Signed)
Pre visit review using our clinic review tool, if applicable. No additional management support is needed unless otherwise documented below in the visit note. 

## 2013-06-28 NOTE — Assessment & Plan Note (Signed)
BP still suboptimally controlled.  Bystolic is cost prohibitive.  Switch labetalol 200 mg twice daily. Reassess in 2 months. BP: 154/98 mmHg

## 2013-06-28 NOTE — Patient Instructions (Addendum)
Monitor your blood pressure at home.  Bring your blood pressure and pulse log to your next follow up appointment.

## 2013-06-29 ENCOUNTER — Telehealth: Payer: Self-pay | Admitting: Internal Medicine

## 2013-06-29 NOTE — Telephone Encounter (Signed)
Relevant patient education mailed to patient.  

## 2013-08-01 ENCOUNTER — Telehealth: Payer: Self-pay | Admitting: Gastroenterology

## 2013-08-01 NOTE — Telephone Encounter (Signed)
The pt has been scheduled for previsit and EGD he was advised to chew his food and eat slowly

## 2013-08-01 NOTE — Telephone Encounter (Signed)
Yes, direct EGD at [facility] with MAC.  Likely repeat balloon dilation. Tell him to chew well, eat slowly and take small bites in the meantime

## 2013-08-01 NOTE — Telephone Encounter (Signed)
The pt is having dysphagia to solids and liquids and is choking frequently, can we schedule a direct EGD?

## 2013-08-07 ENCOUNTER — Ambulatory Visit (AMBULATORY_SURGERY_CENTER): Payer: Self-pay | Admitting: *Deleted

## 2013-08-07 VITALS — Ht 69.0 in | Wt 221.0 lb

## 2013-08-07 DIAGNOSIS — R1310 Dysphagia, unspecified: Secondary | ICD-10-CM

## 2013-08-07 NOTE — Progress Notes (Signed)
Patient denies any allergies to eggs or soy. Patient denies any problems with anesthesia/sedation. Patient denies any oxygen use at home and does not take any diet/weight loss medications. EMMI education assisgned to patient on EGD, this was explained and instructions given to patient. 

## 2013-08-14 ENCOUNTER — Telehealth: Payer: Self-pay | Admitting: Family Medicine

## 2013-08-14 ENCOUNTER — Ambulatory Visit (AMBULATORY_SURGERY_CENTER): Payer: BC Managed Care – PPO | Admitting: Gastroenterology

## 2013-08-14 ENCOUNTER — Encounter: Payer: Self-pay | Admitting: Gastroenterology

## 2013-08-14 VITALS — BP 152/98 | HR 70 | Temp 97.7°F | Resp 16 | Ht 69.0 in | Wt 221.0 lb

## 2013-08-14 DIAGNOSIS — R1310 Dysphagia, unspecified: Secondary | ICD-10-CM

## 2013-08-14 DIAGNOSIS — K222 Esophageal obstruction: Secondary | ICD-10-CM

## 2013-08-14 MED ORDER — OMEPRAZOLE 20 MG PO CPDR: 20.0000 mg | DELAYED_RELEASE_CAPSULE | Freq: Two times a day (BID) | ORAL | Status: DC

## 2013-08-14 MED ORDER — SODIUM CHLORIDE 0.9 % IV SOLN: 500.0000 mL | INTRAVENOUS | Status: DC

## 2013-08-14 NOTE — Op Note (Signed)
[facility]  [address] [place], [id number]   ENDOSCOPY PROCEDURE REPORT  PATIENT: Scott Carson, Scott Carson  MR#: [id number] BIRTHDATE: 11-21-1970 , 43  yrs. old GENDER: Male ENDOSCOPIST: Milus Banister, MD PROCEDURE DATE:  08/14/2013 PROCEDURE:  EGD, with balloon dilation ASA CLASS:     Class II INDICATIONS:  Schatzki's ring dilated 2012 and 2013; repeat dysphagia for past 6-8 months, also pyrosis but he is not on antiacid meds. MEDICATIONS: MAC sedation, administered by CRNA and propofol (Diprivan) 250mg  IV TOPICAL ANESTHETIC: none  DESCRIPTION OF PROCEDURE: After the risks benefits and alternatives of the procedure were thoroughly explained, informed consent was obtained.  The LB [id number] endoscope was introduced through the mouth and advanced to the second portion of the duodenum. Without limitations.  The instrument was slowly withdrawn as the mucosa was fully examined.   There was erosive reflux related esophagitis at the GE junction with a focal peptic stricture vs.  inflamed Schatzki's ring.  This was dilated to 12mm using a CRE TTS balloon.  There was usual minor mucosal tear and self limited minor oozing of blood following dilation.  There was a 1-2cm hiatal hernia and mild, non-specific gastritis.  The examination was otherwise normal.  Retroflexed views revealed no abnormalities.     The scope was then withdrawn from the patient and the procedure completed.  COMPLICATIONS: There were no complications.  ENDOSCOPIC IMPRESSION: There was erosive reflux related esophagitis at the GE junction with a focal peptic stricture vs.  inflamed Schatzki's ring.  This was dilated to 37mm using a CRE TTS balloon.  There was usual minor mucosal tear and self limited minor oozing of blood following dilation.  There was a 1-2cm hiatal hernia and mild, non-specific gastritis.  The examination was otherwise normal.  RECOMMENDATIONS: Please start twice daily  OTC omeprazole (20mg  pill taken 20-30 min before BF and dinner meals) for the next 4 weeks, then OK to decrease to once daily omeprazole only.  Please call to report on your symptoms in 6 weeks.   eSigned:  Milus Banister, MD 08/14/2013 9:02 AM

## 2013-08-14 NOTE — Progress Notes (Signed)
Called to room to assist during endoscopic procedure.  Patient ID and intended procedure confirmed with present staff. Received instructions for my participation in the procedure from the performing physician.  

## 2013-08-14 NOTE — Patient Instructions (Addendum)
START OMEPRAZOLE OTC 20 MG TWICE DAILY FOR THE NEXT 4 WEEKS.JULY 23RD, THEN DECREASE TO ONCE DAILY. PLEASE CALL AND REPORT SYMPTOMS TO DR. Ardis Hughs IN 6 WEEKS TO FOLLOW UP ON YOUR SYMPTOMS.   YOU HAD AN ENDOSCOPIC PROCEDURE TODAY AT [facility]: Refer to the procedure report that was given to you for any specific questions about what was found during the examination.  If the procedure report does not answer your questions, please call your gastroenterologist to clarify.  If you requested that your care partner not be given the details of your procedure findings, then the procedure report has been included in a sealed envelope for you to review at your convenience later.  YOU SHOULD EXPECT: Some feelings of bloating in the abdomen. Passage of more gas than usual.  Walking can help get rid of the air that was put into your GI tract during the procedure and reduce the bloating. If you had a lower endoscopy (such as a colonoscopy or flexible sigmoidoscopy) you may notice spotting of blood in your stool or on the toilet paper. If you underwent a bowel prep for your procedure, then you may not have a normal bowel movement for a few days.  DIET:  NOTHING TO EAT OR DRINK UNTIL 10:00 TODAY. 10:00 UNTIL 11:00 ONLY CLEAR  FOODS. 11:00 UNTIL TOMORROW MORNING ONLY SOFT FOODS.  RESUME YOUR REGULAR DIET IN THE AM.  ACTIVITY: Your care partner should take you home directly after the procedure.  You should plan to take it easy, moving slowly for the rest of the day.  You can resume normal activity the day after the procedure however you should NOT DRIVE or use heavy machinery for 24 hours (because of the sedation medicines used during the test).    SYMPTOMS TO REPORT IMMEDIATELY: A gastroenterologist can be reached at any hour.  During normal business hours, 8:30 AM to 5:00 PM Monday through Friday, call [phone].  After hours and on weekends, please call the GI answering service at [phone] who will take a message and have the physician on call contact you.   Following upper endoscopy (EGD)  Vomiting of blood or coffee ground material  New chest pain or pain under the shoulder blades  Painful or persistently difficult swallowing  New shortness of breath  Fever of 100F or higher  Black, tarry-looking stools  FOLLOW UP: If any biopsies were taken you will be contacted by phone or by letter within the next 1-3 weeks.  Call your gastroenterologist if you have not heard about the biopsies in 3 weeks.  Our staff will call the home number listed on your records the next business day following your procedure to check on you and address any questions or concerns that you may have at that time regarding the information given to you following your procedure. This is a courtesy call and so if there is no answer at the home number and we have not heard from you through the emergency physician on call, we will assume that you have returned to your regular daily activities without incident.  SIGNATURES/CONFIDENTIALITY: You and/or your care partner have signed paperwork which will be entered into your electronic medical record.  These signatures attest to the fact that that the information above on your After Visit Summary has been reviewed and is understood.  Full responsibility of the confidentiality of this discharge information lies with you and/or your care-partner.

## 2013-08-14 NOTE — Telephone Encounter (Signed)
Have him get automated BP cuff and check home readings for next week.  Call with readings and we can make recommendation.  He needs to seen earlier if elevated BP are symptomatic - HA or chest pain

## 2013-08-14 NOTE — Progress Notes (Signed)
ADVISED PATIENT AND WIFE TO CHECK BP AT LEAST 4 X PER WEEK AND DOCUMENT. TAKE THESE RESULTS TO PCP APPOINTMENT ON July 10,2015. IF BP REMAINS ELEVATED CALL RESULTS IN .

## 2013-08-14 NOTE — Telephone Encounter (Signed)
Pt saw Dr. Edison Nasuti today and blood pressure was in right arm 150/110 and left arm 173/117 upon arrival for procedure.. Last reading was 172/88 and pt was advised by GI to get a monitor to check at home and call primary doctor. Pt had a EGD done this morning, he took his blood pressure medication, however he threw up afterwards. He stated that he was nervous because of the upcoming procedure. He does not have any symptoms at this time. Please advise?

## 2013-08-14 NOTE — Progress Notes (Signed)
Scott Nulty,CRNA notified of blood pressure. States "that's ok, he is being treated for htn and meds can be giving during case".

## 2013-08-14 NOTE — Progress Notes (Signed)
A/ox3 pleased with MAC, report to Karen RN 

## 2013-08-14 NOTE — Telephone Encounter (Signed)
I spoke with pt and went over the below information. He will check back in with [place] regarding the readings and follow up as needed.

## 2013-08-15 ENCOUNTER — Telehealth: Payer: Self-pay | Admitting: *Deleted

## 2013-08-15 NOTE — Telephone Encounter (Signed)
Left message that we called for f/u 

## 2013-08-29 ENCOUNTER — Ambulatory Visit (INDEPENDENT_AMBULATORY_CARE_PROVIDER_SITE_OTHER): Payer: BC Managed Care – PPO | Admitting: Internal Medicine

## 2013-08-29 ENCOUNTER — Encounter: Payer: Self-pay | Admitting: Internal Medicine

## 2013-08-29 ENCOUNTER — Ambulatory Visit: Payer: BC Managed Care – PPO | Admitting: Internal Medicine

## 2013-08-29 VITALS — BP 168/118 | HR 88 | Temp 98.4°F | Ht 69.0 in | Wt 217.0 lb

## 2013-08-29 DIAGNOSIS — I10 Essential (primary) hypertension: Secondary | ICD-10-CM

## 2013-08-29 MED ORDER — LABETALOL HCL 300 MG PO TABS: 300.0000 mg | ORAL_TABLET | Freq: Two times a day (BID) | ORAL | Status: DC

## 2013-08-29 MED ORDER — VALSARTAN-HYDROCHLOROTHIAZIDE 160-12.5 MG PO TABS: 1.0000 | ORAL_TABLET | Freq: Every day | ORAL | Status: DC

## 2013-08-29 NOTE — Assessment & Plan Note (Signed)
Blood pressures not at goal. Change to valsartan hydrochlorothiazide 160/12.5 mg. Patient understands to increase intake of high potassium foods. Increase labetalol 300 mg twice daily. Reassess in 6 weeks. BP: 168/118 mmHg  BMET within 2 weeks of starting diuretic.

## 2013-08-29 NOTE — Progress Notes (Signed)
Pre visit review using our clinic review tool, if applicable. No additional management support is needed unless otherwise documented below in the visit note. 

## 2013-08-29 NOTE — Patient Instructions (Signed)
Please complete the following lab tests within 2 weeks of starting valsartan/hydrochlorothiazide: BMET - 401.9 Continue to monitor your blood pressure 2-3 times per week and keep a log to bring with you to your next appointment

## 2013-08-29 NOTE — Progress Notes (Signed)
   Subjective:    Patient ID: CLAUDIE RATHBONE, male    DOB: [birth year], 43 y.o.   MRN: [id number]  HPI  43 year old [place] American male with hypertension for followup. Patient has gone through several medication changes due to side effects. Amlodipine discontinued secondary to headache.  Patient switched to Bystolic 10 mg but had to discontinue due to cost reasons. He tried taking valsartan 160 mg and labetalol 200 mg twice daily.  BP was elevated when he was seen by GI physician.   Review of Systems Negative for chest pain.  High stress at work.    Past Medical History  Diagnosis Date  . Hypertension   . Smoker   . Low back pain   . Eczema of hand   . Arthritis     BACK    History   Social History  . Marital Status: Married    Spouse Name: N/A    Number of Children: N/A  . Years of Education: N/A   Occupational History  . Not on file.   Social History Main Topics  . Smoking status: Former Smoker -- 0.50 packs/day    Types: Cigarettes    Start date: 04/30/2012    Quit date: 06/21/2013  . Smokeless tobacco: Never Used  . Alcohol Use: Yes     Comment: occasionaly  . Drug Use: Yes    Special: Marijuana     Comment: Quit smoking & marijuana 1 month ago  . Sexual Activity: Not on file   Other Topics Concern  . Not on file   Social History Narrative   Married   Alcohol use-no      Current Smoker   Occupation: Architect            Past Surgical History  Procedure Laterality Date  . Tonsillectomy    . Upper gastrointestinal endoscopy      Family History  Problem Relation Age of Onset  . Hypertension Mother   . Diabetes Maternal Grandmother   . Stroke Maternal Grandmother   . Other Neg Hx     no family history of colon cancer  . Stomach cancer Neg Hx   . Esophageal cancer Neg Hx   . Prostate cancer Neg Hx   . Colon cancer Paternal Grandfather     No Known Allergies  Current Outpatient Prescriptions on File Prior to Visit  Medication Sig  Dispense Refill  . BYSTOLIC 10 MG tablet Take 1 tablet by mouth daily.      . valsartan (DIOVAN) 160 MG tablet        No current facility-administered medications on file prior to visit.    BP 168/118  Pulse 88  Temp(Src) 98.4 F (36.9 C) (Oral)  Ht 5\' 9"  (1.753 m)  Wt 217 lb (98.431 kg)  BMI 32.03 kg/m2    Objective:   Physical Exam  Constitutional: He is oriented to person, place, and time. He appears well-developed and well-nourished. No distress.  Cardiovascular: Normal rate, regular rhythm and normal heart sounds.   No murmur heard. Pulmonary/Chest: Effort normal and breath sounds normal. He has no wheezes.  Musculoskeletal: He exhibits no edema.  Neurological: He is alert and oriented to person, place, and time. No cranial nerve deficit.  Psychiatric: He has a normal mood and affect. His behavior is normal.          Assessment & Plan:

## 2013-08-30 ENCOUNTER — Ambulatory Visit: Payer: BC Managed Care – PPO | Admitting: Internal Medicine

## 2013-09-04 ENCOUNTER — Ambulatory Visit: Payer: BC Managed Care – PPO | Admitting: Internal Medicine

## 2013-09-12 ENCOUNTER — Telehealth: Payer: Self-pay | Admitting: *Deleted

## 2013-09-12 ENCOUNTER — Other Ambulatory Visit (INDEPENDENT_AMBULATORY_CARE_PROVIDER_SITE_OTHER): Payer: BC Managed Care – PPO

## 2013-09-12 DIAGNOSIS — I10 Essential (primary) hypertension: Secondary | ICD-10-CM

## 2013-09-12 LAB — BASIC METABOLIC PANEL
BUN: 11 mg/dL (ref 6–23)
CO2: 29 meq/L (ref 19–32)
CREATININE: 1 mg/dL (ref 0.4–1.5)
Calcium: 9.2 mg/dL (ref 8.4–10.5)
Chloride: 108 mEq/L (ref 96–112)
GFR: 107.94 mL/min (ref >60.00)
Glucose, Bld: 92 mg/dL (ref 70–99)
Potassium: 3.6 mEq/L (ref 3.5–5.1)
SODIUM: 140 meq/L (ref 135–145)

## 2013-09-12 NOTE — Telephone Encounter (Signed)
Pt states when Dr Shawna Orleans changed his BP medicine he started getting dizzy.  He doesn't know which medicine is causing it.  Please advise, his next appt is 10/11/13

## 2013-09-12 NOTE — Telephone Encounter (Signed)
Have pt check his BP and pulse.  We increased his labetalol to 300 mg bid.

## 2013-09-13 NOTE — Telephone Encounter (Signed)
Pt aware he will call back on Monday with BP readings

## 2013-09-24 ENCOUNTER — Ambulatory Visit: Payer: BC Managed Care – PPO | Admitting: Internal Medicine

## 2013-10-11 ENCOUNTER — Ambulatory Visit: Payer: BC Managed Care – PPO | Admitting: Internal Medicine

## 2013-10-14 ENCOUNTER — Encounter: Payer: Self-pay | Admitting: Internal Medicine

## 2013-10-14 ENCOUNTER — Ambulatory Visit (INDEPENDENT_AMBULATORY_CARE_PROVIDER_SITE_OTHER): Payer: BC Managed Care – PPO | Admitting: Internal Medicine

## 2013-10-14 VITALS — BP 144/96 | HR 84 | Temp 99.1°F | Wt 224.0 lb

## 2013-10-14 DIAGNOSIS — M25512 Pain in left shoulder: Secondary | ICD-10-CM

## 2013-10-14 DIAGNOSIS — I10 Essential (primary) hypertension: Secondary | ICD-10-CM

## 2013-10-14 DIAGNOSIS — R351 Nocturia: Secondary | ICD-10-CM

## 2013-10-14 DIAGNOSIS — M25519 Pain in unspecified shoulder: Secondary | ICD-10-CM

## 2013-10-14 DIAGNOSIS — Z23 Encounter for immunization: Secondary | ICD-10-CM

## 2013-10-14 LAB — POCT URINALYSIS DIPSTICK
Bilirubin, UA: NEGATIVE
Blood, UA: NEGATIVE
Glucose, UA: NEGATIVE
LEUKOCYTES UA: NEGATIVE
Nitrite, UA: NEGATIVE
SPEC GRAV UA: 1.025
Urobilinogen, UA: 1
pH, UA: 5.5

## 2013-10-14 MED ORDER — VALSARTAN-HYDROCHLOROTHIAZIDE 320-12.5 MG PO TABS: 1.0000 | ORAL_TABLET | Freq: Every day | ORAL | Status: DC

## 2013-10-14 MED ORDER — DICLOFENAC SODIUM 1 % TD GEL: 2.0000 g | Freq: Three times a day (TID) | TRANSDERMAL | Status: DC

## 2013-10-14 NOTE — Progress Notes (Signed)
Subjective:    Patient ID: Scott Carson, male    DOB: 07/31/1970, 43 y.o.   MRN: [id number]  HPI  43 year old African American male for followup regarding hypertension. At previous visit his labetalol was increased to 300 mg twice daily. We also added Hydrochlorothiazide to Diovan. Patient complains of nocturia x3. He takes his diuretic in the morning. He drinks significant amount of fluids after he gets home from work.  He denies any other urinary symptoms. His urine stream is strong. He has no difficulty with starting or stopping his stream.  Patient also complains of mild intermittent left shoulder pain. His symptoms worse with lifting his arm above shoulder. He denies any specific injury or trauma.   Review of Systems Negative for polydipsia, negative for daytime urinary frequency.    Past Medical History  Diagnosis Date  . Hypertension   . Smoker   . Low back pain   . Eczema of hand   . Arthritis     BACK    History   Social History  . Marital Status: Married    Spouse Name: N/A    Number of Children: N/A  . Years of Education: N/A   Occupational History  . Not on file.   Social History Main Topics  . Smoking status: Former Smoker -- 0.50 packs/day    Types: Cigarettes    Start date: 04/30/2012    Quit date: 06/21/2013  . Smokeless tobacco: Never Used  . Alcohol Use: Yes     Comment: occasionaly  . Drug Use: Yes    Special: Marijuana     Comment: Quit smoking & marijuana 1 month ago  . Sexual Activity: Not on file   Other Topics Concern  . Not on file   Social History Narrative   Married   Alcohol use-no      Current Smoker   Occupation: Architect            Past Surgical History  Procedure Laterality Date  . Tonsillectomy    . Upper gastrointestinal endoscopy      Family History  Problem Relation Age of Onset  . Hypertension Mother   . Diabetes Maternal Grandmother   . Stroke Maternal Grandmother   . Other Neg Hx     no family  history of colon cancer  . Stomach cancer Neg Hx   . Esophageal cancer Neg Hx   . Prostate cancer Neg Hx   . Colon cancer Paternal Grandfather     No Known Allergies  Current Outpatient Prescriptions on File Prior to Visit  Medication Sig Dispense Refill  . labetalol (NORMODYNE) 300 MG tablet Take 1 tablet (300 mg total) by mouth 2 (two) times daily.  180 tablet  1   No current facility-administered medications on file prior to visit.    BP 144/96  Pulse 84  Temp(Src) 99.1 F (37.3 C) (Oral)  Wt 224 lb (101.606 kg)    Objective:   Physical Exam  Constitutional: He is oriented to person, place, and time. He appears well-developed and well-nourished. No distress.  Cardiovascular: Normal rate, regular rhythm and normal heart sounds.   Pulmonary/Chest: Effort normal and breath sounds normal. He has no wheezes.  Musculoskeletal:  Trace lower extremity edema Patient able to abduct left arm above shoulder 90 with minimal pain. Mild tenderness near [employer] joint with positive impingement sign  Neurological: He is alert and oriented to person, place, and time. No cranial nerve deficit.  Skin: Skin is warm  and dry.  Psychiatric: He has a normal mood and affect. His behavior is normal.          Assessment & Plan:

## 2013-10-14 NOTE — Assessment & Plan Note (Signed)
Mild left shoulder pain consistent with left rotator cuff tendinopathy. Patient advised to avoid NSAIDs considering use of high-dose ARB. Uses Voltaren gel 3 times a day as needed along with cool compresses. If worsening symptoms we discussed referral to sports medicine special (Dr. Tamala Julian) for further evaluation and treatment.

## 2013-10-14 NOTE — Assessment & Plan Note (Signed)
Patient's blood pressure is still suboptimally controlled. His nocturia may be related to start of hydrochlorothiazide. Patient encouraged to drink more fluids during the day and restrict fluid intake after dinner. His urinalysis is unremarkable.  Increase valsartan to 320 mg.  Continue hydrochlorothiazide 12.5 and labetalol 300 mg twice daily.  BP: 144/96 mmHg

## 2013-10-14 NOTE — Patient Instructions (Signed)
Adjust your fluid intake as directed Please contact our office if your symptoms do not improve or gets worse. Please complete the following lab tests before your next follow up appointment: BMET - 401.9

## 2013-10-24 ENCOUNTER — Telehealth: Payer: Self-pay | Admitting: Internal Medicine

## 2013-10-24 NOTE — Telephone Encounter (Signed)
I received PA denial for Voltaren.  When the PA was submitted, I included Dr. Lora Havens note for patient to avoid NSAID but it was still denied due to medication not being FDA approved for hip, spine or shoulder.  I contacted the Utilization Management Customer Service to confirm the denial and was advised of the same information provided above.

## 2013-10-25 NOTE — Telephone Encounter (Signed)
Left message on machine for patient to return our call 

## 2013-10-25 NOTE — Telephone Encounter (Signed)
If pt still experiencing left shoulder pain, I suggest referral to Dr. Charlann Boxer for possible steroid injection.

## 2013-10-25 NOTE — Telephone Encounter (Signed)
Patient will discuss at next office visit with Dr Shawna Orleans.

## 2013-12-12 ENCOUNTER — Ambulatory Visit: Payer: BC Managed Care – PPO | Admitting: Internal Medicine

## 2013-12-18 ENCOUNTER — Encounter: Payer: Self-pay | Admitting: Internal Medicine

## 2013-12-18 ENCOUNTER — Ambulatory Visit (INDEPENDENT_AMBULATORY_CARE_PROVIDER_SITE_OTHER): Payer: BC Managed Care – PPO | Admitting: Internal Medicine

## 2013-12-18 VITALS — BP 142/100 | HR 78 | Temp 98.0°F | Ht 69.0 in | Wt 268.0 lb

## 2013-12-18 DIAGNOSIS — M25512 Pain in left shoulder: Secondary | ICD-10-CM

## 2013-12-18 DIAGNOSIS — I10 Essential (primary) hypertension: Secondary | ICD-10-CM

## 2013-12-18 LAB — BASIC METABOLIC PANEL
BUN: 11 mg/dL (ref 6–23)
CHLORIDE: 105 meq/L (ref 96–112)
CO2: 21 meq/L (ref 19–32)
Calcium: 10 mg/dL (ref 8.4–10.5)
Creatinine, Ser: 1.1 mg/dL (ref 0.4–1.5)
GFR: 91.33 mL/min (ref >60.00)
GLUCOSE: 47 mg/dL — AB (ref 70–99)
POTASSIUM: 3.8 meq/L (ref 3.5–5.1)
SODIUM: 143 meq/L (ref 135–145)

## 2013-12-18 MED ORDER — SPIRONOLACTONE 25 MG PO TABS: 12.5000 mg | ORAL_TABLET | Freq: Every day | ORAL | Status: DC

## 2013-12-18 NOTE — Assessment & Plan Note (Signed)
Patient experiencing persistent discomfort.  Refer to Dr. Tamala Julian for possible cortisone injection.

## 2013-12-18 NOTE — Assessment & Plan Note (Signed)
BP not at goal.  Add low dose spironolactone.  Patient understands risk of hyperkalemia.  Patient advised to follow low K diet.  Monitor electrolytes closely. BP: 142/100 mmHg

## 2013-12-18 NOTE — Patient Instructions (Signed)
Avoid high potassium foods Return in 2 weeks for BMET - 401.9

## 2013-12-18 NOTE — Progress Notes (Signed)
Pre visit review using our clinic review tool, if applicable. No additional management support is needed unless otherwise documented below in the visit note. 

## 2013-12-18 NOTE — Progress Notes (Signed)
   Subjective:    Patient ID: Scott Carson, male    DOB: 1970/03/21, 43 y.o.   MRN: [id number]  HPI  43 year old African-American male for follow-up regarding hypertension. Patient reports taking Diovan hydrochlorothiazide 320/12.5 mg and labetalol 300 mg as directed.   He reports persistent left shoulder discomfort.  Review of Systems Negative for chest pain    Past Medical History  Diagnosis Date  . Hypertension   . Smoker   . Low back pain   . Eczema of hand   . Arthritis     BACK    History   Social History  . Marital Status: Married    Spouse Name: N/A    Number of Children: N/A  . Years of Education: N/A   Occupational History  . Not on file.   Social History Main Topics  . Smoking status: Former Smoker -- 0.50 packs/day    Types: Cigarettes    Start date: 04/30/2012    Quit date: 06/21/2013  . Smokeless tobacco: Never Used  . Alcohol Use: Yes     Comment: occasionaly  . Drug Use: Yes    Special: Marijuana     Comment: Quit smoking & marijuana 1 month ago  . Sexual Activity: Not on file   Other Topics Concern  . Not on file   Social History Narrative   Married   Alcohol use-no      Current Smoker   Occupation: Architect            Past Surgical History  Procedure Laterality Date  . Tonsillectomy    . Upper gastrointestinal endoscopy      Family History  Problem Relation Age of Onset  . Hypertension Mother   . Diabetes Maternal Grandmother   . Stroke Maternal Grandmother   . Other Neg Hx     no family history of colon cancer  . Stomach cancer Neg Hx   . Esophageal cancer Neg Hx   . Prostate cancer Neg Hx   . Colon cancer Paternal Grandfather     No Known Allergies  Current Outpatient Prescriptions on File Prior to Visit  Medication Sig Dispense Refill  . diclofenac sodium (VOLTAREN) 1 % GEL Apply 2 g topically 3 (three) times daily.  2 Tube  1  . labetalol (NORMODYNE) 300 MG tablet Take 1 tablet (300 mg total) by mouth 2 (two)  times daily.  180 tablet  1  . valsartan-hydrochlorothiazide (DIOVAN-HCT) 320-12.5 MG per tablet Take 1 tablet by mouth daily.  90 tablet  1   No current facility-administered medications on file prior to visit.    BP 142/100  Pulse 78  Temp(Src) 98 F (36.7 C) (Oral)  Ht 5\' 9"  (1.753 m)  Wt 268 lb (121.564 kg)  BMI 39.56 kg/m2    Objective:   Physical Exam  Constitutional: He is oriented to person, place, and time. He appears well-developed and well-nourished. No distress.  Cardiovascular: Normal rate, regular rhythm and normal heart sounds.   No murmur heard. Pulmonary/Chest: Effort normal. He has no wheezes.  Musculoskeletal:  Decreased ROM for left shoulder.  Limited abduction  Neurological: He is alert and oriented to person, place, and time. No cranial nerve deficit.  Psychiatric: He has a normal mood and affect. His behavior is normal.          Assessment & Plan:

## 2013-12-19 ENCOUNTER — Other Ambulatory Visit (INDEPENDENT_AMBULATORY_CARE_PROVIDER_SITE_OTHER): Payer: BC Managed Care – PPO

## 2013-12-19 ENCOUNTER — Telehealth: Payer: Self-pay | Admitting: *Deleted

## 2013-12-19 ENCOUNTER — Telehealth: Payer: Self-pay

## 2013-12-19 DIAGNOSIS — E162 Hypoglycemia, unspecified: Secondary | ICD-10-CM

## 2013-12-19 DIAGNOSIS — Z833 Family history of diabetes mellitus: Secondary | ICD-10-CM

## 2013-12-19 LAB — BASIC METABOLIC PANEL
BUN: 11 mg/dL (ref 6–23)
CALCIUM: 9.5 mg/dL (ref 8.4–10.5)
CO2: 23 mEq/L (ref 19–32)
Chloride: 105 mEq/L (ref 96–112)
Creatinine, Ser: 1 mg/dL (ref 0.4–1.5)
GFR: 100.6 mL/min (ref >60.00)
Glucose, Bld: 92 mg/dL (ref 70–99)
POTASSIUM: 4 meq/L (ref 3.5–5.1)
Sodium: 140 mEq/L (ref 135–145)

## 2013-12-19 LAB — HEMOGLOBIN A1C: HEMOGLOBIN A1C: 5.7 % (ref 4.6–6.5)

## 2013-12-19 NOTE — Telephone Encounter (Signed)
Pt has only had hot flashes nothing else.  Maternal Uncle and grandmother have diabetes.  He is coming in this morning for a redraw.

## 2013-12-19 NOTE — Telephone Encounter (Signed)
Message copied by Pearletha Forge on Thu Dec 19, 2013  8:42 AM ------      Message from: Rosine Abe      Created: Wed Dec 18, 2013  9:52 PM       Call pt - inform pt blood work showed low sugar level.  Is he experiencing any symptoms of hypoglycemia (unexplained sweating, heart racing, dizziness)            Is his wife or anyone in his household diabetic? ------

## 2013-12-19 NOTE — Telephone Encounter (Signed)
Called and spoke with pt and pt states he spoke with Dr. Shawna Orleans about this at his last visit on 10/14/13.  Pt states he is having one as we speak and he just feels warm so he turned on the air conditioning at home.  Usually he does not have sweating but he did feel lightheaded one time.  He states the does not happen every time. When it comes to frequency pt states it may happen once a month.  When pt is experiencing a hot flash once he eats and settles down the hot flash subsides. It usually last about 10 minutes. Pls advise.

## 2013-12-19 NOTE — Telephone Encounter (Signed)
Message copied by Colleen Can on Thu Dec 19, 2013  1:34 PM ------      Message from: Rosine Abe      Created: Thu Dec 19, 2013 12:18 PM       When did he experience his hot flash?  How frequently do they occur?  How long do they last? ------

## 2013-12-20 ENCOUNTER — Ambulatory Visit (INDEPENDENT_AMBULATORY_CARE_PROVIDER_SITE_OTHER): Payer: BC Managed Care – PPO | Admitting: Family Medicine

## 2013-12-20 ENCOUNTER — Other Ambulatory Visit (INDEPENDENT_AMBULATORY_CARE_PROVIDER_SITE_OTHER): Payer: BC Managed Care – PPO

## 2013-12-20 ENCOUNTER — Encounter: Payer: Self-pay | Admitting: Family Medicine

## 2013-12-20 VITALS — BP 166/108 | HR 80 | Ht 69.0 in | Wt 218.0 lb

## 2013-12-20 DIAGNOSIS — M25512 Pain in left shoulder: Secondary | ICD-10-CM

## 2013-12-20 DIAGNOSIS — M75102 Unspecified rotator cuff tear or rupture of left shoulder, not specified as traumatic: Secondary | ICD-10-CM | POA: Insufficient documentation

## 2013-12-20 MED ORDER — MELOXICAM 15 MG PO TABS: 15.0000 mg | ORAL_TABLET | Freq: Every day | ORAL | Status: DC

## 2013-12-20 MED ORDER — NITROGLYCERIN 0.2 MG/HR TD PT24: MEDICATED_PATCH | TRANSDERMAL | Status: DC

## 2013-12-20 NOTE — Progress Notes (Signed)
[facility], [place] [address] Phone: [phone] Subjective:    I'm seeing this patient by the request  of: Drema Pry, DO    CC: Left shoulder pain  PPI:[id number] Scott Carson is a 43 y.o. male coming in with complaint of left shoulder pain and weakness. Patient states that this has been a long insidious onset. Patient states it made the multiple months if not years. Patient states a proximal wing one year ago he did have a cortisone injection into the left shoulder. Patient states that it helped some the pain but never came back with some of the weakness. 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 Patient denies any radiation down the hand or any numbness.  Patient states that it is starting to affect his daily activities as well as his job. Patient is very concerned about that. Patient rates the severity of the discomfort as 4 out of 10. Denies any significant nighttime awakening. Patient is starting have similar presentation of the pain in his right shoulder at this time.     Past medical history, social, surgical and family history all reviewed in electronic medical record.   Review of Systems: No headache, visual changes, nausea, vomiting, diarrhea, constipation, dizziness, abdominal pain, skin rash, fevers, chills, night sweats, weight loss, swollen lymph nodes, body aches, joint swelling, muscle aches, chest pain, shortness of breath, mood changes.   Objective Blood pressure 166/108, pulse 80, height 5\' 9"  (1.753 m), weight 218 lb (98.884 kg), SpO2 99.00%.  General: No apparent distress alert and oriented x3 mood and affect normal, dressed appropriately.  HEENT: Pupils equal, extraocular movements intact  Respiratory: Patient's speak in full sentences and does not appear short of breath  Cardiovascular: No lower  extremity edema, non tender, no erythema  Skin: Warm dry intact with no signs of infection or rash on extremities or on axial skeleton.  Abdomen: Soft nontender  Neuro: Cranial nerves II through XII are intact, neurovascularly intact in all extremities with 2+ DTRs and 2+ pulses.  Lymph: No lymphadenopathy of posterior or anterior cervical chain or axillae bilaterally.  Gait normal with good balance and coordination.  MSK:  Non tender with full range of motion and good stability and symmetric strength and tone of  elbows, wrist, hip, knee and ankles bilaterally.  Shoulder: left Inspection reveals no abnormalities, atrophy or asymmetry. Palpation is normal with no tenderness over AC joint or bicipital groove. ROM is full in all planes passively. Rotator cuff strength shows 3 out of 5 strength compared to the contralateral side that has 5 out 5 strength. signs of impingement with positive Neer and Hawkin's tests, but negative empty can sign. Speeds and Yergason's tests normal. No labral pathology noted with negative Obrien's, negative clunk and good stability. Normal scapular function observed. No painful arc and no drop arm sign. No apprehension sign  MSK US performed of: left This study was ordered, performed, and interpreted by Charlann Boxer D.O.  Shoulder:   Supraspinatus:  Patient does have what appears to be a full-thickness tear but no retraction. Significant increasing upper flow noted. Infraspinatus:  Appears normal on long and transverse views. Significant increase in Doppler flow Subscapularis:   Teres Minor:  Appears normal on long and transverse views. AC joint:  Capsule undistended, no geyser sign. Glenohumeral Joint:  Appears normal without effusion. Glenoid  Labrum:  Intact without visualized tears. Biceps Tendon:  Appears normal on long and transverse views, no fraying of tendon, tendon located in intertubercular groove, no subluxation with shoulder internal or external  rotation.  Impression: Rotator cuff tear with 0.75 cm of retraction of if her spinatus and full-thickness tear of the supraspinatus with no true retraction noted.     Impression and Recommendations:     This case required medical decision making of moderate complexity.

## 2013-12-20 NOTE — Patient Instructions (Signed)
Good to meet you Ice 20 minutes 2 times daily. Usually after activity and before bed. Exercises 3 times a week.  Meloxicam daily for 10 days then as needed Nitroglycerin Protocol   Apply 1/4 nitroglycerin patch to affected area daily.  Change position of patch within the affected area every 24 hours.  You may experience a headache during the first 1-2 weeks of using the patch, these should subside.  If you experience headaches after beginning nitroglycerin patch treatment, you may take your preferred over the counter pain reliever.  Another side effect of the nitroglycerin patch is skin irritation or rash related to patch adhesive.  Please notify our office if you develop more severe headaches or rash, and stop the patch.  Tendon healing with nitroglycerin patch may require 12 to 24 weeks depending on the extent of injury.  Men should not use if taking Viagra, Cialis, or Levitra.   Do not use if you have migraines or rosacea.

## 2013-12-20 NOTE — Telephone Encounter (Signed)
appt scheduled

## 2013-12-20 NOTE — Assessment & Plan Note (Signed)
Patient does have a tear with some very mild retraction of 0.75 cm of the infraspinatus. No significant retraction of the supra spinatus at this time. Patient has elected to try conservative therapy but we will add nitroglycerin patches. Patient was warned of potential side effects and not to mix with any other type of medications such as Viagra. Patient given home exercise program as well as an icing protocol. Patient given a ten-day trial of anti-inflammatories to decrease any information. Patient then is to follow-up with me in 3 weeks. Continuing to have discomfort or pain we may need to consider intra-articular injection as well as possibly formal physical therapy. The patient has severe weakness in the interim we may also need to consider advanced imaging.

## 2013-12-20 NOTE — Telephone Encounter (Signed)
Have pt schedule OV to discuss whether these episodes are actually from hypoglycemia

## 2013-12-27 ENCOUNTER — Ambulatory Visit (INDEPENDENT_AMBULATORY_CARE_PROVIDER_SITE_OTHER): Payer: BC Managed Care – PPO | Admitting: Internal Medicine

## 2013-12-27 ENCOUNTER — Encounter: Payer: Self-pay | Admitting: Internal Medicine

## 2013-12-27 VITALS — BP 140/90 | Temp 97.9°F | Ht 69.0 in | Wt 218.0 lb

## 2013-12-27 DIAGNOSIS — E162 Hypoglycemia, unspecified: Secondary | ICD-10-CM

## 2013-12-27 DIAGNOSIS — I10 Essential (primary) hypertension: Secondary | ICD-10-CM

## 2013-12-27 MED ORDER — SPIRONOLACTONE 25 MG PO TABS: 25.0000 mg | ORAL_TABLET | Freq: Every day | ORAL | Status: DC

## 2013-12-27 NOTE — Assessment & Plan Note (Signed)
43 year old African-American male has had 2 significant low blood sugar readings within the past 6 months. He reports intermittent hot flashes. Unclear whether these are symptoms that represent hypoglycemia. Check C-peptide level. Consider referral to endocrinology for further evaluation.

## 2013-12-27 NOTE — Progress Notes (Signed)
Subjective:    Patient ID: Scott Carson, male    DOB: 1970-08-28, 43 y.o.   MRN: [id number]  HPI  43 year old African-American male with history of hypertension for follow-up regarding abnormal lab values.  Over the past 6 months patient has had 2 significant low blood sugar blood test results. Patient reports he is fairly asymptomatic. He denies any symptoms of diaphoresis or dizziness. He does report experiencing intermittent hot flashes.  This has been more noticeable over the last 1-2 months.  His last hot flash occurred last night.  Review of Systems No household members have type 2 diabetes    Past Medical History  Diagnosis Date  . Hypertension   . Smoker   . Low back pain   . Eczema of hand   . Arthritis     BACK    History   Social History  . Marital Status: Married    Spouse Name: N/A    Number of Children: N/A  . Years of Education: N/A   Occupational History  . Not on file.   Social History Main Topics  . Smoking status: Former Smoker -- 0.50 packs/day    Types: Cigarettes    Start date: 04/30/2012    Quit date: 06/21/2013  . Smokeless tobacco: Never Used  . Alcohol Use: Yes     Comment: occasionaly  . Drug Use: Yes    Special: Marijuana     Comment: Quit smoking & marijuana 1 month ago  . Sexual Activity: Not on file   Other Topics Concern  . Not on file   Social History Narrative   Married   Alcohol use-no      Current Smoker   Occupation: Architect            Past Surgical History  Procedure Laterality Date  . Tonsillectomy    . Upper gastrointestinal endoscopy      Family History  Problem Relation Age of Onset  . Hypertension Mother   . Diabetes Maternal Grandmother   . Stroke Maternal Grandmother   . Other Neg Hx     no family history of colon cancer  . Stomach cancer Neg Hx   . Esophageal cancer Neg Hx   . Prostate cancer Neg Hx   . Colon cancer Paternal Grandfather     No Known Allergies  Current Outpatient  Prescriptions on File Prior to Visit  Medication Sig Dispense Refill  . diclofenac sodium (VOLTAREN) 1 % GEL Apply 2 g topically 3 (three) times daily. 2 Tube 1  . labetalol (NORMODYNE) 300 MG tablet Take 1 tablet (300 mg total) by mouth 2 (two) times daily. 180 tablet 1  . meloxicam (MOBIC) 15 MG tablet Take 1 tablet (15 mg total) by mouth daily. 30 tablet 0  . nitroGLYCERIN (NITRODUR - DOSED IN MG/24 HR) 0.2 mg/hr patch 1/4 patch daily 30 patch 1  . valsartan-hydrochlorothiazide (DIOVAN-HCT) 320-12.5 MG per tablet Take 1 tablet by mouth daily. 90 tablet 1   No current facility-administered medications on file prior to visit.    BP 140/90 mmHg  Temp(Src) 97.9 F (36.6 C) (Oral)  Ht 5\' 9"  (1.753 m)  Wt 218 lb (98.884 kg)  BMI 32.18 kg/m2      Objective:   Physical Exam  Constitutional: He is oriented to person, place, and time. He appears well-developed and well-nourished. No distress.  Cardiovascular: Normal rate, regular rhythm and normal heart sounds.   No murmur heard. Pulmonary/Chest: Effort normal and breath sounds  normal. He has no wheezes.  Musculoskeletal: He exhibits no edema.  Neurological: He is alert and oriented to person, place, and time. No cranial nerve deficit.          Assessment & Plan:

## 2013-12-27 NOTE — Progress Notes (Signed)
Pre visit review using our clinic review tool, if applicable. No additional management support is needed unless otherwise documented below in the visit note. 

## 2013-12-27 NOTE — Assessment & Plan Note (Signed)
BP still not at goal.  Increase spironolactone to 25 mg once daily.  Monitor electrolytes. BP: 140/90 mmHg

## 2013-12-27 NOTE — Patient Instructions (Signed)
Please complete the following lab tests within 1 month: BMET - 401.9

## 2013-12-28 LAB — C-PEPTIDE: C-Peptide: 1.9 ng/mL (ref 0.80–3.90)

## 2014-01-24 ENCOUNTER — Encounter: Payer: Self-pay | Admitting: Physician Assistant

## 2014-01-24 ENCOUNTER — Ambulatory Visit: Payer: BC Managed Care – PPO | Admitting: Internal Medicine

## 2014-01-24 ENCOUNTER — Ambulatory Visit (INDEPENDENT_AMBULATORY_CARE_PROVIDER_SITE_OTHER): Payer: BC Managed Care – PPO | Admitting: Physician Assistant

## 2014-01-24 VITALS — BP 147/102 | HR 89 | Temp 97.9°F | Wt 217.0 lb

## 2014-01-24 DIAGNOSIS — R42 Dizziness and giddiness: Secondary | ICD-10-CM

## 2014-01-24 MED ORDER — SPIRONOLACTONE 25 MG PO TABS: 25.0000 mg | ORAL_TABLET | Freq: Every day | ORAL | Status: DC

## 2014-01-24 NOTE — Patient Instructions (Signed)
Please resume the Aldactone in addition to your other BP medications. Limit salt intake (Read information below).  Stop the nitroglycerin Patch as your symptoms seem to be a side effect of the medication.  Follow-up with Dr. Tamala Julian as scheduled.  Follow-up with myself or Dr. Shawna Orleans in 3-4 weeks for a BP recheck.   DASH Eating Plan DASH stands for "Dietary Approaches to Stop Hypertension." The DASH eating plan is a healthy eating plan that has been shown to reduce high blood pressure (hypertension). Additional health benefits may include reducing the risk of type 2 diabetes mellitus, heart disease, and stroke. The DASH eating plan may also help with weight loss. WHAT DO I NEED TO KNOW ABOUT THE DASH EATING PLAN? For the DASH eating plan, you will follow these general guidelines:  Choose foods with a percent daily value for sodium of less than 5% (as listed on the food label).  Use salt-free seasonings or herbs instead of table salt or sea salt.  Check with your health care provider or pharmacist before using salt substitutes.  Eat lower-sodium products, often labeled as "lower sodium" or "no salt added."  Eat fresh foods.  Eat more vegetables, fruits, and low-fat dairy products.  Choose whole grains. Look for the word "whole" as the first word in the ingredient list.  Choose fish and skinless chicken or [place] more often than red meat. Limit fish, poultry, and meat to 6 oz (170 g) each day.  Limit sweets, desserts, sugars, and sugary drinks.  Choose heart-healthy fats.  Limit cheese to 1 oz (28 g) per day.  Eat more home-cooked food and less restaurant, buffet, and fast food.  Limit fried foods.  Cook foods using methods other than frying.  Limit canned vegetables. If you do use them, rinse them well to decrease the sodium.  When eating at a restaurant, ask that your food be prepared with less salt, or no salt if possible. WHAT FOODS CAN I EAT? Seek help from a dietitian for  individual calorie needs. Grains Whole grain or whole wheat bread. Brown rice. Whole grain or whole wheat pasta. Quinoa, bulgur, and whole grain cereals. Low-sodium cereals. Corn or whole wheat flour tortillas. Whole grain cornbread. Whole grain crackers. Low-sodium crackers. Vegetables Fresh or frozen vegetables (raw, steamed, roasted, or grilled). Low-sodium or reduced-sodium tomato and vegetable juices. Low-sodium or reduced-sodium tomato sauce and paste. Low-sodium or reduced-sodium canned vegetables.  Fruits All fresh, canned (in natural juice), or frozen fruits. Meat and Other Protein Products Ground beef (85% or leaner), grass-fed beef, or beef trimmed of fat. Skinless chicken or [place]. Ground chicken or [place]. Pork trimmed of fat. All fish and seafood. Eggs. Dried beans, peas, or lentils. Unsalted nuts and seeds. Unsalted canned beans. Dairy Low-fat dairy products, such as skim or 1% milk, 2% or reduced-fat cheeses, low-fat ricotta or cottage cheese, or plain low-fat yogurt. Low-sodium or reduced-sodium cheeses. Fats and Oils Tub margarines without trans fats. Light or reduced-fat mayonnaise and salad dressings (reduced sodium). Avocado. Safflower, olive, or canola oils. Natural peanut or almond butter. Other Unsalted popcorn and pretzels. The items listed above may not be a complete list of recommended foods or beverages. Contact your dietitian for more options. WHAT FOODS ARE NOT RECOMMENDED? Grains [place] bread. [place] pasta. Sferrazza rice. Refined cornbread. Bagels and croissants. Crackers that contain trans fat. Vegetables Creamed or fried vegetables. Vegetables in a cheese sauce. Regular canned vegetables. Regular canned tomato sauce and paste. Regular tomato and vegetable juices. Fruits Dried fruits.  Canned fruit in light or heavy syrup. Fruit juice. Meat and Other Protein Products Fatty cuts of meat. Ribs, chicken wings, bacon, sausage, bologna, salami, chitterlings, fatback, hot  dogs, bratwurst, and packaged luncheon meats. Salted nuts and seeds. Canned beans with salt. Dairy Whole or 2% milk, cream, half-and-half, and cream cheese. Whole-fat or sweetened yogurt. Full-fat cheeses or blue cheese. Nondairy creamers and whipped toppings. Processed cheese, cheese spreads, or cheese curds. Condiments Onion and garlic salt, seasoned salt, table salt, and sea salt. Canned and packaged gravies. Worcestershire sauce. Tartar sauce. Barbecue sauce. Teriyaki sauce. Soy sauce, including reduced sodium. Steak sauce. Fish sauce. Oyster sauce. Cocktail sauce. Horseradish. Ketchup and mustard. Meat flavorings and tenderizers. Bouillon cubes. Hot sauce. Tabasco sauce. Marinades. Taco seasonings. Relishes. Fats and Oils Butter, stick margarine, lard, shortening, ghee, and bacon fat. Coconut, palm kernel, or palm oils. Regular salad dressings. Other Pickles and olives. Salted popcorn and pretzels. The items listed above may not be a complete list of foods and beverages to avoid. Contact your dietitian for more information. WHERE CAN I FIND MORE INFORMATION? National Heart, Lung, and Blood Institute: travelstabloid.com Document Released: 01/27/2011 Document Revised: 06/24/2013 Document Reviewed: 12/12/2012 [facility] Patient Information 2015 [place], [place]. This information is not intended to replace advice given to you by your health care provider. Make sure you discuss any questions you have with your health care provider.

## 2014-01-24 NOTE — Progress Notes (Signed)
Patient presents to clinic today c/o an episode of dizziness yesterday after applying his nitro Patch for pain.  Patient also with history of hypertension and has been out of his aldactone. Is taking other BP medications as directed.  Denies URI symptoms, recent concussion, chest pain, palpitations, lightheadedness or vision changes.  Past Medical History  Diagnosis Date  . Hypertension   . Smoker   . Low back pain   . Eczema of hand   . Arthritis     BACK    Current Outpatient Prescriptions on File Prior to Visit  Medication Sig Dispense Refill  . labetalol (NORMODYNE) 300 MG tablet Take 1 tablet (300 mg total) by mouth 2 (two) times daily. 180 tablet 1  . valsartan-hydrochlorothiazide (DIOVAN-HCT) 320-12.5 MG per tablet Take 1 tablet by mouth daily. 90 tablet 1  . nitroGLYCERIN (NITRODUR - DOSED IN MG/24 HR) 0.2 mg/hr patch 1/4 patch daily (Patient not taking: Reported on 01/24/2014) 30 patch 1  . omeprazole (PRILOSEC) 20 MG capsule Take 1 capsule by mouth daily.  10   No current facility-administered medications on file prior to visit.    No Known Allergies  Family History  Problem Relation Age of Onset  . Hypertension Mother   . Diabetes Maternal Grandmother   . Stroke Maternal Grandmother   . Other Neg Hx     no family history of colon cancer  . Stomach cancer Neg Hx   . Esophageal cancer Neg Hx   . Prostate cancer Neg Hx   . Colon cancer Paternal Grandfather     History   Social History  . Marital Status: Married    Spouse Name: N/A    Number of Children: N/A  . Years of Education: N/A   Social History Main Topics  . Smoking status: Former Smoker -- 0.50 packs/day    Types: Cigarettes    Start date: 04/30/2012    Quit date: 06/21/2013  . Smokeless tobacco: Never Used  . Alcohol Use: Yes     Comment: occasionaly  . Drug Use: Yes    Special: Marijuana     Comment: Quit smoking & marijuana 1 month ago  . Sexual Activity: None   Other Topics Concern  .  None   Social History Narrative   Married   Alcohol use-no      Current Smoker   Occupation: Architect           Review of Systems - See HPI.  All other ROS are negative.  BP 147/102 mmHg  Pulse 89  Temp(Src) 97.9 F (36.6 C)  Wt 217 lb (98.431 kg)  SpO2 100%  Physical Exam  Constitutional: He is oriented to person, place, and time and well-developed, well-nourished, and in no distress.  HENT:  Head: Normocephalic and atraumatic.  Right Ear: External ear normal.  Left Ear: External ear normal.  Nose: Nose normal.  Mouth/Throat: Oropharynx is clear and moist. No oropharyngeal exudate.  TM within normal limits bilaterally.  [employer].  Eyes: Conjunctivae are normal. Pupils are equal, round, and reactive to light.  Neck: Neck supple.  Cardiovascular: Normal rate, regular rhythm, normal heart sounds and intact distal pulses.   Pulmonary/Chest: Effort normal and breath sounds normal. No respiratory distress. He has no wheezes. He has no rales. He exhibits no tenderness.  Lymphadenopathy:    He has no cervical adenopathy.  Neurological: He is alert and oriented to person, place, and time.  Skin: Skin is warm and dry. No rash  noted.  Psychiatric: Affect normal.  Vitals reviewed.   Recent Results (from the past 2160 hour(s))  Basic metabolic panel     Status: Abnormal   Collection Time: 12/18/13  1:49 PM  Result Value Ref Range   Sodium 143 135 - 145 mEq/L   Potassium 3.8 3.5 - 5.1 mEq/L   Chloride 105 96 - 112 mEq/L   CO2 21 19 - 32 mEq/L   Glucose, Bld 47 (LL) 70 - 99 mg/dL   BUN 11 6 - 23 mg/dL   Creatinine, Ser 1.1 0.4 - 1.5 mg/dL   Calcium 10.0 8.4 - 10.5 mg/dL   GFR 91.33 >60.00 mL/min  Hemoglobin A1c     Status: None   Collection Time: 12/19/13  9:10 AM  Result Value Ref Range   Hgb A1c MFr Bld 5.7 4.6 - 6.5 %    Comment: Glycemic Control Guidelines for People with Diabetes:Non Diabetic:  <6%Goal of Therapy: <7%Additional Action Suggested:  >8%     Basic Metabolic Panel     Status: None   Collection Time: 12/19/13  9:10 AM  Result Value Ref Range   Sodium 140 135 - 145 mEq/L   Potassium 4.0 3.5 - 5.1 mEq/L   Chloride 105 96 - 112 mEq/L   CO2 23 19 - 32 mEq/L   Glucose, Bld 92 70 - 99 mg/dL   BUN 11 6 - 23 mg/dL   Creatinine, Ser 1.0 0.4 - 1.5 mg/dL   Calcium 9.5 8.4 - 10.5 mg/dL   GFR 100.60 >60.00 mL/min  C-peptide     Status: None   Collection Time: 12/27/13  4:30 PM  Result Value Ref Range   C-Peptide 1.90 0.80 - 3.90 ng/mL    Assessment/Plan: Dizziness Isolated episode likely secondary to drug reaction -- Nitro Patch. Resolved.  Exam unremarkable. Discontinue medications.  Resume Aldactone for tighter BP control.  Follow-up in 3-4 weeks for BP recheck and BMP.

## 2014-01-24 NOTE — Progress Notes (Signed)
Pre visit review using our clinic review tool, if applicable. No additional management support is needed unless otherwise documented below in the visit note. 

## 2014-01-24 NOTE — Assessment & Plan Note (Signed)
Isolated episode likely secondary to drug reaction -- Nitro Patch. Resolved.  Exam unremarkable. Discontinue medications.  Resume Aldactone for tighter BP control.  Follow-up in 3-4 weeks for BP recheck and BMP.

## 2014-01-31 ENCOUNTER — Ambulatory Visit (INDEPENDENT_AMBULATORY_CARE_PROVIDER_SITE_OTHER): Payer: BC Managed Care – PPO | Admitting: Family Medicine

## 2014-01-31 ENCOUNTER — Encounter: Payer: Self-pay | Admitting: Family Medicine

## 2014-01-31 VITALS — BP 112/82 | HR 90 | Ht 69.0 in | Wt 219.0 lb

## 2014-01-31 DIAGNOSIS — M75102 Unspecified rotator cuff tear or rupture of left shoulder, not specified as traumatic: Secondary | ICD-10-CM

## 2014-01-31 NOTE — Progress Notes (Signed)
[facility] [address], [place] [address] Phone: [phone] Subjective:    CC: Left shoulder pain follow up  [id number] Scott Carson is a 43 y.o. male coming in with complaint of left shoulder pain and weakness. Patient was seen previously and did have a rotator cuff tear. Patient elected to try conservative therapy. Patient was started on nitroglycerin, home exercises, icing protocol we discussed over-the-counter medications. Patient states he did not make any significant improvement. Continues to have pain as well as weakness. Patient is able to continue working but does have discomfort. Patient was unable to tolerate the nitroglycerin patch.    Past medical history, social, surgical and family history all reviewed in electronic medical record.   Review of Systems: No headache, visual changes, nausea, vomiting, diarrhea, constipation, dizziness, abdominal pain, skin rash, fevers, chills, night sweats, weight loss, swollen lymph nodes, body aches, joint swelling, muscle aches, chest pain, shortness of breath, mood changes.   Objective Blood pressure 112/82, pulse 90, height 5\' 9"  (1.753 m), weight 219 lb (99.338 kg), SpO2 9 %.  General: No apparent distress alert and oriented x3 mood and affect normal, dressed appropriately.  HEENT: Pupils equal, extraocular movements intact  Respiratory: Patient's speak in full sentences and does not appear short of breath  Cardiovascular: No lower extremity edema, non tender, no erythema  Skin: Warm dry intact with no signs of infection or rash on extremities or on axial skeleton.  Abdomen: Soft nontender  Neuro: Cranial nerves II through XII are intact, neurovascularly intact in all extremities with 2+ DTRs and 2+ pulses.  Lymph: No lymphadenopathy of posterior or anterior cervical chain or axillae bilaterally.  Gait normal with good balance and coordination.  MSK:  Non tender with full range of motion and  good stability and symmetric strength and tone of  elbows, wrist, hip, knee and ankles bilaterally.  Shoulder: left Inspection reveals no abnormalities, atrophy or asymmetry. Palpation is normal with no tenderness over AC joint or bicipital groove. ROM is full in all planes passively. Rotator cuff strength shows 4 out of 5 strength compared to the contralateral side that has 5 out 5 strength but this is somewhat improved from previous exam. signs of impingement with positive Neer and Hawkin's tests, but negative empty can sign. Speeds and Yergason's tests normal. No labral pathology noted with negative Obrien's, negative clunk and good stability. Normal scapular function observed. No painful arc and no drop arm sign. No apprehension sign  MSK US performed of: left This study was ordered, performed, and interpreted by Charlann Boxer D.O.  Shoulder:   Supraspinatus:  Patient does have what appears to be a full-thickness tear but no retraction. Significant increasing upper flow noted. Infraspinatus:  Appears normal on long and transverse views. Significant increase in Doppler flow Subscapularis:   Teres Minor:  Appears normal on long and transverse views. AC joint:  Capsule undistended, no geyser sign. Glenohumeral Joint:  Appears normal without effusion. Glenoid Labrum:  Intact without visualized tears. Biceps Tendon:  Appears normal on long and transverse views, no fraying of tendon, tendon located in intertubercular groove, no subluxation with shoulder internal or external rotation.  Impression: Rotator cuff tear with healing and minimal retraction noted.  Procedure: Real-time Ultrasound Guided Injection of left glenohumeral joint Device: GE Logiq E  Ultrasound guided injection is preferred based studies that show increased duration, increased effect, greater accuracy, decreased procedural pain, increased response rate with ultrasound guided versus blind injection.  Verbal informed consent  obtained.  Time-out conducted.  Noted no overlying erythema, induration, or other signs of local infection.  Skin prepped in a sterile fashion.  Local anesthesia: Topical Ethyl chloride.  With sterile technique and under real time ultrasound guidance:  Joint visualized.  23g 1  inch needle inserted posterior approach. Pictures taken for needle placement. Patient did have injection of 2 cc of 1% lidocaine, 2 cc of 0.5% Marcaine, and 1cc of Kenalog 40 mg/dL. Completed without difficulty  Pain immediately resolved suggesting accurate placement of the medication.  Advised to call if fevers/chills, erythema, induration, drainage, or persistent bleeding.  Images permanently stored and available for review in the ultrasound unit.  Impression: Technically successful ultrasound guided injection.     Impression and Recommendations:     This case required medical decision making of moderate complexity.

## 2014-01-31 NOTE — Patient Instructions (Signed)
Good to see you Did an injection today.  Try pennsaid topically 2 times daily as needed Ice is your friend Avoid heavy lifting this weekend.  Call next week or so and if not better we need an MRI and then I would 1-2 days after mRI like ot see you to discuss.

## 2014-01-31 NOTE — Assessment & Plan Note (Signed)
Patient actually has been healing somewhat with conservative therapy and patient did have increasing strength after the injection which make me feel some of the pain is actually keeping him from the strength. We discussed continuing the icing and home exercises. Patient declined formal physical therapy again. Patient wants to avoid surgical repair had an hoping we are going to be able to do this. Patient will come back and see me again in 3-4 weeks for further evaluation.  Spent greater than 25 minutes with patient face-to-face and had greater than 50% of counseling including as described above in assessment and plan.

## 2014-02-04 ENCOUNTER — Telehealth: Payer: Self-pay | Admitting: Internal Medicine

## 2014-02-04 DIAGNOSIS — M75102 Unspecified rotator cuff tear or rupture of left shoulder, not specified as traumatic: Secondary | ICD-10-CM

## 2014-02-04 NOTE — Telephone Encounter (Signed)
Pt called in said that shot didn't work and wants to know what he should do now???

## 2014-02-05 NOTE — Telephone Encounter (Signed)
Spoke to pt, he would like to go ahead with an MRI. Order entered.

## 2014-02-05 NOTE — Telephone Encounter (Signed)
Would consider MRI to see if surgery is needed.

## 2014-02-26 ENCOUNTER — Ambulatory Visit: Payer: BC Managed Care – PPO | Admitting: Internal Medicine

## 2014-02-28 ENCOUNTER — Ambulatory Visit: Payer: BC Managed Care – PPO | Admitting: Internal Medicine

## 2014-03-06 ENCOUNTER — Encounter: Payer: Self-pay | Admitting: Family Medicine

## 2014-03-06 ENCOUNTER — Ambulatory Visit (INDEPENDENT_AMBULATORY_CARE_PROVIDER_SITE_OTHER): Payer: BLUE CROSS/BLUE SHIELD | Admitting: Family Medicine

## 2014-03-06 VITALS — BP 115/78 | HR 84 | Temp 97.8°F | Wt 212.0 lb

## 2014-03-06 DIAGNOSIS — I10 Essential (primary) hypertension: Secondary | ICD-10-CM

## 2014-03-06 DIAGNOSIS — H8110 Benign paroxysmal vertigo, unspecified ear: Secondary | ICD-10-CM

## 2014-03-06 NOTE — Progress Notes (Signed)
   Subjective:    Patient ID: Scott Carson, male    DOB: 03-Sep-1970, 44 y.o.   MRN: [id number]  HPI Patient seen with initial concern for possible elevated blood pressure. He has hypertension treated with Aldactone, valsartan HCTZ, and labetalol. Previous intolerance with amlodipine. Does not monitor blood pressure consistently. He had a few episodes recently early morning where he felt dizzy. This sounded like vertigo. No syncope or presyncope. He was concerned that his blood pressure may be causing the symptoms. He denied any speech changes, focal weakness, or any ataxia. No sudden hearing changes. Couple episodes of mild nausea without vomiting. Currently not having any dizziness. Symptoms tend to get better as the day progresses. No recent chest pains.  Past Medical History  Diagnosis Date  . Hypertension   . Smoker   . Low back pain   . Eczema of hand   . Arthritis     BACK   Past Surgical History  Procedure Laterality Date  . Tonsillectomy    . Upper gastrointestinal endoscopy      reports that he quit smoking about 8 months ago. His smoking use included Cigarettes. He started smoking about 22 months ago. He smoked 0.50 packs per day. He has never used smokeless tobacco. He reports that he drinks alcohol. He reports that he uses illicit drugs (Marijuana). family history includes Colon cancer in his paternal grandfather; Diabetes in his maternal grandmother; Hypertension in his mother; Stroke in his maternal grandmother. There is no history of Other, Stomach cancer, Esophageal cancer, or Prostate cancer. No Known Allergies    Review of Systems  Constitutional: Negative for fever, chills, appetite change, fatigue and unexpected weight change.  Eyes: Negative for visual disturbance.  Respiratory: Negative for cough, chest tightness and shortness of breath.   Cardiovascular: Negative for chest pain, palpitations and leg swelling.  Neurological: Positive for dizziness. Negative for  syncope, weakness, light-headedness and headaches.       Objective:   Physical Exam  Constitutional: He is oriented to person, place, and time. He appears well-developed and well-nourished.  HENT:  Moderate cerumen both canals  Neck: Neck supple. No thyromegaly present.  Cardiovascular: Normal rate and regular rhythm.   Pulmonary/Chest: Effort normal and breath sounds normal. No respiratory distress. He has no wheezes. He has no rales.  Musculoskeletal: He exhibits no edema.  Neurological: He is alert and oriented to person, place, and time. No cranial nerve deficit. Coordination normal.  Gait normal. No focal strength deficit.          Assessment & Plan:  #1 hypertension. Well controlled by readings here. Left arm seated 115/80 and right arm seated 117/78. Continue current medications #2 dizziness. By description vertigo. Nonfocal exam. Observe for now. Suspect benign peripheral positional vertigo. No red flags for worrisome vertigo. We reviewed things to watch out for.

## 2014-03-06 NOTE — Progress Notes (Signed)
Pre visit review using our clinic review tool, if applicable. No additional management support is needed unless otherwise documented below in the visit note. 

## 2014-03-06 NOTE — Patient Instructions (Signed)

## 2014-03-26 ENCOUNTER — Ambulatory Visit: Payer: Self-pay | Admitting: Internal Medicine

## 2014-04-07 ENCOUNTER — Other Ambulatory Visit: Payer: Self-pay | Admitting: Internal Medicine

## 2014-04-08 ENCOUNTER — Other Ambulatory Visit: Payer: Self-pay | Admitting: Internal Medicine

## 2014-04-23 ENCOUNTER — Ambulatory Visit (INDEPENDENT_AMBULATORY_CARE_PROVIDER_SITE_OTHER): Payer: BLUE CROSS/BLUE SHIELD | Admitting: Internal Medicine

## 2014-04-23 ENCOUNTER — Encounter: Payer: Self-pay | Admitting: Internal Medicine

## 2014-04-23 VITALS — BP 136/96 | HR 83 | Temp 98.0°F | Ht 69.0 in | Wt 221.0 lb

## 2014-04-23 DIAGNOSIS — L309 Dermatitis, unspecified: Secondary | ICD-10-CM

## 2014-04-23 DIAGNOSIS — I10 Essential (primary) hypertension: Secondary | ICD-10-CM

## 2014-04-23 LAB — BASIC METABOLIC PANEL
BUN: 11 mg/dL (ref 6–23)
CALCIUM: 9.6 mg/dL (ref 8.4–10.5)
CO2: 28 mEq/L (ref 19–32)
Chloride: 104 mEq/L (ref 96–112)
Creatinine, Ser: 0.91 mg/dL (ref 0.40–1.50)
GFR: 115.88 mL/min (ref >60.00)
GLUCOSE: 86 mg/dL (ref 70–99)
Potassium: 3.6 mEq/L (ref 3.5–5.1)
Sodium: 137 mEq/L (ref 135–145)

## 2014-04-23 MED ORDER — SPIRONOLACTONE 25 MG PO TABS: 25.0000 mg | ORAL_TABLET | Freq: Every day | ORAL | Status: DC

## 2014-04-23 MED ORDER — DOXYCYCLINE HYCLATE 100 MG PO TABS: 100.0000 mg | ORAL_TABLET | Freq: Two times a day (BID) | ORAL | Status: DC

## 2014-04-23 MED ORDER — VALSARTAN-HYDROCHLOROTHIAZIDE 320-25 MG PO TABS: 1.0000 | ORAL_TABLET | Freq: Every day | ORAL | Status: DC

## 2014-04-23 NOTE — Assessment & Plan Note (Signed)
Patient reports chronic hand dermatitis or a several years. His symptom is present secondary to eczema. He has responded in the past to triamcinolone ointment. He recalls seeing dermatology in the past. Patient not sure whether skin scraping positive for fungal dermatitis. He is currently having pain of his right hypothenar eminence. Patient may have developed secondary bacterial infection consider hx of cracks and fissures of hands.  Treat with doxycycline 100 mg twice daily for 10 days.  Refer to Dr. Allyson Sabal for further evaluation and treatment.

## 2014-04-23 NOTE — Patient Instructions (Signed)
Please complete the following lab tests before your next follow up appointment: BMET - 401.9 Our office will contact you regarding referral to dermatology

## 2014-04-23 NOTE — Progress Notes (Signed)
Subjective:    Patient ID: Scott Carson, male    DOB: 14-May-1970, 44 y.o.   MRN: [id number]  HPI  44 year old African-American male with difficult to control hypertension and chronic hand dermatitis for follow-up.  Patient reports being promoted at work. Work stress is worse.  His BP is stable.  Good compliance with medications.  He has history of chronic hand dermatitis. He reports painful area near his hypothenar eminence.  The skin over his hands often split from dryness.  He reports he was evaluated by dermatologist in the past. He does not recall exact diagnosis. He has had some improvement with triamcinolone ointment for presumed eczema in the past.  Review of Systems Negative for chest pain,  Hand pain    Past Medical History  Diagnosis Date  . Hypertension   . Smoker   . Low back pain   . Eczema of hand   . Arthritis     BACK    History   Social History  . Marital Status: Married    Spouse Name: N/A  . Number of Children: N/A  . Years of Education: N/A   Occupational History  . Not on file.   Social History Main Topics  . Smoking status: Former Smoker -- 0.50 packs/day    Types: Cigarettes    Start date: 04/30/2012    Quit date: 06/21/2013  . Smokeless tobacco: Never Used  . Alcohol Use: Yes     Comment: occasionaly  . Drug Use: Yes    Special: Marijuana     Comment: Quit smoking & marijuana 1 month ago  . Sexual Activity: Not on file   Other Topics Concern  . Not on file   Social History Narrative   Married   Alcohol use-no      Current Smoker   Occupation: Architect            Past Surgical History  Procedure Laterality Date  . Tonsillectomy    . Upper gastrointestinal endoscopy      Family History  Problem Relation Age of Onset  . Hypertension Mother   . Diabetes Maternal Grandmother   . Stroke Maternal Grandmother   . Other Neg Hx     no family history of colon cancer  . Stomach cancer Neg Hx   . Esophageal cancer Neg Hx     . Prostate cancer Neg Hx   . Colon cancer Paternal Grandfather     No Known Allergies  Current Outpatient Prescriptions on File Prior to Visit  Medication Sig Dispense Refill  . labetalol (NORMODYNE) 300 MG tablet TAKE 1 TABLET (300 MG TOTAL) BY MOUTH 2 (TWO) TIMES DAILY. 180 tablet 0  . omeprazole (PRILOSEC) 20 MG capsule Take 1 capsule by mouth daily.  10   No current facility-administered medications on file prior to visit.    BP 136/96 mmHg  Pulse 83  Temp(Src) 98 F (36.7 C) (Oral)  Ht 5\' 9"  (1.753 m)  Wt 221 lb (100.245 kg)  BMI 32.62 kg/m2    Objective:   Physical Exam  Constitutional: He is oriented to person, place, and time. He appears well-developed and well-nourished. No distress.  Cardiovascular: Normal rate, regular rhythm and normal heart sounds.   No murmur heard. Pulmonary/Chest: Effort normal and breath sounds normal. He has no wheezes.  Musculoskeletal: He exhibits no edema.  Neurological: He is alert and oriented to person, place, and time.  Skin: Skin is warm and dry.  Dry, cracked hands.  Pain near hypothenar eminence  Psychiatric: He has a normal mood and affect. His behavior is normal.          Assessment & Plan:

## 2014-04-23 NOTE — Progress Notes (Signed)
Pre visit review using our clinic review tool, if applicable. No additional management support is needed unless otherwise documented below in the visit note. 

## 2014-04-23 NOTE — Assessment & Plan Note (Signed)
BP still not at goal.  Increase diovan/hctz to 320/25 mg.  Continue same dose of other antihypertensives. He could not tolerate amlodipine in the past. Monitor electrolytes and kidney function.  BP: (!) 136/96 mmHg

## 2014-06-23 ENCOUNTER — Other Ambulatory Visit: Payer: BLUE CROSS/BLUE SHIELD

## 2014-06-23 ENCOUNTER — Other Ambulatory Visit (INDEPENDENT_AMBULATORY_CARE_PROVIDER_SITE_OTHER): Payer: BLUE CROSS/BLUE SHIELD

## 2014-06-23 DIAGNOSIS — I10 Essential (primary) hypertension: Secondary | ICD-10-CM | POA: Diagnosis not present

## 2014-06-23 LAB — BASIC METABOLIC PANEL
BUN: 10 mg/dL (ref 6–23)
CALCIUM: 9.5 mg/dL (ref 8.4–10.5)
CO2: 29 meq/L (ref 19–32)
CREATININE: 1.07 mg/dL (ref 0.40–1.50)
Chloride: 106 mEq/L (ref 96–112)
GFR: 96.05 mL/min (ref >60.00)
Glucose, Bld: 76 mg/dL (ref 70–99)
POTASSIUM: 3.7 meq/L (ref 3.5–5.1)
Sodium: 140 mEq/L (ref 135–145)

## 2014-06-27 ENCOUNTER — Ambulatory Visit: Payer: BLUE CROSS/BLUE SHIELD | Admitting: Internal Medicine

## 2014-06-27 ENCOUNTER — Ambulatory Visit (INDEPENDENT_AMBULATORY_CARE_PROVIDER_SITE_OTHER): Payer: BLUE CROSS/BLUE SHIELD | Admitting: Adult Health

## 2014-06-27 ENCOUNTER — Encounter: Payer: Self-pay | Admitting: Adult Health

## 2014-06-27 VITALS — BP 120/86 | Temp 98.7°F | Wt 214.6 lb

## 2014-06-27 DIAGNOSIS — Z720 Tobacco use: Secondary | ICD-10-CM | POA: Diagnosis not present

## 2014-06-27 DIAGNOSIS — I10 Essential (primary) hypertension: Secondary | ICD-10-CM

## 2014-06-27 DIAGNOSIS — F17200 Nicotine dependence, unspecified, uncomplicated: Secondary | ICD-10-CM

## 2014-06-27 MED ORDER — VARENICLINE TARTRATE 0.5 MG X 11 & 1 MG X 42 PO MISC: ORAL | Status: DC

## 2014-06-27 MED ORDER — VARENICLINE TARTRATE 1 MG PO TABS: 1.0000 mg | ORAL_TABLET | Freq: Two times a day (BID) | ORAL | Status: DC

## 2014-06-27 NOTE — Progress Notes (Signed)
Pre visit review using our clinic review tool, if applicable. No additional management support is needed unless otherwise documented below in the visit note. 

## 2014-06-27 NOTE — Patient Instructions (Signed)
It was great meeting you today. Congratulations on the 7 pound weight loss! Work on exercising more and eating a healthy diet that is low in sodium and processed foods. You have a prescription for Chantix, good luck with quitting smoking! You can do it forever this time. If you need anything, please let us know. Dr. Shawna Orleans will see you in three months.

## 2014-06-27 NOTE — Assessment & Plan Note (Signed)
1. Essential hypertension - Blood pressure at goal.  - Continue with current medication regimen.  - Reviewed labs with patient. All labs continue to be within normal limits.  - Basic metabolic panel; Future  BP: 120/86 mmHg

## 2014-06-27 NOTE — Addendum Note (Signed)
Addended by: Apolinar Junes on: 06/27/2014 03:20 PM   Modules accepted: Miquel Dunn

## 2014-06-27 NOTE — Progress Notes (Addendum)
Subjective:    Patient ID: Scott Carson, male    DOB: 13-Aug-1970, 44 y.o.   MRN: [id number]  HPI Scott Carson presents to the office today for three month follow regarding his HTN and recent medication change. Today he is at goal of Blood pressure 120/86. He is having no complaints of high or low blood pressures, his only complaint is that he is urinating a lot.   He is not exercising and his diet continues to be poor.   He has lost 7 pounds since last visit and is currently at 214.   He has started smoking again and would like to try Chantix again.   Denies SOB, CP, edema, N/V/D  Review of Systems  Genitourinary: Positive for frequency.  All other systems reviewed and are negative.  Past Medical History  Diagnosis Date  . Hypertension   . Smoker   . Low back pain   . Eczema of hand   . Arthritis     BACK    History   Social History  . Marital Status: Married    Spouse Name: N/A  . Number of Children: N/A  . Years of Education: N/A   Occupational History  . Not on file.   Social History Main Topics  . Smoking status: Current Some Day Smoker -- 0.50 packs/day    Types: Cigarettes    Start date: 04/30/2012    Last Attempt to Quit: 06/21/2013  . Smokeless tobacco: Never Used  . Alcohol Use: 0.0 oz/week    0 Standard drinks or equivalent per week     Comment: occasionaly  . Drug Use: Yes    Special: Marijuana     Comment: Quit smoking & marijuana 1 month ago  . Sexual Activity: Not on file   Other Topics Concern  . Not on file   Social History Narrative   Married   Alcohol use-no      Current Smoker   Occupation: Architect            Past Surgical History  Procedure Laterality Date  . Tonsillectomy    . Upper gastrointestinal endoscopy      Family History  Problem Relation Age of Onset  . Hypertension Mother   . Diabetes Maternal Grandmother   . Stroke Maternal Grandmother   . Other Neg Hx     no family history of colon cancer  . Stomach  cancer Neg Hx   . Esophageal cancer Neg Hx   . Prostate cancer Neg Hx   . Colon cancer Paternal Grandfather     No Known Allergies  Current Outpatient Prescriptions on File Prior to Visit  Medication Sig Dispense Refill  . labetalol (NORMODYNE) 300 MG tablet TAKE 1 TABLET (300 MG TOTAL) BY MOUTH 2 (TWO) TIMES DAILY. 180 tablet 0  . omeprazole (PRILOSEC) 20 MG capsule Take 1 capsule by mouth daily.  10  . spironolactone (ALDACTONE) 25 MG tablet Take 1 tablet (25 mg total) by mouth daily. 90 tablet 1  . valsartan-hydrochlorothiazide (DIOVAN-HCT) 320-25 MG per tablet Take 1 tablet by mouth daily. 90 tablet 1   No current facility-administered medications on file prior to visit.    BP 120/86 mmHg  Temp(Src) 98.7 F (37.1 C) (Oral)  Wt 214 lb 9.6 oz (97.342 kg)        Objective:   Physical Exam  Constitutional: He is oriented to person, place, and time. He appears well-developed and well-nourished. No distress.  Cardiovascular: Normal rate, regular rhythm,  normal heart sounds and intact distal pulses.  Exam reveals no gallop and no friction rub.   No murmur heard. Pulmonary/Chest: Effort normal and breath sounds normal. No respiratory distress. He has no wheezes. He exhibits no tenderness.  Musculoskeletal: Normal range of motion. He exhibits no edema or tenderness.  Neurological: He is alert and oriented to person, place, and time.  Skin: Skin is warm and dry. No rash noted. He is not diaphoretic. No erythema. No pallor.  Psychiatric: He has a normal mood and affect. His behavior is normal. Judgment and thought content normal.  Nursing note and vitals reviewed.       Assessment & Plan:

## 2014-07-11 ENCOUNTER — Other Ambulatory Visit: Payer: Self-pay | Admitting: Internal Medicine

## 2014-08-31 ENCOUNTER — Encounter (HOSPITAL_BASED_OUTPATIENT_CLINIC_OR_DEPARTMENT_OTHER): Payer: Self-pay

## 2014-08-31 ENCOUNTER — Emergency Department (HOSPITAL_BASED_OUTPATIENT_CLINIC_OR_DEPARTMENT_OTHER)
Admission: EM | Admit: 2014-08-31 | Discharge: 2014-08-31 | Disposition: A | Discharge status: Home / Self Care | Payer: BLUE CROSS/BLUE SHIELD | Attending: Emergency Medicine | Admitting: Emergency Medicine

## 2014-08-31 DIAGNOSIS — B0239 Other herpes zoster eye disease: Secondary | ICD-10-CM | POA: Diagnosis not present

## 2014-08-31 DIAGNOSIS — I10 Essential (primary) hypertension: Secondary | ICD-10-CM | POA: Insufficient documentation

## 2014-08-31 DIAGNOSIS — Z872 Personal history of diseases of the skin and subcutaneous tissue: Secondary | ICD-10-CM | POA: Diagnosis not present

## 2014-08-31 DIAGNOSIS — Z79899 Other long term (current) drug therapy: Secondary | ICD-10-CM | POA: Diagnosis not present

## 2014-08-31 DIAGNOSIS — Z8739 Personal history of other diseases of the musculoskeletal system and connective tissue: Secondary | ICD-10-CM | POA: Diagnosis not present

## 2014-08-31 DIAGNOSIS — Z720 Tobacco use: Secondary | ICD-10-CM | POA: Diagnosis not present

## 2014-08-31 DIAGNOSIS — R21 Rash and other nonspecific skin eruption: Secondary | ICD-10-CM | POA: Diagnosis present

## 2014-08-31 MED ORDER — VALACYCLOVIR HCL 1 G PO TABS: 1000.0000 mg | ORAL_TABLET | Freq: Three times a day (TID) | ORAL | Status: DC

## 2014-08-31 MED ORDER — TETRACAINE HCL 0.5 % OP SOLN
2.0000 [drp] | Freq: Once | OPHTHALMIC | Status: AC
  Administered 2014-08-31: 2 [drp] via OPHTHALMIC
  Filled 2014-08-31: qty 2

## 2014-08-31 MED ORDER — PREDNISONE 20 MG PO TABS: 60.0000 mg | ORAL_TABLET | Freq: Every day | ORAL | Status: DC

## 2014-08-31 MED ORDER — FLUORESCEIN SODIUM 1 MG OP STRP
1.0000 | ORAL_STRIP | Freq: Once | OPHTHALMIC | Status: AC
  Administered 2014-08-31: 1 via OPHTHALMIC
  Filled 2014-08-31: qty 1

## 2014-08-31 MED ORDER — PREDNISONE 50 MG PO TABS
60.0000 mg | ORAL_TABLET | Freq: Once | ORAL | Status: AC
  Administered 2014-08-31: 60 mg via ORAL
  Filled 2014-08-31 (×2): qty 1

## 2014-08-31 MED ORDER — VALACYCLOVIR HCL 500 MG PO TABS
1000.0000 mg | ORAL_TABLET | Freq: Once | ORAL | Status: AC
  Administered 2014-08-31: 1000 mg via ORAL
  Filled 2014-08-31: qty 2

## 2014-08-31 NOTE — ED Provider Notes (Signed)
CSN: [id number]     Arrival date & time 08/31/14  1118 History   First MD Initiated Contact with Patient 08/31/14 1209     Chief Complaint  Patient presents with  . Rash     (Consider location/radiation/quality/duration/timing/severity/associated sxs/prior Treatment) HPI   Blood pressure 132/87, pulse 75, temperature 98.4 F (36.9 C), temperature source Oral, resp. rate 20, height 5\' 9"  (1.753 m), SpO2 100 %.  Scott Carson is a 44 y.o. male complaining of a minus, non-pruritic rash to left 4 head onset 2 days ago initially 2 lesions clustered, extending down to the left eye over the last day. Patient denies change in his vision, eye discharge, fever, chills, sick contacts, otalgia. He states that he has a headache in the area of the rash which he rates at 5 out of 10, not relieved with BC powder or Tylenol, headache started one day before the lesions were noticed. He does not remember having chickenpox as a child. Patient normally wears glasses but did not bring them to the ED.  Past Medical History  Diagnosis Date  . Hypertension   . Smoker   . Low back pain   . Eczema of hand   . Arthritis     BACK   Past Surgical History  Procedure Laterality Date  . Tonsillectomy    . Upper gastrointestinal endoscopy     Family History  Problem Relation Age of Onset  . Hypertension Mother   . Diabetes Maternal Grandmother   . Stroke Maternal Grandmother   . Other Neg Hx     no family history of colon cancer  . Stomach cancer Neg Hx   . Esophageal cancer Neg Hx   . Prostate cancer Neg Hx   . Colon cancer Paternal Grandfather    History  Substance Use Topics  . Smoking status: Current Some Day Smoker -- 0.50 packs/day    Types: Cigars    Start date: 04/30/2012    Last Attempt to Quit: 06/21/2013  . Smokeless tobacco: Never Used  . Alcohol Use: 0.0 oz/week    0 Standard drinks or equivalent per week     Comment: occasionaly    Review of Systems  10 systems reviewed and  found to be negative, except as noted in the HPI.   Allergies  Review of patient's allergies indicates no known allergies.  Home Medications   Prior to Admission medications   Medication Sig Start Date End Date Taking? Authorizing Provider  labetalol (NORMODYNE) 300 MG tablet TAKE 1 TABLET BY MOUTH TWICE A DAY 07/11/14   Doe-Hyun R Shawna Orleans, DO  omeprazole (PRILOSEC) 20 MG capsule Take 1 capsule by mouth daily. 11/30/13   Historical Provider, MD  predniSONE (DELTASONE) 20 MG tablet Take 3 tablets (60 mg total) by mouth daily. Take 60 mg by mouth daily week 1, then 40mg  by mouth daily for week 2, then 20mg  daily for week 3 08/31/14   Elmyra Ricks Robertt Buda, PA-C  spironolactone (ALDACTONE) 25 MG tablet Take 1 tablet (25 mg total) by mouth daily. 04/23/14   Doe-Hyun R Shawna Orleans, DO  valACYclovir (VALTREX) 1000 MG tablet Take 1 tablet (1,000 mg total) by mouth 3 (three) times daily. 08/31/14   Eriel Dunckel, PA-C  valsartan-hydrochlorothiazide (DIOVAN-HCT) 320-25 MG per tablet Take 1 tablet by mouth daily. 04/23/14   Doe-Hyun Kyra Searles, DO  varenicline (CHANTIX CONTINUING MONTH PAK) 1 MG tablet Take 1 tablet (1 mg total) by mouth 2 (two) times daily. 06/27/14   Dorothyann Peng, NP  varenicline (CHANTIX STARTING MONTH PAK) 0.5 MG X 11 & 1 MG X 42 tablet Take one 0.5 mg tablet by mouth once daily for 3 days, then increase to one 0.5 mg tablet twice daily for 4 days, then increase to one 1 mg tablet twice daily. 06/27/14   Dorothyann Peng, NP   BP 134/99 mmHg  Pulse 78  Temp(Src) 98.4 F (36.9 C) (Oral)  Resp 18  Ht 5\' 9"  (1.753 m)  SpO2 100% Physical Exam  Constitutional: He is oriented to person, place, and time. He appears well-developed and well-nourished. No distress.  HENT:  Head: Normocephalic.    Bilateral tympanic membranes with normal architecture, outer ear canal normal,  No nasal lesions  Eyes: Conjunctivae and EOM are normal. Left conjunctiva is not injected. Left conjunctiva has no hemorrhage. Left eye  exhibits no nystagmus. Left pupil is round and reactive.  Slit lamp exam:      The left eye shows no fluorescein uptake.  No dendrites or fluorescein uptake  Cardiovascular: Normal rate.   Pulmonary/Chest: Effort normal and breath sounds normal. No stridor.  Musculoskeletal: Normal range of motion.  Neurological: He is alert and oriented to person, place, and time.  Skin: Rash noted.  Clustered vesicular lesions scattered over left forehead extending down towards the left upper eyelid. Left eye with no injection, discharge, erythema. Pupils are equal round reactive to light. Extraocular movement is intact.  Psychiatric: He has a normal mood and affect.  Nursing note and vitals reviewed.   ED Course  Procedures (including critical care time) Labs Review Labs Reviewed - No data to display  Imaging Review No results found.   EKG Interpretation None      MDM   Final diagnoses:  Herpes zoster dermatitis of eyelid    Filed Vitals:   08/31/14 1121 08/31/14 1315  BP: 132/87 134/99  Pulse: 75 78  Temp: 98.4 F (36.9 C)   TempSrc: Oral   Resp: 20 18  Height: 5\' 9"  (1.753 m)   SpO2: 100% 100%    Medications  fluorescein ophthalmic strip 1 strip (1 strip Left Eye Given 08/31/14 1232)  valACYclovir (VALTREX) tablet 1,000 mg (1,000 mg Oral Given 08/31/14 1232)  predniSONE (DELTASONE) tablet 60 mg (60 mg Oral Given 08/31/14 1232)  tetracaine (PONTOCAINE) 0.5 % ophthalmic solution 2 drop (2 drops Left Eye Given 08/31/14 1256)    Scott Carson is a pleasant 44 y.o. male presenting with painless, non-pruritic vesicular rash to left forehead extending down towards the left eye. Patient has a prodrome of headache in the area. Clinically appears similar zoster. Will sustain eye check visual acuity. Patient started on valacyclovir and prednisone.  Evaluation does not show pathology that would require ongoing emergent intervention or inpatient treatment. Pt is hemodynamically stable and  mentating appropriately. Discussed findings and plan with patient/guardian, who agrees with care plan. All questions answered. Return precautions discussed and outpatient follow up given.   New Prescriptions   PREDNISONE (DELTASONE) 20 MG TABLET    Take 3 tablets (60 mg total) by mouth daily. Take 60 mg by mouth daily week 1, then 40mg  by mouth daily for week 2, then 20mg  daily for week 3   VALACYCLOVIR (VALTREX) 1000 MG TABLET    Take 1 tablet (1,000 mg total) by mouth 3 (three) times daily.         Monico Blitz, PA-C 08/31/14 Christine, PA-C 08/31/14 1323  Davonna Belling, MD 08/31/14 [phone]

## 2014-08-31 NOTE — Discharge Instructions (Signed)
Follow with the eye doctor in the next 24-48 hours.  Please follow with your primary care doctor in the next 2 days for a check-up. They must obtain records for further management.   Do not hesitate to return to the Emergency Department for any new, worsening or concerning symptoms.    Shingles Shingles (herpes zoster) is an infection that is caused by the same virus that causes chickenpox (varicella). The infection causes a painful skin rash and fluid-filled blisters, which eventually break open, crust over, and heal. It may occur in any area of the body, but it usually affects only one side of the body or face. The pain of shingles usually lasts about 1 month. However, some people with shingles may develop long-term (chronic) pain in the affected area of the body. Shingles often occurs many years after the person had chickenpox. It is more common:  In people older than 50 years.  In people with weakened immune systems, such as those with HIV, AIDS, or cancer.  In people taking medicines that weaken the immune system, such as transplant medicines.  In people under great stress. CAUSES  Shingles is caused by the varicella zoster virus (VZV), which also causes chickenpox. After a person is infected with the virus, it can remain in the person's body for years in an inactive state (dormant). To cause shingles, the virus reactivates and breaks out as an infection in a nerve root. The virus can be spread from person to person (contagious) through contact with open blisters of the shingles rash. It will only spread to people who have not had chickenpox. When these people are exposed to the virus, they may develop chickenpox. They will not develop shingles. Once the blisters scab over, the person is no longer contagious and cannot spread the virus to others. SIGNS AND SYMPTOMS  Shingles shows up in stages. The initial symptoms may be pain, itching, and tingling in an area of the skin. This pain is  usually described as burning, stabbing, or throbbing.In a few days or weeks, a painful red rash will appear in the area where the pain, itching, and tingling were felt. The rash is usually on one side of the body in a band or belt-like pattern. Then, the rash usually turns into fluid-filled blisters. They will scab over and dry up in approximately 2-3 weeks. Flu-like symptoms may also occur with the initial symptoms, the rash, or the blisters. These may include:  Fever.  Chills.  Headache.  Upset stomach. DIAGNOSIS  Your health care provider will perform a skin exam to diagnose shingles. Skin scrapings or fluid samples may also be taken from the blisters. This sample will be examined under a microscope or sent to a lab for further testing. TREATMENT  There is no specific cure for shingles. Your health care provider will likely prescribe medicines to help you manage the pain, recover faster, and avoid long-term problems. This may include antiviral drugs, anti-inflammatory drugs, and pain medicines. HOME CARE INSTRUCTIONS   Take a cool bath or apply cool compresses to the area of the rash or blisters as directed. This may help with the pain and itching.   Take medicines only as directed by your health care provider.   Rest as directed by your health care provider.  Keep your rash and blisters clean with mild soap and cool water or as directed by your health care provider.  Do not pick your blisters or scratch your rash. Apply an anti-itch cream or numbing  creams to the affected area as directed by your health care provider.  Keep your shingles rash covered with a loose bandage (dressing).  Avoid skin contact with:  Babies.   Pregnant women.   Children with eczema.   Elderly people with transplants.   People with chronic illnesses, such as leukemia or AIDS.   Wear loose-fitting clothing to help ease the pain of material rubbing against the rash.  Keep all follow-up  visits as directed by your health care provider.If the area involved is on your face, you may receive a referral for a specialist, such as an eye doctor (ophthalmologist) or an ear, nose, and throat (ENT) doctor. Keeping all follow-up visits will help you avoid eye problems, chronic pain, or disability.  SEEK IMMEDIATE MEDICAL CARE IF:   You have facial pain, pain around the eye area, or loss of feeling on one side of your face.  You have ear pain or ringing in your ear.  You have loss of taste.  Your pain is not relieved with prescribed medicines.   Your redness or swelling spreads.   You have more pain and swelling.  Your condition is worsening or has changed.   You have a fever. MAKE SURE YOU:  Understand these instructions.  Will watch your condition.  Will get help right away if you are not doing well or get worse. Document Released: 02/07/2005 Document Revised: 06/24/2013 Document Reviewed: 09/22/2011 [facility] Patient Information 2015 [place], [place]. This information is not intended to replace advice given to you by your health care provider. Make sure you discuss any questions you have with your health care provider.

## 2014-08-31 NOTE — ED Notes (Signed)
Pt also c/o left side headache since Thursday off and on, treated with BC powder and tylenol at home.

## 2014-08-31 NOTE — ED Notes (Signed)
Pt reports 2 days of rash on L forehead, worsening and now extending to eye, nonpruritic and not painful.

## 2014-09-01 ENCOUNTER — Other Ambulatory Visit: Payer: Self-pay | Admitting: Gastroenterology

## 2014-09-01 ENCOUNTER — Telehealth: Payer: Self-pay | Admitting: Internal Medicine

## 2014-09-01 NOTE — Telephone Encounter (Signed)
FYI

## 2014-09-01 NOTE — Telephone Encounter (Signed)
Patient is aware 

## 2014-09-01 NOTE — Telephone Encounter (Signed)
Noted.  I suggest follow up if he has persistent rash and/or pain

## 2014-09-01 NOTE — Telephone Encounter (Signed)
Pt call to say that he has shingles he went to [facility] for treatment

## 2014-09-08 ENCOUNTER — Other Ambulatory Visit: Payer: Self-pay | Admitting: Gastroenterology

## 2014-09-26 ENCOUNTER — Encounter: Payer: Self-pay | Admitting: Adult Health

## 2014-09-26 ENCOUNTER — Ambulatory Visit (INDEPENDENT_AMBULATORY_CARE_PROVIDER_SITE_OTHER): Payer: BLUE CROSS/BLUE SHIELD | Admitting: Adult Health

## 2014-09-26 VITALS — BP 114/80 | Temp 98.0°F | Ht 69.0 in | Wt 208.9 lb

## 2014-09-26 DIAGNOSIS — Z720 Tobacco use: Secondary | ICD-10-CM | POA: Diagnosis not present

## 2014-09-26 DIAGNOSIS — F17200 Nicotine dependence, unspecified, uncomplicated: Secondary | ICD-10-CM

## 2014-09-26 DIAGNOSIS — I10 Essential (primary) hypertension: Secondary | ICD-10-CM

## 2014-09-26 NOTE — Patient Instructions (Signed)
It was great seeing you today!  Nicotine gum in place of cigar smoking  Cut back on fried/fast food.   Start exercising  I want you to come off some of these blood pressure medications, with a few life style changes we can do that!  Follow up in three months for physical

## 2014-09-26 NOTE — Progress Notes (Signed)
   Subjective:    Patient ID: Scott Carson, male    DOB: [birth year], 44 y.o.   MRN: [id number]  HPI  Scott Carson is a 44 year old very pleasant man who is here for 3 month follow up for hypertension and smoking cessation we started him on Chantix during the last visit. He was able to quit smoking cigarettes but is now smoking cigars (5 a day), he feels like the Chantix does not help with cigar smoking.   He is eating less and drinking more water. When he does eat it is usually fast food or fried foods.   He has lost another 6 pounds.   Wt Readings from Last 3 Encounters:  09/26/14 208 lb 14.4 oz (94.756 kg)  06/27/14 214 lb 9.6 oz (97.342 kg)  04/23/14 221 lb (100.245 kg)   His last BP in office was 114/80 and this continues to improve as well. He is going to start exercising   Review of Systems  Constitutional: Negative.   Respiratory: Negative.   Cardiovascular: Negative.   Gastrointestinal: Negative.   Musculoskeletal: Negative.   Neurological: Negative.   All other systems reviewed and are negative.      Objective:   Physical Exam  Constitutional: He is oriented to person, place, and time. He appears well-developed and well-nourished. No distress.  Cardiovascular: Normal rate, regular rhythm, normal heart sounds and intact distal pulses.  Exam reveals no gallop and no friction rub.   No murmur heard. Pulmonary/Chest: Effort normal and breath sounds normal. No respiratory distress. He has no wheezes. He has no rales. He exhibits no tenderness.  Abdominal: Bowel sounds are normal. He exhibits no distension and no mass. There is no tenderness. There is no rebound and no guarding.  Neurological: He is alert and oriented to person, place, and time.  Skin: Skin is warm and dry. He is not diaphoretic.  Psychiatric: He has a normal mood and affect. His behavior is normal. Judgment and thought content normal.  Nursing note and vitals reviewed.     Assessment & Plan:  1. Essential  hypertension - At goal currently.  - Has been improving with weight loss.  - Consider reducing medications at next visit.   2. Tobacco use disorder - Advised to supplement nicotine gum for cigar - Start cutting back on number of cigars.   3. Health Maintenance - Cut back on fast food/fried food - Start exercising with your wife - stop smoking

## 2014-09-26 NOTE — Progress Notes (Signed)
Pre visit review using our clinic review tool, if applicable. No additional management support is needed unless otherwise documented below in the visit note. 

## 2014-10-29 ENCOUNTER — Other Ambulatory Visit: Payer: Self-pay | Admitting: Internal Medicine

## 2014-11-10 ENCOUNTER — Other Ambulatory Visit: Payer: Self-pay | Admitting: Internal Medicine

## 2014-12-05 ENCOUNTER — Ambulatory Visit: Payer: BLUE CROSS/BLUE SHIELD

## 2014-12-19 ENCOUNTER — Other Ambulatory Visit (INDEPENDENT_AMBULATORY_CARE_PROVIDER_SITE_OTHER): Payer: BLUE CROSS/BLUE SHIELD

## 2014-12-19 DIAGNOSIS — Z0000 Encounter for general adult medical examination without abnormal findings: Secondary | ICD-10-CM

## 2014-12-22 ENCOUNTER — Other Ambulatory Visit: Payer: BLUE CROSS/BLUE SHIELD

## 2014-12-23 ENCOUNTER — Other Ambulatory Visit: Payer: BLUE CROSS/BLUE SHIELD

## 2014-12-26 ENCOUNTER — Encounter: Payer: BLUE CROSS/BLUE SHIELD | Admitting: Adult Health

## 2015-01-01 ENCOUNTER — Ambulatory Visit (INDEPENDENT_AMBULATORY_CARE_PROVIDER_SITE_OTHER): Payer: BLUE CROSS/BLUE SHIELD | Admitting: Adult Health

## 2015-01-01 ENCOUNTER — Encounter: Payer: Self-pay | Admitting: Adult Health

## 2015-01-01 VITALS — BP 124/90 | HR 87 | Temp 97.7°F | Ht 69.0 in | Wt 210.8 lb

## 2015-01-01 DIAGNOSIS — Z23 Encounter for immunization: Secondary | ICD-10-CM

## 2015-01-01 DIAGNOSIS — Z0000 Encounter for general adult medical examination without abnormal findings: Secondary | ICD-10-CM | POA: Diagnosis not present

## 2015-01-01 DIAGNOSIS — F17200 Nicotine dependence, unspecified, uncomplicated: Secondary | ICD-10-CM

## 2015-01-01 DIAGNOSIS — F17210 Nicotine dependence, cigarettes, uncomplicated: Secondary | ICD-10-CM | POA: Diagnosis not present

## 2015-01-01 LAB — CBC WITH DIFFERENTIAL/PLATELET
BASOS PCT: 0.3 % (ref 0.0–3.0)
Basophils Absolute: 0 10*3/uL (ref 0.0–0.1)
EOS PCT: 1 % (ref 0.0–5.0)
Eosinophils Absolute: 0.1 10*3/uL (ref 0.0–0.7)
HCT: 45.9 % (ref 39.0–52.0)
Hemoglobin: 15.2 g/dL (ref 13.0–17.0)
Lymphocytes Relative: 47.5 % — ABNORMAL HIGH (ref 12.0–46.0)
Lymphs Abs: 3 10*3/uL (ref 0.7–4.0)
MCHC: 33.1 g/dL (ref 30.0–36.0)
MCV: 92 fl (ref 78.0–100.0)
MONOS PCT: 8.7 % (ref 3.0–12.0)
Monocytes Absolute: 0.5 10*3/uL (ref 0.1–1.0)
NEUTROS ABS: 2.7 10*3/uL (ref 1.4–7.7)
Neutrophils Relative %: 42.5 % — ABNORMAL LOW (ref 43.0–77.0)
PLATELETS: 262 10*3/uL (ref 150.0–400.0)
RBC: 4.99 Mil/uL (ref 4.22–5.81)
RDW: 13.6 % (ref 11.5–15.5)
WBC: 6.3 10*3/uL (ref 4.0–10.5)

## 2015-01-01 LAB — BASIC METABOLIC PANEL
BUN: 11 mg/dL (ref 6–23)
CHLORIDE: 103 meq/L (ref 96–112)
CO2: 29 meq/L (ref 19–32)
CREATININE: 1 mg/dL (ref 0.40–1.50)
Calcium: 9.9 mg/dL (ref 8.4–10.5)
GFR: 103.61 mL/min (ref >60.00)
Glucose, Bld: 99 mg/dL (ref 70–99)
POTASSIUM: 4.3 meq/L (ref 3.5–5.1)
SODIUM: 139 meq/L (ref 135–145)

## 2015-01-01 LAB — PSA: PSA: 1.53 ng/mL (ref 0.10–4.00)

## 2015-01-01 LAB — POCT URINALYSIS DIPSTICK
BILIRUBIN UA: NEGATIVE
GLUCOSE UA: NEGATIVE
Ketones, UA: NEGATIVE
LEUKOCYTES UA: NEGATIVE
Nitrite, UA: NEGATIVE
Protein, UA: NEGATIVE
RBC UA: NEGATIVE
Spec Grav, UA: 1.025
Urobilinogen, UA: 1
pH, UA: 6.5

## 2015-01-01 LAB — HEPATIC FUNCTION PANEL
ALT: 18 U/L (ref 0–53)
AST: 31 U/L (ref 0–37)
Albumin: 4.4 g/dL (ref 3.5–5.2)
Alkaline Phosphatase: 57 U/L (ref 39–117)
Bilirubin, Direct: 0.2 mg/dL (ref 0.0–0.3)
TOTAL PROTEIN: 7 g/dL (ref 6.0–8.3)
Total Bilirubin: 1 mg/dL (ref 0.2–1.2)

## 2015-01-01 LAB — LIPID PANEL
CHOL/HDL RATIO: 3
Cholesterol: 141 mg/dL (ref 0–200)
HDL: 45.6 mg/dL (ref >39.00)
LDL Cholesterol: 85 mg/dL (ref 0–99)
NonHDL: 95.33
TRIGLYCERIDES: 50 mg/dL (ref 0.0–149.0)
VLDL: 10 mg/dL (ref 0.0–40.0)

## 2015-01-01 LAB — TSH: TSH: 0.88 u[IU]/mL (ref 0.35–4.50)

## 2015-01-01 LAB — HEMOGLOBIN A1C: Hgb A1c MFr Bld: 5.6 % (ref 4.6–6.5)

## 2015-01-01 LAB — MAGNESIUM: MAGNESIUM: 1.8 mg/dL (ref 1.5–2.5)

## 2015-01-01 MED ORDER — BUPROPION HCL ER (SR) 150 MG PO TB12: 150.0000 mg | ORAL_TABLET | Freq: Two times a day (BID) | ORAL | Status: DC

## 2015-01-01 NOTE — Progress Notes (Signed)
Pre visit review using our clinic review tool, if applicable. No additional management support is needed unless otherwise documented below in the visit note. 

## 2015-01-01 NOTE — Patient Instructions (Addendum)
It was great seeing you again  I have sent in a prescription for Wellbutrin. For the first three days, take one pill in the morning. After that take one pill in the morning and one at night.   Follow up with me in 3 months.  Continue to eat healthy and exercise.   If you need anything in th meantime, please let me know  Smoking Cessation, Tips for Success If you are ready to quit smoking, congratulations! You have chosen to help yourself be healthier. Cigarettes bring nicotine, tar, carbon monoxide, and other irritants into your body. Your lungs, heart, and blood vessels will be able to work better without these poisons. There are many different ways to quit smoking. Nicotine gum, nicotine patches, a nicotine inhaler, or nicotine nasal spray can help with physical craving. Hypnosis, support groups, and medicines help break the habit of smoking. WHAT THINGS CAN I DO TO MAKE QUITTING EASIER?  Here are some tips to help you quit for good:  Pick a date when you will quit smoking completely. Tell all of your friends and family about your plan to quit on that date.  Do not try to slowly cut down on the number of cigarettes you are smoking. Pick a quit date and quit smoking completely starting on that day.  Throw away all cigarettes.   Clean and remove all ashtrays from your home, work, and car.  On a card, write down your reasons for quitting. Carry the card with you and read it when you get the urge to smoke.  Cleanse your body of nicotine. Drink enough water and fluids to keep your urine clear or pale yellow. Do this after quitting to flush the nicotine from your body.  Learn to predict your moods. Do not let a bad situation be your excuse to have a cigarette. Some situations in your life might tempt you into wanting a cigarette.  Never have "just one" cigarette. It leads to wanting another and another. Remind yourself of your decision to quit.  Change habits associated with smoking. If  you smoked while driving or when feeling stressed, try other activities to replace smoking. Stand up when drinking your coffee. Brush your teeth after eating. Sit in a different chair when you read the paper. Avoid alcohol while trying to quit, and try to drink fewer caffeinated beverages. Alcohol and caffeine may urge you to smoke.  Avoid foods and drinks that can trigger a desire to smoke, such as sugary or spicy foods and alcohol.  Ask people who smoke not to smoke around you.  Have something planned to do right after eating or having a cup of coffee. For example, plan to take a walk or exercise.  Try a relaxation exercise to calm you down and decrease your stress. Remember, you may be tense and nervous for the first 2 weeks after you quit, but this will pass.  Find new activities to keep your hands busy. Play with a pen, coin, or rubber band. Doodle or draw things on paper.  Brush your teeth right after eating. This will help cut down on the craving for the taste of tobacco after meals. You can also try mouthwash.   Use oral substitutes in place of cigarettes. Try using lemon drops, carrots, cinnamon sticks, or chewing gum. Keep them handy so they are available when you have the urge to smoke.  When you have the urge to smoke, try deep breathing.  Designate your home as a nonsmoking area.  If you are a heavy smoker, ask your health care provider about a prescription for nicotine chewing gum. It can ease your withdrawal from nicotine.  Reward yourself. Set aside the cigarette money you save and buy yourself something nice.  Look for support from others. Join a support group or smoking cessation program. Ask someone at home or at work to help you with your plan to quit smoking.  Always ask yourself, "Do I need this cigarette or is this just a reflex?" Tell yourself, "Today, I choose not to smoke," or "I do not want to smoke." You are reminding yourself of your decision to quit.  Do not  replace cigarette smoking with electronic cigarettes (commonly called e-cigarettes). The safety of e-cigarettes is unknown, and some may contain harmful chemicals.  If you relapse, do not give up! Plan ahead and think about what you will do the next time you get the urge to smoke. HOW WILL I FEEL WHEN I QUIT SMOKING? You may have symptoms of withdrawal because your body is used to nicotine (the addictive substance in cigarettes). You may crave cigarettes, be irritable, feel very hungry, cough often, get headaches, or have difficulty concentrating. The withdrawal symptoms are only temporary. They are strongest when you first quit but will go away within 10-14 days. When withdrawal symptoms occur, stay in control. Think about your reasons for quitting. Remind yourself that these are signs that your body is healing and getting used to being without cigarettes. Remember that withdrawal symptoms are easier to treat than the major diseases that smoking can cause.  Even after the withdrawal is over, expect periodic urges to smoke. However, these cravings are generally short lived and will go away whether you smoke or not. Do not smoke! WHAT RESOURCES ARE AVAILABLE TO HELP ME QUIT SMOKING? Your health care provider can direct you to community resources or hospitals for support, which may include:  Group support.  Education.  Hypnosis.  Therapy.   This information is not intended to replace advice given to you by your health care provider. Make sure you discuss any questions you have with your health care provider.   Document Released: 11/06/2003 Document Revised: 02/28/2014 Document Reviewed: 07/26/2012 Elsevier Interactive Patient Education [employer].

## 2015-01-01 NOTE — Addendum Note (Signed)
Addended by: Apolinar Junes on: 01/01/2015 10:23 AM   Modules accepted: Miquel Dunn

## 2015-01-01 NOTE — Progress Notes (Addendum)
Subjective:    Patient ID: Scott Carson, male    DOB: 1971/01/28, 44 y.o.   MRN: [id number]  HPI Patient presents for yearly preventative medicine examination.  All immunizations and health maintenance protocols were reviewed with the patient and needed orders were placed.  Appropriate screening laboratory values were ordered for the patient including screening of hyperlipidemia, renal function and hepatic function. If indicated by BPH, a PSA was ordered.  Medication reconciliation,  past medical history, social history, problem list and allergies were reviewed in detail with the patient  Goals were established with regard to weight loss, exercise, and diet in compliance with medications  He has not quit smoking, has tried Chantix but felt that he was smoking more on Chantix. He continues to want to quit smoking. He has cut back on the amount of cigars he smokes. He may smoke 4 cigars a day. He knows that he cannot quit on his own.   Is seeing Dermatology who has prescribed Lamisil for a fungal infection on his hands and feet.    Review of Systems  Constitutional: Negative.   Eyes: Negative.   Respiratory: Negative.   Cardiovascular: Negative.   Endocrine: Negative.   Genitourinary: Negative.   Musculoskeletal: Negative.   Skin: Negative.   Allergic/Immunologic: Negative.   Neurological: Negative.   Hematological: Negative.   Psychiatric/Behavioral: Negative.    Past Medical History  Diagnosis Date  . Hypertension   . Smoker   . Low back pain   . Eczema of hand   . Arthritis     BACK    Social History   Social History  . Marital Status: Married    Spouse Name: N/A  . Number of Children: N/A  . Years of Education: N/A   Occupational History  . Not on file.   Social History Main Topics  . Smoking status: Current Some Day Smoker -- 0.50 packs/day    Types: Cigars    Start date: 04/30/2012    Last Attempt to Quit: 06/21/2013  . Smokeless tobacco: Never Used   . Alcohol Use: 0.0 oz/week    0 Standard drinks or equivalent per week     Comment: occasionaly  . Drug Use: Yes    Special: Marijuana     Comment: Quit smoking & marijuana 1 month ago  . Sexual Activity: Not on file   Other Topics Concern  . Not on file   Social History Narrative   Married   Alcohol use-no      Current Smoker   Occupation: Architect            Past Surgical History  Procedure Laterality Date  . Tonsillectomy    . Upper gastrointestinal endoscopy      Family History  Problem Relation Age of Onset  . Hypertension Mother   . Diabetes Maternal Grandmother   . Stroke Maternal Grandmother   . Other Neg Hx     no family history of colon cancer  . Stomach cancer Neg Hx   . Esophageal cancer Neg Hx   . Prostate cancer Neg Hx   . Colon cancer Paternal Grandfather     No Known Allergies  Current Outpatient Prescriptions on File Prior to Visit  Medication Sig Dispense Refill  . labetalol (NORMODYNE) 300 MG tablet TAKE 1 TABLET BY MOUTH TWICE A DAY 180 tablet 0  . omeprazole (PRILOSEC) 20 MG capsule TAKE 1 CAPSULE (20 MG TOTAL) BY MOUTH 2 (TWO) TIMES DAILY BEFORE A MEAL.  60 capsule 3  . predniSONE (DELTASONE) 20 MG tablet Take 3 tablets (60 mg total) by mouth daily. Take 60 mg by mouth daily week 1, then 40mg  by mouth daily for week 2, then 20mg  daily for week 3 42 tablet 0  . spironolactone (ALDACTONE) 25 MG tablet TAKE 1 TABLET BY MOUTH EVERY DAY 90 tablet 1  . terbinafine (LAMISIL) 250 MG tablet TAKE 1 TABLET BY ORAL ROUTE EVERY DAY  2  . valACYclovir (VALTREX) 1000 MG tablet Take 1 tablet (1,000 mg total) by mouth 3 (three) times daily. 30 tablet 0  . valsartan-hydrochlorothiazide (DIOVAN-HCT) 320-25 MG per tablet TAKE 1 TABLET BY MOUTH DAILY. 90 tablet 1   No current facility-administered medications on file prior to visit.    BP 124/90 mmHg  Pulse 87  Temp(Src) 97.7 F (36.5 C) (Oral)  Ht 5\' 9"  (1.753 m)  Wt 210 lb 12.8 oz (95.618 kg)  BMI  31.12 kg/m2       Objective:   Physical Exam  Constitutional: He is oriented to person, place, and time. He appears well-developed and well-nourished. No distress.  Slightly obese  HENT:  Head: Normocephalic and atraumatic.  Right Ear: External ear normal.  Left Ear: External ear normal.  Nose: Nose normal.  Mouth/Throat: Oropharynx is clear and moist. No oropharyngeal exudate.  Eyes: Conjunctivae and EOM are normal. Pupils are equal, round, and reactive to light. Right eye exhibits no discharge. Left eye exhibits no discharge. No scleral icterus.  Neck: Normal range of motion. Neck supple. No JVD present. No tracheal deviation present. No thyromegaly present.  Cardiovascular: Normal rate, regular rhythm, normal heart sounds and intact distal pulses.  Exam reveals no gallop and no friction rub.   No murmur heard. Pulmonary/Chest: Effort normal. No respiratory distress. He has wheezes (slight wheezes at bases). He has no rales. He exhibits no tenderness.  Abdominal: Soft. Bowel sounds are normal. He exhibits no distension and no mass. There is no tenderness. There is no rebound and no guarding.  Genitourinary:  Deferred  Musculoskeletal: Normal range of motion. He exhibits no edema or tenderness.  Lymphadenopathy:    He has no cervical adenopathy.  Neurological: He is alert and oriented to person, place, and time. He has normal reflexes. No cranial nerve deficit.  Skin: Skin is warm and dry. No rash noted. He is not diaphoretic. No erythema. No pallor.  Rough dry skin on hands and feet  Psychiatric: He has a normal mood and affect. His behavior is normal. Judgment and thought content normal.  Nursing note reviewed.      Assessment & Plan:  1. Routine general medical examination at a health care facility - Future lab orders in - Magnesium - Follow up in 3 months for smoking cessation follow up - Follow up in one year for CPE - Follow up sooner if needed 2. Tobacco use  disorder - buPROPion (WELLBUTRIN SR) 150 MG 12 hr tablet; Take 1 tablet (150 mg total) by mouth 2 (two) times daily.  Dispense: 60 tablet; Refill: 3 - Follow up in 3 months 3. Encounter for immunization - Flu Shot given

## 2015-02-12 ENCOUNTER — Other Ambulatory Visit: Payer: Self-pay | Admitting: Internal Medicine

## 2015-04-10 ENCOUNTER — Encounter: Payer: Self-pay | Admitting: Adult Health

## 2015-04-10 ENCOUNTER — Ambulatory Visit (INDEPENDENT_AMBULATORY_CARE_PROVIDER_SITE_OTHER): Payer: BLUE CROSS/BLUE SHIELD | Admitting: Adult Health

## 2015-04-10 ENCOUNTER — Ambulatory Visit: Payer: BLUE CROSS/BLUE SHIELD | Admitting: Adult Health

## 2015-04-10 VITALS — BP 108/78 | HR 77 | Temp 98.4°F | Ht 69.0 in | Wt 208.0 lb

## 2015-04-10 DIAGNOSIS — M792 Neuralgia and neuritis, unspecified: Secondary | ICD-10-CM | POA: Diagnosis not present

## 2015-04-10 DIAGNOSIS — F17200 Nicotine dependence, unspecified, uncomplicated: Secondary | ICD-10-CM | POA: Diagnosis not present

## 2015-04-10 DIAGNOSIS — F520 Hypoactive sexual desire disorder: Secondary | ICD-10-CM | POA: Diagnosis not present

## 2015-04-10 MED ORDER — METHYLPREDNISOLONE ACETATE 80 MG/ML IJ SUSP: 120.0000 mg | Freq: Once | INTRAMUSCULAR | Status: DC

## 2015-04-10 MED ORDER — CYCLOBENZAPRINE HCL 10 MG PO TABS: 10.0000 mg | ORAL_TABLET | Freq: Three times a day (TID) | ORAL | Status: DC | PRN

## 2015-04-10 NOTE — Addendum Note (Signed)
Addended by: Apolinar Junes on: 04/10/2015 11:46 AM   Modules accepted: Orders

## 2015-04-10 NOTE — Progress Notes (Signed)
Pre visit review using our clinic review tool, if applicable. No additional management support is needed unless otherwise documented below in the visit note. 

## 2015-04-10 NOTE — Patient Instructions (Addendum)
It was great seeing you again!  Continue to take Wellbutrin to help you quit smoking.   I will follow up with you regarding your blood work.   Use the Flexeril as needed to help with muscle spasms.   Cervical Radiculopathy Cervical radiculopathy happens when a nerve in the neck (cervical nerve) is pinched or bruised. This condition can develop because of an injury or as part of the normal aging process. Pressure on the cervical nerves can cause pain or numbness that runs from the neck all the way down into the arm and fingers. Usually, this condition gets better with rest. Treatment may be needed if the condition does not improve.  CAUSES This condition may be caused by:  Injury.  Slipped (herniated) disk.  Muscle tightness in the neck because of overuse.  Arthritis.  Breakdown or degeneration in the bones and joints of the spine (spondylosis) due to aging.  Bone spurs that may develop near the cervical nerves. SYMPTOMS Symptoms of this condition include:  Pain that runs from the neck to the arm and hand. The pain can be severe or irritating. It may be worse when the neck is moved.  Numbness or weakness in the affected arm and hand. DIAGNOSIS This condition may be diagnosed based on symptoms, medical history, and a physical exam. You may also have tests, including:  X-rays.  CT scan.  MRI.  Electromyogram (EMG).  Nerve conduction tests. TREATMENT In many cases, treatment is not needed for this condition. With rest, the condition usually gets better over time. If treatment is needed, options may include:  Wearing a soft neck collar for short periods of time.  Physical therapy to strengthen your neck muscles.  Medicines, such as NSAIDs, oral corticosteroids, or spinal injections.  Surgery. This may be needed if other treatments do not help. Various types of surgery may be done depending on the cause of your problems. HOME CARE INSTRUCTIONS Managing Pain  Take  over-the-counter and prescription medicines only as told by your health care provider.  If directed, apply ice to the affected area.  Put ice in a plastic bag.  Place a towel between your skin and the bag.  Leave the ice on for 20 minutes, 2-3 times per day.  If ice does not help, you can try using heat. Take a warm shower or warm bath, or use a heat pack as told by your health care provider.  Try a gentle neck and shoulder massage to help relieve symptoms. Activity  Rest as needed. Follow instructions from your health care provider about any restrictions on activities.  Do stretching and strengthening exercises as told by your health care provider or physical therapist. General Instructions  If you were given a soft collar, wear it as told by your health care provider.  Use a flat pillow when you sleep.  Keep all follow-up visits as told by your health care provider. This is important. SEEK MEDICAL CARE IF:  Your condition does not improve with treatment. SEEK IMMEDIATE MEDICAL CARE IF:  Your pain gets much worse and cannot be controlled with medicines.  You have weakness or numbness in your hand, arm, face, or leg.  You have a high fever.  You have a stiff, rigid neck.  You lose control of your bowels or your bladder (have incontinence).  You have trouble with walking, balance, or speaking.   This information is not intended to replace advice given to you by your health care provider. Make sure you  discuss any questions you have with your health care provider.   Document Released: 11/02/2000 Document Revised: 10/29/2014 Document Reviewed: 04/03/2014 Elsevier Interactive Patient Education [employer].

## 2015-04-10 NOTE — Progress Notes (Addendum)
Subjective:    Patient ID: Scott Carson, male    DOB: 11/16/1970, 45 y.o.   MRN: [id number]  HPI  45 year old male who presents to the office today for left shoulder pain x 3 months. He reports that he has pain when raising his arm up from his side, h eis only able to go half way without pain. He is able to raise arm above head vertically.   Denies any trauma to the area. He does report numbness and tingling down left arm that becomes worse when he moves his arm.  He also has complaint of decreased sexual desire. He reports that his wife wants to have sex every night and he " just can't keep up. I need a break every other night." He denies any issues with getting or maintaining an erection.   He still has not quit smoking and had quit taking Wellbutrin but would like to try again.    Review of Systems  Respiratory: Negative.   Cardiovascular: Negative.   Genitourinary: Negative.   Musculoskeletal: Positive for arthralgias. Negative for back pain, joint swelling, neck pain and neck stiffness.  Skin: Negative.   Neurological: Negative.   Psychiatric/Behavioral: Negative.   All other systems reviewed and are negative.  Past Medical History  Diagnosis Date  . Hypertension   . Smoker   . Low back pain   . Eczema of hand   . Arthritis     BACK    Social History   Social History  . Marital Status: Married    Spouse Name: N/A  . Number of Children: N/A  . Years of Education: N/A   Occupational History  . Not on file.   Social History Main Topics  . Smoking status: Current Some Day Smoker -- 0.50 packs/day    Types: Cigars    Start date: 04/30/2012    Last Attempt to Quit: 06/21/2013  . Smokeless tobacco: Never Used  . Alcohol Use: 0.0 oz/week    0 Standard drinks or equivalent per week     Comment: occasionaly  . Drug Use: Yes    Special: Marijuana     Comment: Quit smoking & marijuana 1 month ago  . Sexual Activity: Not on file   Other Topics Concern  . Not on  file   Social History Narrative   Married   Alcohol use-no      Current Smoker   Occupation: Architect            Past Surgical History  Procedure Laterality Date  . Tonsillectomy    . Upper gastrointestinal endoscopy      Family History  Problem Relation Age of Onset  . Hypertension Mother   . Diabetes Maternal Grandmother   . Stroke Maternal Grandmother   . Other Neg Hx     no family history of colon cancer  . Stomach cancer Neg Hx   . Esophageal cancer Neg Hx   . Prostate cancer Neg Hx   . Colon cancer Paternal Grandfather     No Known Allergies  Current Outpatient Prescriptions on File Prior to Visit  Medication Sig Dispense Refill  . buPROPion (WELLBUTRIN SR) 150 MG 12 hr tablet Take 1 tablet (150 mg total) by mouth 2 (two) times daily. 60 tablet 3  . labetalol (NORMODYNE) 300 MG tablet TAKE 1 TABLET BY MOUTH TWICE A DAY 180 tablet 1  . spironolactone (ALDACTONE) 25 MG tablet TAKE 1 TABLET BY MOUTH EVERY DAY 90 tablet  1  . valsartan-hydrochlorothiazide (DIOVAN-HCT) 320-25 MG per tablet TAKE 1 TABLET BY MOUTH DAILY. 90 tablet 1   No current facility-administered medications on file prior to visit.    BP 108/78 mmHg  Pulse 77  Temp(Src) 98.4 F (36.9 C) (Oral)  Ht 5\' 9"  (1.753 m)  Wt 208 lb (94.348 kg)  BMI 30.70 kg/m2  SpO2 99%       Objective:   Physical Exam  Constitutional: He is oriented to person, place, and time. He appears well-developed and well-nourished. No distress.  Cardiovascular: Normal rate, regular rhythm, normal heart sounds and intact distal pulses.  Exam reveals no gallop and no friction rub.   No murmur heard. Pulmonary/Chest: Effort normal and breath sounds normal. No respiratory distress. He has no wheezes. He has no rales. He exhibits no tenderness.  Musculoskeletal: He exhibits tenderness. He exhibits no edema.       Left shoulder: He exhibits decreased range of motion, tenderness and pain. He exhibits no bony tenderness, no  swelling, no effusion, no crepitus, no deformity, no spasm and normal strength.  Neurological: He is alert and oriented to person, place, and time. He has normal reflexes. He displays normal reflexes. No cranial nerve deficit. He exhibits normal muscle tone. Coordination normal.  Skin: Skin is warm and dry. No rash noted. He is not diaphoretic. No erythema. No pallor.  Psychiatric: He has a normal mood and affect. His behavior is normal. Judgment and thought content normal.  Nursing note and vitals reviewed.      Assessment & Plan:  1. Decreased sexual desire - Testosterone, Free, Total, SHBG  2. Radicular pain in left arm After obtaining consent, a steroid injection was performed using 1% lidocaine and 120 mg of Depo Medrol, injection was given by Dorothyann Peng using lateral approach to left shoulder. Patient instructed to remain in clinic for 20 minutes afterwards, and to report any adverse reaction to me immediately. This procedure was tolerated well. He had good ROM and pain response 10 minutes after injection.  - cyclobenzaprine (FLEXERIL) 10 MG tablet; Take 1 tablet (10 mg total) by mouth 3 (three) times daily as needed for muscle spasms.  Dispense: 30 tablet; Refill: 0 - methylPREDNISolone acetate (DEPO-MEDROL) injection 120 mg; Inject 1.5 mLs (120 mg total) into the articular space once.  3. Tobacco use disorder - Restart Wellbutrin.  - Spent roughly 3-5 minutes of visit talking about smoking cessation

## 2015-04-13 LAB — TESTOSTERONE, FREE, TOTAL, SHBG
Sex Hormone Binding: 65 nmol/L — ABNORMAL HIGH (ref 10–50)
TESTOSTERONE FREE: 73.8 pg/mL (ref 47.0–244.0)
Testosterone-% Free: 1.3 % — ABNORMAL LOW (ref 1.6–2.9)
Testosterone: 555 ng/dL (ref 250–827)

## 2015-04-21 ENCOUNTER — Telehealth: Payer: Self-pay | Admitting: Adult Health

## 2015-04-21 DIAGNOSIS — M792 Neuralgia and neuritis, unspecified: Secondary | ICD-10-CM

## 2015-04-21 NOTE — Telephone Encounter (Signed)
Patient state the Cortizone Inj only worked for one day, he is still in a lot of pain. What do he need to do, please advise

## 2015-04-21 NOTE — Telephone Encounter (Signed)
Called and spoke with pt and pt is aware.  Referral for sports medicine entered.

## 2015-04-21 NOTE — Telephone Encounter (Signed)
We can get him over to sports medicine

## 2015-04-29 ENCOUNTER — Other Ambulatory Visit: Payer: Self-pay | Admitting: Internal Medicine

## 2015-05-04 MED ORDER — METHYLPREDNISOLONE ACETATE 80 MG/ML IJ SUSP
120.0000 mg | Freq: Once | INTRAMUSCULAR | Status: AC
  Administered 2015-04-10: 120 mg via INTRA_ARTICULAR

## 2015-05-04 NOTE — Addendum Note (Signed)
Addended by: Colleen Can on: 05/04/2015 09:54 AM   Modules accepted: Orders

## 2015-05-13 ENCOUNTER — Ambulatory Visit: Payer: BLUE CROSS/BLUE SHIELD | Admitting: Family Medicine

## 2015-05-14 ENCOUNTER — Other Ambulatory Visit: Payer: Self-pay | Admitting: Gastroenterology

## 2015-05-28 ENCOUNTER — Ambulatory Visit (INDEPENDENT_AMBULATORY_CARE_PROVIDER_SITE_OTHER)
Admission: RE | Admit: 2015-05-28 | Discharge: 2015-05-28 | Disposition: A | Discharge status: Home / Self Care | Payer: BLUE CROSS/BLUE SHIELD | Source: Ambulatory Visit | Attending: Family Medicine | Admitting: Family Medicine

## 2015-05-28 ENCOUNTER — Other Ambulatory Visit (INDEPENDENT_AMBULATORY_CARE_PROVIDER_SITE_OTHER): Payer: BLUE CROSS/BLUE SHIELD

## 2015-05-28 ENCOUNTER — Encounter: Payer: Self-pay | Admitting: Family Medicine

## 2015-05-28 ENCOUNTER — Ambulatory Visit (INDEPENDENT_AMBULATORY_CARE_PROVIDER_SITE_OTHER): Payer: BLUE CROSS/BLUE SHIELD | Admitting: Family Medicine

## 2015-05-28 VITALS — BP 128/82 | HR 90 | Ht 69.0 in | Wt 204.0 lb

## 2015-05-28 DIAGNOSIS — M25512 Pain in left shoulder: Secondary | ICD-10-CM

## 2015-05-28 DIAGNOSIS — M75102 Unspecified rotator cuff tear or rupture of left shoulder, not specified as traumatic: Secondary | ICD-10-CM | POA: Diagnosis not present

## 2015-05-28 MED ORDER — TIZANIDINE HCL 4 MG PO TABS: 4.0000 mg | ORAL_TABLET | Freq: Every evening | ORAL | Status: DC

## 2015-05-28 MED ORDER — MELOXICAM 15 MG PO TABS: 15.0000 mg | ORAL_TABLET | Freq: Every day | ORAL | Status: DC

## 2015-05-28 NOTE — Assessment & Plan Note (Signed)
Patient does continue to have a rotator cuff tear. Significant atrophy from previous exam and patient's retraction went from 0.75 cm to 1.5 cm. Mild underlying arthritis.discussed with patient about different treatment options. X-ray ordered today and if any high riding or mild arthritic changes showing I would consider MRI. If severe arthritis is noted then patient would not be a surgical candidate.  Prescription for anti-inflammatory muscle relaxer given today.patient does have an MRI and like to see him 1-2 days afterwards and at that point we will decide if any referral for surgical management is necessary or what other treatment options or available.

## 2015-05-28 NOTE — Patient Instructions (Signed)
Good to see you  meloxicam daily for 10 days then as needed Zanaflex at night  Get xray today and if not bad arthritis lets get you a MRI to see if we can fix it See me again 1-2 days after MRI.

## 2015-05-28 NOTE — Progress Notes (Signed)
[facility] [address] [place], [place] [address] Phone: [phone] Subjective:    CC: Left shoulder pain follow up  [id number] Scott Carson is a 45 y.o. male coming in with complaint of left shoulder pain and weakness. Patient was seen previously and did have a rotator cuff tear. Patient was last seen by me greater than a year and a half ago. Patient states that because her work he was unable to do anything and did not want to get the MRI that was ordered previously. Patient states since then in fortunate the pain has worsened and he is also noticing increasing weakness. Saw another provider who gave him an injection in February. States that it lasted for 3 days and then the pain came back. Was referred here for further evaluation again. Patient states that now even daily activities have become much more difficult. States that there is some weakness that is even hard to lift above his head. Waking him up at night. Severity 8 out of 10  Past Medical History  Diagnosis Date  . Hypertension   . Smoker   . Low back pain   . Eczema of hand   . Arthritis     BACK   Past Surgical History  Procedure Laterality Date  . Tonsillectomy    . Upper gastrointestinal endoscopy     Social History   Social History  . Marital Status: Married    Spouse Name: N/A  . Number of Children: N/A  . Years of Education: N/A   Occupational History  . Not on file.   Social History Main Topics  . Smoking status: Current Some Day Smoker -- 0.50 packs/day    Types: Cigars    Start date: 04/30/2012    Last Attempt to Quit: 06/21/2013  . Smokeless tobacco: Never Used  . Alcohol Use: 0.0 oz/week    0 Standard drinks or equivalent per week     Comment: occasionaly  . Drug Use: Yes    Special: Marijuana     Comment: Quit smoking & marijuana 1 month ago  . Sexual Activity: Not on file   Other Topics Concern  . Not on file   Social History Narrative   Married   Alcohol use-no      Current Smoker   Occupation: Architect           No Known Allergies Family History  Problem Relation Age of Onset  . Hypertension Mother   . Diabetes Maternal Grandmother   . Stroke Maternal Grandmother   . Other Neg Hx     no family history of colon cancer  . Stomach cancer Neg Hx   . Esophageal cancer Neg Hx   . Prostate cancer Neg Hx   . Colon cancer Paternal Grandfather      .    Past medical history, social, surgical and family history all reviewed in electronic medical record.   Review of Systems: No headache, visual changes, nausea, vomiting, diarrhea, constipation, dizziness, abdominal pain, skin rash, fevers, chills, night sweats, weight loss, swollen lymph nodes, body aches, joint swelling, muscle aches, chest pain, shortness of breath, mood changes.   Objective Blood pressure 128/82, pulse 90, height 5\' 9"  (1.753 m), weight 204 lb (92.534 kg), SpO2 97 %.  General: No apparent distress alert and oriented x3 mood and affect normal, dressed appropriately.  HEENT: Pupils equal, extraocular movements intact  Respiratory: Patient's speak in full sentences and does not appear short of  breath  Cardiovascular: No lower extremity edema, non tender, no erythema  Skin: Warm dry intact with no signs of infection or rash on extremities or on axial skeleton.  Abdomen: Soft nontender  Neuro: Cranial nerves II through XII are intact, neurovascularly intact in all extremities with 2+ DTRs and 2+ pulses.  Lymph: No lymphadenopathy of posterior or anterior cervical chain or axillae bilaterally.  Gait normal with good balance and coordination.  MSK:  Non tender with full range of motion and good stability and symmetric strength and tone of  elbows, wrist, hip, knee and ankles bilaterally.  Shoulder: left Inspection reveals mild atrophy as well as high riding left shoulder Palpation is normal with no tenderness over AC joint or bicipital groove. ROM is full in all  planes passively.does have some mild crepitus noted Rotator cuff strength shows 3 out of 5 strength compared to the contralateral side that has 5 out 5 strength Significant worsening from previous exam signs of impingement with positive Neer and Hawkin's tests, but negative empty can sign. Speeds and Yergason's tests normal. Positive labral pathology Normal scapular function observed. No painful arc and no drop arm sign. No apprehension sign Contralateral shoulder unremarkable  MSK US performed of: left This study was ordered, performed, and interpreted by Charlann Boxer D.O.  Shoulder:   Supraspinatus:  Patient does have what appears to be a full-thickness tear when comparing to previous imaging patient did have 0.75 cm tear and now 1.5 cm tear with significant atrophy. Mild underlying arthritic changes noted as well. Infraspinatus:  Appears normal on long and transverse views. Significant increase in Doppler flow Subscapularis:   Teres Minor:  Appears normal on long and transverse views. AC joint:  Capsule undistended, no geyser sign. Glenohumeral Joint:  Mild arthritic changes noted Glenoid Labrum:  Intact without visualized tears. Biceps Tendon:  Appears normal on long and transverse views, no fraying of tendon, tendon located in intertubercular groove, no subluxation with shoulder internal or external rotation.  Impression: Rotator cuff tear with significant retraction and underlying arthritis      Impression and Recommendations:     This case required medical decision making of moderate complexity.

## 2015-05-28 NOTE — Progress Notes (Signed)
Pre visit review using our clinic review tool, if applicable. No additional management support is needed unless otherwise documented below in the visit note. 

## 2015-05-29 ENCOUNTER — Telehealth: Payer: Self-pay | Admitting: Family Medicine

## 2015-05-29 NOTE — Telephone Encounter (Signed)
Completely agreed.  Would consider neck xrays as well then

## 2015-05-29 NOTE — Telephone Encounter (Signed)
Spoke with patient. States that after leaving appointment yesterday noticed that he was also having pain in his right shoulder/arm. He states that it is much worse today and that his arm feels weak with pain going down the back of his arm-not to elbow. Will pick up meds today and start taking. I told him we would be in contact if wanted to do anything different, otherwise to begin meds. He will try to set up MyChart for communication as well. Instructed him that if pain continues to increase to consider going to Urgent Care or ER.

## 2015-05-29 NOTE — Telephone Encounter (Signed)
States that seen Dr. Tamala Julian for left arm pain.  Patient states the pain has now moved to his right arm.  Please follow back up patient.

## 2015-07-14 ENCOUNTER — Encounter: Payer: Self-pay | Admitting: Adult Health

## 2015-07-14 ENCOUNTER — Other Ambulatory Visit: Payer: Self-pay | Admitting: Gastroenterology

## 2015-07-14 ENCOUNTER — Ambulatory Visit (INDEPENDENT_AMBULATORY_CARE_PROVIDER_SITE_OTHER): Payer: BLUE CROSS/BLUE SHIELD | Admitting: Adult Health

## 2015-07-14 VITALS — BP 140/98 | Temp 97.9°F | Wt 211.2 lb

## 2015-07-14 DIAGNOSIS — M25512 Pain in left shoulder: Secondary | ICD-10-CM | POA: Diagnosis not present

## 2015-07-14 NOTE — Progress Notes (Signed)
Subjective:    Patient ID: Scott Carson, male    DOB: 03-25-1970, 45 y.o.   MRN: [id number]  HPI 45 year old male who presents with continued left shoulder pain. He was evaluated by Dr. Tamala Julian on 05/28/2015 and per his note:  Patient does continue to have a rotator cuff tear. Significant atrophy from previous exam and patient's retraction went from 0.75 cm to 1.5 cm. Mild underlying arthritis.  Today patient reports that he never got a call for the MRI and that his pain is becoming worse. He is unable to hold anything in his left arm without significant pain. He denies any numbness or tingling in left upper extremity. No loss of sensation.   Review of Systems  Constitutional: Positive for activity change.  Respiratory: Negative.   Cardiovascular: Negative.   Musculoskeletal: Positive for arthralgias. Negative for joint swelling.  Skin: Negative.   Neurological: Negative.   All Scott systems reviewed and are negative.  Past Medical History  Diagnosis Date  . Hypertension   . Smoker   . Low back pain   . Eczema of hand   . Arthritis     BACK    Social History   Social History  . Marital Status: Married    Spouse Name: N/A  . Number of Children: N/A  . Years of Education: N/A   Occupational History  . Not on file.   Social History Main Topics  . Smoking status: Current Some Day Smoker -- 0.50 packs/day    Types: Cigars    Start date: 04/30/2012    Last Attempt to Quit: 06/21/2013  . Smokeless tobacco: Never Used  . Alcohol Use: 0.0 oz/week    0 Standard drinks or equivalent per week     Comment: occasionaly  . Drug Use: Yes    Special: Marijuana     Comment: Quit smoking & marijuana 1 month ago  . Sexual Activity: Not on file   Scott Topics Concern  . Not on file   Social History Narrative   Married   Alcohol use-no      Current Smoker   Occupation: Architect            Past Surgical History  Procedure Laterality Date  . Tonsillectomy    . Upper  gastrointestinal endoscopy      Family History  Problem Relation Age of Onset  . Hypertension Mother   . Diabetes Maternal Grandmother   . Stroke Maternal Grandmother   . Scott Neg Hx     no family history of colon cancer  . Stomach cancer Neg Hx   . Esophageal cancer Neg Hx   . Prostate cancer Neg Hx   . Colon cancer Paternal Grandfather     No Known Allergies  Current Outpatient Prescriptions on File Prior to Visit  Medication Sig Dispense Refill  . labetalol (NORMODYNE) 300 MG tablet TAKE 1 TABLET BY MOUTH TWICE A DAY 180 tablet 1  . spironolactone (ALDACTONE) 25 MG tablet TAKE 1 TABLET BY MOUTH EVERY DAY 90 tablet 1  . valsartan-hydrochlorothiazide (DIOVAN-HCT) 320-25 MG tablet TAKE 1 TABLET BY MOUTH DAILY. 90 tablet 1  . buPROPion (WELLBUTRIN SR) 150 MG 12 hr tablet Take 1 tablet (150 mg total) by mouth 2 (two) times daily. (Patient not taking: Reported on 07/14/2015) 60 tablet 3  . meloxicam (MOBIC) 15 MG tablet Take 1 tablet (15 mg total) by mouth daily. (Patient not taking: Reported on 07/14/2015) 30 tablet 0  . tiZANidine (  ZANAFLEX) 4 MG tablet Take 1 tablet (4 mg total) by mouth Nightly. (Patient not taking: Reported on 07/14/2015) 30 tablet 2  . triamcinolone cream (KENALOG) 0.1 % Reported on 07/14/2015  2   No current facility-administered medications on file prior to visit.    BP 140/98 mmHg  Temp(Src) 97.9 F (36.6 C) (Oral)  Wt 211 lb 3.2 oz (95.8 kg)       Objective:   Physical Exam  Constitutional: He appears well-developed and well-nourished. No distress.  Cardiovascular: Normal rate, regular rhythm, normal heart sounds and intact distal pulses.  Exam reveals no gallop.   No murmur heard. Pulmonary/Chest: Effort normal and breath sounds normal.  Musculoskeletal: He exhibits no edema or tenderness.  No pain with palpation over AC joint. Does have loss of strength in left rotator cuff approx 3/5. Mild crepitis  Neurological: He is alert. He has normal  reflexes. He displays normal reflexes. No cranial nerve deficit. He exhibits normal muscle tone. Coordination normal.  Skin: Skin is warm and dry. No rash noted. No erythema.  Psychiatric: He has a normal mood and affect. His behavior is normal. Judgment and thought content normal.  Vitals reviewed.     Assessment & Plan:  1. Left shoulder pain - MR Shoulder Left Wo Contrast; Future - Refer back to sports medicine.  - Consider gen surg referral.  - follow up as needed  Dorothyann Peng, NP

## 2015-07-14 NOTE — Patient Instructions (Signed)
It was great seeing you today.   I will gt you set up for your MRI and after that is done we will have you follow up with Dr. Tamala Julian.

## 2015-07-17 ENCOUNTER — Telehealth: Payer: Self-pay | Admitting: Internal Medicine

## 2015-07-17 ENCOUNTER — Other Ambulatory Visit: Payer: Self-pay

## 2015-07-17 MED ORDER — VALSARTAN-HYDROCHLOROTHIAZIDE 320-25 MG PO TABS: 1.0000 | ORAL_TABLET | Freq: Every day | ORAL | Status: DC

## 2015-07-17 NOTE — Telephone Encounter (Signed)
What BP med?

## 2015-07-17 NOTE — Telephone Encounter (Signed)
I contacted patient and he states he needs refill on Diovan. Refill sent in. Thanks.

## 2015-07-17 NOTE — Telephone Encounter (Signed)
Pt said bp med

## 2015-07-17 NOTE — Telephone Encounter (Signed)
Pt was seen on 07-14-15 and still waiting on ? bp med to be send to [facility]

## 2015-07-19 ENCOUNTER — Ambulatory Visit
Admission: RE | Admit: 2015-07-19 | Discharge: 2015-07-19 | Disposition: A | Discharge status: Home / Self Care | Payer: BLUE CROSS/BLUE SHIELD | Source: Ambulatory Visit | Attending: Adult Health | Admitting: Adult Health

## 2015-07-19 ENCOUNTER — Other Ambulatory Visit: Payer: Self-pay | Admitting: Adult Health

## 2015-07-19 DIAGNOSIS — H0553 Retained (old) foreign body following penetrating wound of bilateral orbits: Secondary | ICD-10-CM

## 2015-07-19 DIAGNOSIS — Z189 Retained foreign body fragments, unspecified material: Principal | ICD-10-CM

## 2015-07-19 DIAGNOSIS — M25512 Pain in left shoulder: Secondary | ICD-10-CM

## 2015-07-21 ENCOUNTER — Ambulatory Visit
Admission: RE | Admit: 2015-07-21 | Discharge: 2015-07-21 | Disposition: A | Discharge status: Home / Self Care | Payer: BLUE CROSS/BLUE SHIELD | Source: Ambulatory Visit | Attending: Adult Health | Admitting: Adult Health

## 2015-07-21 DIAGNOSIS — Z01818 Encounter for other preprocedural examination: Secondary | ICD-10-CM | POA: Diagnosis not present

## 2015-07-21 DIAGNOSIS — H0553 Retained (old) foreign body following penetrating wound of bilateral orbits: Secondary | ICD-10-CM

## 2015-07-21 DIAGNOSIS — Z189 Retained foreign body fragments, unspecified material: Principal | ICD-10-CM

## 2015-07-23 ENCOUNTER — Ambulatory Visit: Admission: RE | Admit: 2015-07-23 | Payer: BLUE CROSS/BLUE SHIELD | Source: Ambulatory Visit

## 2015-07-23 ENCOUNTER — Ambulatory Visit
Admission: RE | Admit: 2015-07-23 | Discharge: 2015-07-23 | Disposition: A | Discharge status: Home / Self Care | Payer: BLUE CROSS/BLUE SHIELD | Source: Ambulatory Visit | Attending: Adult Health | Admitting: Adult Health

## 2015-07-23 ENCOUNTER — Other Ambulatory Visit: Payer: Self-pay | Admitting: Adult Health

## 2015-07-23 DIAGNOSIS — M25512 Pain in left shoulder: Secondary | ICD-10-CM

## 2015-07-23 DIAGNOSIS — M75122 Complete rotator cuff tear or rupture of left shoulder, not specified as traumatic: Secondary | ICD-10-CM | POA: Diagnosis not present

## 2015-07-26 ENCOUNTER — Other Ambulatory Visit: Payer: Self-pay | Admitting: Adult Health

## 2015-07-26 DIAGNOSIS — M75102 Unspecified rotator cuff tear or rupture of left shoulder, not specified as traumatic: Secondary | ICD-10-CM

## 2015-07-28 ENCOUNTER — Other Ambulatory Visit: Payer: Self-pay | Admitting: Adult Health

## 2015-07-28 ENCOUNTER — Telehealth: Payer: Self-pay | Admitting: Adult Health

## 2015-07-28 DIAGNOSIS — M25512 Pain in left shoulder: Secondary | ICD-10-CM | POA: Diagnosis not present

## 2015-07-28 MED ORDER — VARENICLINE TARTRATE 0.5 MG X 11 & 1 MG X 42 PO MISC: ORAL | Status: DC

## 2015-07-28 MED ORDER — VARENICLINE TARTRATE 1 MG PO TABS: 1.0000 mg | ORAL_TABLET | Freq: Two times a day (BID) | ORAL | Status: DC

## 2015-07-28 NOTE — Telephone Encounter (Signed)
Spoke to [place] and he informed me that he has tried Wellbutrin and it has not helped him quit smoking. He would like to try chantix. Prescription sent in

## 2015-08-04 ENCOUNTER — Encounter (HOSPITAL_BASED_OUTPATIENT_CLINIC_OR_DEPARTMENT_OTHER): Payer: Self-pay | Admitting: *Deleted

## 2015-08-05 ENCOUNTER — Encounter (HOSPITAL_BASED_OUTPATIENT_CLINIC_OR_DEPARTMENT_OTHER)
Admission: RE | Admit: 2015-08-05 | Discharge: 2015-08-05 | Disposition: A | Discharge status: Home / Self Care | Payer: BLUE CROSS/BLUE SHIELD | Source: Ambulatory Visit | Attending: Orthopedic Surgery | Admitting: Orthopedic Surgery

## 2015-08-05 DIAGNOSIS — Z01812 Encounter for preprocedural laboratory examination: Secondary | ICD-10-CM | POA: Diagnosis not present

## 2015-08-05 DIAGNOSIS — Z01810 Encounter for preprocedural cardiovascular examination: Secondary | ICD-10-CM | POA: Diagnosis not present

## 2015-08-05 DIAGNOSIS — I10 Essential (primary) hypertension: Secondary | ICD-10-CM | POA: Diagnosis not present

## 2015-08-05 LAB — BASIC METABOLIC PANEL
ANION GAP: 5 (ref 5–15)
BUN: 9 mg/dL (ref 6–20)
CO2: 30 mmol/L (ref 22–32)
Calcium: 9.6 mg/dL (ref 8.9–10.3)
Chloride: 105 mmol/L (ref 101–111)
Creatinine, Ser: 1.09 mg/dL (ref 0.61–1.24)
GLUCOSE: 114 mg/dL — AB (ref 65–99)
Potassium: 3.6 mmol/L (ref 3.5–5.1)
Sodium: 140 mmol/L (ref 135–145)

## 2015-08-06 NOTE — H&P (Signed)
Scott Carson is seen in consultation today for his left shoulder.  Correspondence from [place] reviewed.  Because of initial constraints of his job and issues he did not pursue initial workup after his injury between 1-2 years ago.  Subsequent imaging and then repeat imaging reveals a full thickness rotator cuff tear with increasing retraction and some degree of muscular atrophy.  The most recent scan report I could find dated July 23, 2015 revealed a full thickness retracted tear supraspinatus between 3-3.5 centimeters.  They describe mild, not extreme, atrophy of the supraspinatus.  He does however have a superior migration of his humeral head.  His issue is continued pain and loss of function.  He cannot really do anything active overhead.  This has gotten to a point where he wants to pursue definitive treatment.  I met with him, reviewed all of the notes from [facility].  I have explained in some detail that it is so long since his initial trauma that I am extremely concerned that his cuff may no longer be repairable because of degree of retraction.  He is obviously miserable and something needs to be done.   His history, scan reports and actual scans reviewed.    EXAMINATION: General exam is outlined and included in the chart.  Other than his shoulder he is otherwise in good health.  Specifically, this is a 45 year-old male.  Height: 5?9.  Weight: 210 pounds.  Lungs clear to auscultation bilaterally.  Heart sounds normal.  Full motion right shoulder.  Good strength.  On the left I can get him through just about full passive motion, but he cannot even bring his arm overhead because of weakness.  I think his biceps is intact.  There is definitely some global atrophy around his shoulder, but this is not extreme over the supraspinatus.  Neurovascularly intact distally.    X-RAYS: Three view x-ray shows a Type IV acromion.  Significant superior migration humeral head, not quite to the acromion.   Degenerative changes AC joint.  Glenohumeral joint does not have extreme degenerative changes.    DISPOSITION:  The sooner something is done the better, but to be very honest I am very concerned that repair may no longer be an option at all.  I have explained this in some detail to [place].  Plan is exam under anesthesia, arthroscopy, decompression, clean everything out and then look at his cuff.  I am hoping I can do something to repair this.  If not, the salvage procedure would be a reverse total shoulder which would be an awfully tenuous thing to do in this young a person.  I want to wait and see how he does with decompression and hopefully repair and then make further decision.  I have been very upfront that the amount of time that has gone by since his tear has quite likely make this irreparable.  More than 45 minutes spent covering everything and reviewing everything face-to-face with Beverely Low.    Ninetta Lights, M.D.

## 2015-08-07 ENCOUNTER — Other Ambulatory Visit: Payer: Self-pay | Admitting: Internal Medicine

## 2015-08-07 ENCOUNTER — Other Ambulatory Visit: Payer: Self-pay | Admitting: Physician Assistant

## 2015-08-11 ENCOUNTER — Other Ambulatory Visit: Payer: Self-pay | Admitting: Gastroenterology

## 2015-08-13 ENCOUNTER — Ambulatory Visit (HOSPITAL_BASED_OUTPATIENT_CLINIC_OR_DEPARTMENT_OTHER)
Admission: RE | Admit: 2015-08-13 | Discharge: 2015-08-13 | Disposition: A | Discharge status: Home / Self Care | Payer: BLUE CROSS/BLUE SHIELD | Source: Ambulatory Visit | Attending: Orthopedic Surgery | Admitting: Orthopedic Surgery

## 2015-08-13 ENCOUNTER — Encounter (HOSPITAL_BASED_OUTPATIENT_CLINIC_OR_DEPARTMENT_OTHER)
Admission: RE | Disposition: A | Discharge status: Home / Self Care | Payer: Self-pay | Source: Ambulatory Visit | Attending: Orthopedic Surgery

## 2015-08-13 ENCOUNTER — Ambulatory Visit (HOSPITAL_BASED_OUTPATIENT_CLINIC_OR_DEPARTMENT_OTHER): Payer: BLUE CROSS/BLUE SHIELD | Admitting: Anesthesiology

## 2015-08-13 ENCOUNTER — Encounter (HOSPITAL_BASED_OUTPATIENT_CLINIC_OR_DEPARTMENT_OTHER): Payer: Self-pay | Admitting: Anesthesiology

## 2015-08-13 DIAGNOSIS — S43492A Other sprain of left shoulder joint, initial encounter: Secondary | ICD-10-CM | POA: Diagnosis not present

## 2015-08-13 DIAGNOSIS — I10 Essential (primary) hypertension: Secondary | ICD-10-CM | POA: Insufficient documentation

## 2015-08-13 DIAGNOSIS — F17200 Nicotine dependence, unspecified, uncomplicated: Secondary | ICD-10-CM | POA: Insufficient documentation

## 2015-08-13 DIAGNOSIS — S46012A Strain of muscle(s) and tendon(s) of the rotator cuff of left shoulder, initial encounter: Secondary | ICD-10-CM | POA: Insufficient documentation

## 2015-08-13 DIAGNOSIS — M898X1 Other specified disorders of bone, shoulder: Secondary | ICD-10-CM | POA: Insufficient documentation

## 2015-08-13 DIAGNOSIS — M19012 Primary osteoarthritis, left shoulder: Secondary | ICD-10-CM | POA: Diagnosis not present

## 2015-08-13 DIAGNOSIS — X58XXXA Exposure to other specified factors, initial encounter: Secondary | ICD-10-CM | POA: Insufficient documentation

## 2015-08-13 DIAGNOSIS — M7542 Impingement syndrome of left shoulder: Secondary | ICD-10-CM | POA: Insufficient documentation

## 2015-08-13 DIAGNOSIS — M25512 Pain in left shoulder: Secondary | ICD-10-CM | POA: Diagnosis not present

## 2015-08-13 DIAGNOSIS — Z79899 Other long term (current) drug therapy: Secondary | ICD-10-CM | POA: Insufficient documentation

## 2015-08-13 DIAGNOSIS — M24112 Other articular cartilage disorders, left shoulder: Secondary | ICD-10-CM | POA: Diagnosis not present

## 2015-08-13 DIAGNOSIS — G8918 Other acute postprocedural pain: Secondary | ICD-10-CM | POA: Diagnosis not present

## 2015-08-13 HISTORY — PX: RESECTION DISTAL CLAVICAL: SHX5053

## 2015-08-13 HISTORY — PX: SHOULDER ARTHROSCOPY WITH ROTATOR CUFF REPAIR AND SUBACROMIAL DECOMPRESSION: SHX5686

## 2015-08-13 SURGERY — SHOULDER ARTHROSCOPY WITH ROTATOR CUFF REPAIR AND SUBACROMIAL DECOMPRESSION
Anesthesia: General | Site: Shoulder | Laterality: Left

## 2015-08-13 MED ORDER — LACTATED RINGERS IV SOLN
INTRAVENOUS | Status: DC
Start: 2015-08-13 — End: 2015-08-13

## 2015-08-13 MED ORDER — SCOPOLAMINE 1 MG/3DAYS TD PT72: 1.0000 | MEDICATED_PATCH | Freq: Once | TRANSDERMAL | Status: DC | PRN

## 2015-08-13 MED ORDER — METOCLOPRAMIDE HCL 5 MG PO TABS: 5.0000 mg | ORAL_TABLET | Freq: Three times a day (TID) | ORAL | Status: DC | PRN

## 2015-08-13 MED ORDER — MIDAZOLAM HCL 2 MG/2ML IJ SOLN
INTRAMUSCULAR | Status: AC
  Filled 2015-08-13: qty 2

## 2015-08-13 MED ORDER — ONDANSETRON HCL 4 MG PO TABS: 4.0000 mg | ORAL_TABLET | Freq: Three times a day (TID) | ORAL | Status: DC | PRN

## 2015-08-13 MED ORDER — MIDAZOLAM HCL 2 MG/2ML IJ SOLN
1.0000 mg | INTRAMUSCULAR | Status: DC | PRN
  Administered 2015-08-13: 2 mg via INTRAVENOUS

## 2015-08-13 MED ORDER — DEXAMETHASONE SODIUM PHOSPHATE 4 MG/ML IJ SOLN
INTRAMUSCULAR | Status: DC | PRN
  Administered 2015-08-13: 10 mg via INTRAVENOUS

## 2015-08-13 MED ORDER — LIDOCAINE HCL (CARDIAC) 20 MG/ML IV SOLN
INTRAVENOUS | Status: DC | PRN
  Administered 2015-08-13: 80 mg via INTRAVENOUS

## 2015-08-13 MED ORDER — FENTANYL CITRATE (PF) 100 MCG/2ML IJ SOLN
INTRAMUSCULAR | Status: AC
  Filled 2015-08-13: qty 2

## 2015-08-13 MED ORDER — FENTANYL CITRATE (PF) 100 MCG/2ML IJ SOLN
50.0000 ug | INTRAMUSCULAR | Status: DC | PRN
  Administered 2015-08-13: 50 ug via INTRAVENOUS
  Administered 2015-08-13: 100 ug via INTRAVENOUS

## 2015-08-13 MED ORDER — HYDROMORPHONE HCL 1 MG/ML IJ SOLN: 0.2500 mg | INTRAMUSCULAR | Status: DC | PRN

## 2015-08-13 MED ORDER — CEFAZOLIN SODIUM-DEXTROSE 2-4 GM/100ML-% IV SOLN
INTRAVENOUS | Status: AC
  Filled 2015-08-13: qty 100

## 2015-08-13 MED ORDER — PHENYLEPHRINE HCL 10 MG/ML IJ SOLN
INTRAMUSCULAR | Status: AC
  Filled 2015-08-13: qty 1

## 2015-08-13 MED ORDER — LACTATED RINGERS IV SOLN
INTRAVENOUS | Status: DC
  Administered 2015-08-13: 07:00:00 via INTRAVENOUS

## 2015-08-13 MED ORDER — PROPOFOL 10 MG/ML IV BOLUS
INTRAVENOUS | Status: DC | PRN
  Administered 2015-08-13: 200 mg via INTRAVENOUS
  Administered 2015-08-13: 100 mg via INTRAVENOUS

## 2015-08-13 MED ORDER — SUCCINYLCHOLINE CHLORIDE 200 MG/10ML IV SOSY
PREFILLED_SYRINGE | INTRAVENOUS | Status: AC
  Filled 2015-08-13: qty 10

## 2015-08-13 MED ORDER — CEFAZOLIN SODIUM-DEXTROSE 2-4 GM/100ML-% IV SOLN
2.0000 g | INTRAVENOUS | Status: AC
  Administered 2015-08-13: 2 g via INTRAVENOUS

## 2015-08-13 MED ORDER — DEXAMETHASONE SODIUM PHOSPHATE 10 MG/ML IJ SOLN
INTRAMUSCULAR | Status: AC
  Filled 2015-08-13: qty 1

## 2015-08-13 MED ORDER — ALBUTEROL SULFATE HFA 108 (90 BASE) MCG/ACT IN AERS
INHALATION_SPRAY | RESPIRATORY_TRACT | Status: DC | PRN
  Administered 2015-08-13: 2 via RESPIRATORY_TRACT

## 2015-08-13 MED ORDER — OXYCODONE-ACETAMINOPHEN 5-325 MG PO TABS: 1.0000 | ORAL_TABLET | ORAL | Status: DC | PRN

## 2015-08-13 MED ORDER — PROPOFOL 500 MG/50ML IV EMUL
INTRAVENOUS | Status: AC
  Filled 2015-08-13: qty 50

## 2015-08-13 MED ORDER — OXYCODONE HCL 5 MG PO TABS: 5.0000 mg | ORAL_TABLET | Freq: Once | ORAL | Status: DC | PRN

## 2015-08-13 MED ORDER — METHOCARBAMOL 500 MG PO TABS: 500.0000 mg | ORAL_TABLET | Freq: Four times a day (QID) | ORAL | Status: DC | PRN

## 2015-08-13 MED ORDER — EPHEDRINE 5 MG/ML INJ
INTRAVENOUS | Status: AC
  Filled 2015-08-13: qty 10

## 2015-08-13 MED ORDER — HYDROMORPHONE HCL 1 MG/ML IJ SOLN: 0.5000 mg | INTRAMUSCULAR | Status: DC | PRN

## 2015-08-13 MED ORDER — OXYCODONE HCL 5 MG/5ML PO SOLN: 5.0000 mg | Freq: Once | ORAL | Status: DC | PRN

## 2015-08-13 MED ORDER — CHLORHEXIDINE GLUCONATE 4 % EX LIQD: 60.0000 mL | Freq: Once | CUTANEOUS | Status: DC

## 2015-08-13 MED ORDER — ONDANSETRON HCL 4 MG/2ML IJ SOLN
INTRAMUSCULAR | Status: AC
  Filled 2015-08-13: qty 2

## 2015-08-13 MED ORDER — ONDANSETRON HCL 4 MG PO TABS: 4.0000 mg | ORAL_TABLET | Freq: Four times a day (QID) | ORAL | Status: DC | PRN

## 2015-08-13 MED ORDER — SODIUM CHLORIDE 0.9 % IR SOLN
Status: DC | PRN
  Administered 2015-08-13 (×2): 6000 mL

## 2015-08-13 MED ORDER — ALBUTEROL SULFATE HFA 108 (90 BASE) MCG/ACT IN AERS
INHALATION_SPRAY | RESPIRATORY_TRACT | Status: AC
  Filled 2015-08-13: qty 6.7

## 2015-08-13 MED ORDER — SUCCINYLCHOLINE CHLORIDE 20 MG/ML IJ SOLN
INTRAMUSCULAR | Status: DC | PRN
  Administered 2015-08-13: 100 mg via INTRAVENOUS

## 2015-08-13 MED ORDER — GLYCOPYRROLATE 0.2 MG/ML IJ SOLN: 0.2000 mg | Freq: Once | INTRAMUSCULAR | Status: DC | PRN

## 2015-08-13 MED ORDER — LIDOCAINE 2% (20 MG/ML) 5 ML SYRINGE
INTRAMUSCULAR | Status: AC
  Filled 2015-08-13: qty 5

## 2015-08-13 MED ORDER — BUPIVACAINE-EPINEPHRINE (PF) 0.5% -1:200000 IJ SOLN
INTRAMUSCULAR | Status: DC | PRN
  Administered 2015-08-13: 25 mL via PERINEURAL

## 2015-08-13 MED ORDER — METOCLOPRAMIDE HCL 5 MG/ML IJ SOLN: 5.0000 mg | Freq: Three times a day (TID) | INTRAMUSCULAR | Status: DC | PRN

## 2015-08-13 MED ORDER — DEXTROSE 5 % IV SOLN
10.0000 mg | INTRAVENOUS | Status: DC | PRN
  Administered 2015-08-13: 50 ug/min via INTRAVENOUS

## 2015-08-13 MED ORDER — METHOCARBAMOL 1000 MG/10ML IJ SOLN: 500.0000 mg | Freq: Four times a day (QID) | INTRAVENOUS | Status: DC | PRN

## 2015-08-13 MED ORDER — ONDANSETRON HCL 4 MG/2ML IJ SOLN: 4.0000 mg | Freq: Four times a day (QID) | INTRAMUSCULAR | Status: DC | PRN

## 2015-08-13 MED ORDER — EPHEDRINE SULFATE 50 MG/ML IJ SOLN
INTRAMUSCULAR | Status: DC | PRN
  Administered 2015-08-13 (×2): 10 mg via INTRAVENOUS

## 2015-08-13 MED ORDER — MEPERIDINE HCL 25 MG/ML IJ SOLN: 6.2500 mg | INTRAMUSCULAR | Status: DC | PRN

## 2015-08-13 SURGICAL SUPPLY — 76 items
ANCHOR SUT BIO SW 4.75X19.1 (Anchor) ×6 IMPLANT
BENZOIN TINCTURE PRP APPL 2/3 (GAUZE/BANDAGES/DRESSINGS) IMPLANT
BLADE CUTTER GATOR 3.5 (BLADE) ×2 IMPLANT
BLADE CUTTER MENIS 5.5 (BLADE) IMPLANT
BLADE GREAT WHITE 4.2 (BLADE) ×2 IMPLANT
BLADE SURG 15 STRL LF DISP TIS (BLADE) IMPLANT
BLADE SURG 15 STRL SS (BLADE)
BNDG COHESIVE 4X5 TAN STRL (GAUZE/BANDAGES/DRESSINGS) ×2 IMPLANT
BUR OVAL 6.0 (BURR) ×2 IMPLANT
CANNULA DRY DOC 8X75 (CANNULA) ×2 IMPLANT
CANNULA TWIST IN 8.25X7CM (CANNULA) IMPLANT
DECANTER SPIKE VIAL GLASS SM (MISCELLANEOUS) IMPLANT
DRAPE STERI 35X30 U-POUCH (DRAPES) ×4 IMPLANT
DRAPE U-SHAPE 47X51 STRL (DRAPES) ×4 IMPLANT
DRAPE U-SHAPE 76X120 STRL (DRAPES) ×8 IMPLANT
DRSG PAD ABDOMINAL 8X10 ST (GAUZE/BANDAGES/DRESSINGS) ×2 IMPLANT
DURAPREP 26ML APPLICATOR (WOUND CARE) ×2 IMPLANT
ELECT MENISCUS 165MM 90D (ELECTRODE) ×2 IMPLANT
ELECT REM PT RETURN 9FT ADLT (ELECTROSURGICAL) ×2
ELECTRODE REM PT RTRN 9FT ADLT (ELECTROSURGICAL) ×1 IMPLANT
GAUZE SPONGE 4X4 12PLY STRL (GAUZE/BANDAGES/DRESSINGS) ×4 IMPLANT
GAUZE XEROFORM 1X8 LF (GAUZE/BANDAGES/DRESSINGS) ×2 IMPLANT
GLOVE BIOGEL PI IND STRL 7.0 (GLOVE) ×4 IMPLANT
GLOVE BIOGEL PI IND STRL 7.5 (GLOVE) ×1 IMPLANT
GLOVE BIOGEL PI INDICATOR 7.0 (GLOVE) ×4
GLOVE BIOGEL PI INDICATOR 7.5 (GLOVE) ×1
GLOVE ECLIPSE 6.5 STRL STRAW (GLOVE) ×2 IMPLANT
GLOVE ECLIPSE 7.0 STRL STRAW (GLOVE) ×2 IMPLANT
GLOVE SURG ORTHO 8.0 STRL STRW (GLOVE) ×2 IMPLANT
GLOVE SURG SS PI 7.5 STRL IVOR (GLOVE) ×2 IMPLANT
GOWN STRL REUS W/ TWL LRG LVL3 (GOWN DISPOSABLE) ×1 IMPLANT
GOWN STRL REUS W/ TWL XL LVL3 (GOWN DISPOSABLE) ×2 IMPLANT
GOWN STRL REUS W/TWL LRG LVL3 (GOWN DISPOSABLE) ×1
GOWN STRL REUS W/TWL XL LVL3 (GOWN DISPOSABLE) ×2
IMMOBILIZER SHOULDER FOAM XLGE (SOFTGOODS) ×2 IMPLANT
IV NS IRRIG 3000ML ARTHROMATIC (IV SOLUTION) ×10 IMPLANT
MANIFOLD NEPTUNE II (INSTRUMENTS) ×2 IMPLANT
NDL SUT 6 .5 CRC .975X.05 MAYO (NEEDLE) IMPLANT
NEEDLE MAYO TAPER (NEEDLE)
NEEDLE SCORPION MULTI FIRE (NEEDLE) ×2 IMPLANT
NS IRRIG 1000ML POUR BTL (IV SOLUTION) IMPLANT
PACK ARTHROSCOPY DSU (CUSTOM PROCEDURE TRAY) ×2 IMPLANT
PACK BASIN DAY SURGERY FS (CUSTOM PROCEDURE TRAY) ×2 IMPLANT
PASSER SUT SWANSON 36MM LOOP (INSTRUMENTS) IMPLANT
PENCIL BUTTON HOLSTER BLD 10FT (ELECTRODE) ×4 IMPLANT
SET ARTHROSCOPY TUBING (MISCELLANEOUS) ×1
SET ARTHROSCOPY TUBING LN (MISCELLANEOUS) ×1 IMPLANT
SLEEVE SCD COMPRESS KNEE MED (MISCELLANEOUS) ×2 IMPLANT
SLING ARM FOAM STRAP LRG (SOFTGOODS) IMPLANT
SLING ARM IMMOBILIZER LRG (SOFTGOODS) IMPLANT
SLING ARM IMMOBILIZER MED (SOFTGOODS) IMPLANT
SLING ARM MED ADULT FOAM STRAP (SOFTGOODS) IMPLANT
SLING ARM XL FOAM STRAP (SOFTGOODS) IMPLANT
SPONGE LAP 4X18 X RAY DECT (DISPOSABLE) IMPLANT
STOCKINETTE IMPERVIOUS LG (DRAPES) ×2 IMPLANT
STRIP CLOSURE SKIN 1/2X4 (GAUZE/BANDAGES/DRESSINGS) IMPLANT
SUCTION FRAZIER HANDLE 10FR (MISCELLANEOUS)
SUCTION TUBE FRAZIER 10FR DISP (MISCELLANEOUS) IMPLANT
SUT ETHIBOND 2 OS 4 DA (SUTURE) IMPLANT
SUT ETHILON 2 0 FS 18 (SUTURE) IMPLANT
SUT ETHILON 3 0 PS 1 (SUTURE) IMPLANT
SUT FIBERWIRE #2 38 T-5 BLUE (SUTURE)
SUT RETRIEVER MED (INSTRUMENTS) IMPLANT
SUT TIGER TAPE 7 IN WHITE (SUTURE) ×8 IMPLANT
SUT VIC AB 0 CT1 27 (SUTURE)
SUT VIC AB 0 CT1 27XBRD ANBCTR (SUTURE) IMPLANT
SUT VIC AB 2-0 SH 27 (SUTURE)
SUT VIC AB 2-0 SH 27XBRD (SUTURE) IMPLANT
SUT VIC AB 3-0 FS2 27 (SUTURE) IMPLANT
SUTURE FIBERWR #2 38 T-5 BLUE (SUTURE) IMPLANT
TAPE FIBER 2MM 7IN #2 BLUE (SUTURE) ×2 IMPLANT
TOWEL OR 17X24 6PK STRL BLUE (TOWEL DISPOSABLE) ×4 IMPLANT
TOWEL OR NON WOVEN STRL DISP B (DISPOSABLE) ×2 IMPLANT
TUBE CONNECTING 20X1/4 (TUBING) ×2 IMPLANT
WATER STERILE IRR 1000ML POUR (IV SOLUTION) ×2 IMPLANT
YANKAUER SUCT BULB TIP NO VENT (SUCTIONS) IMPLANT

## 2015-08-13 NOTE — Interval H&P Note (Signed)
History and Physical Interval Note:  08/13/2015 7:33 AM  Scott Carson  has presented today for surgery, with the diagnosis of OTHER ARTCULAR CARTILAGE DISORDERS LEFT SHOULDER PRIMARY OSTEOARTHRITIS LEFT SHOUDLER IMPINGEMENT SYNDROME OF LEFT SHOUDLER STRAIN OF MUSCLE AND TENDON OF THE ROTATOR CUFF OF LEFT SHOULDER   The various methods of treatment have been discussed with the patient and family. After consideration of risks, benefits and other options for treatment, the patient has consented to  Procedure(s): LEFT SHOULDER ARTHROSCOPY DEBRIDEMENT ACROMIOPLASTY, DISTAL CLAVICAL EXCISION ROTATOR CUFF REPAIR  (Left) as a surgical intervention .  The patient's history has been reviewed, patient examined, no change in status, stable for surgery.  I have reviewed the patient's chart and labs.  Questions were answered to the patient's satisfaction.     Scott Carson

## 2015-08-13 NOTE — Transfer of Care (Signed)
Immediate Anesthesia Transfer of Care Note  Patient: Scott Carson  Procedure(s) Performed: Procedure(s): LEFT SHOULDER ARTHROSCOPY DEBRIDEMENT ACROMIOPLASTY, DISTAL CLAVICAL EXCISION ROTATOR CUFF REPAIR  (Left) RESECTION DISTAL CLAVICAL (Left)  Patient Location: PACU  Anesthesia Type:General  Level of Consciousness: awake and sedated  Airway & Oxygen Therapy: Patient Spontanous Breathing and Patient connected to face mask oxygen  Post-op Assessment: Report given to RN and Post -op Vital signs reviewed and stable  Post vital signs: Reviewed and stable  Last Vitals:  Filed Vitals:   08/13/15 0705 08/13/15 0710  BP:    Pulse: 63 58  Temp:    Resp: 18 18    Last Pain: There were no vitals filed for this visit.       Complications: No apparent anesthesia complications

## 2015-08-13 NOTE — Anesthesia Postprocedure Evaluation (Signed)
Anesthesia Post Note  Patient: Scott Carson  Procedure(s) Performed: Procedure(s) (LRB): LEFT SHOULDER ARTHROSCOPY DEBRIDEMENT ACROMIOPLASTY, DISTAL CLAVICAL EXCISION ROTATOR CUFF REPAIR  (Left) RESECTION DISTAL CLAVICAL (Left)  Patient location during evaluation: PACU Anesthesia Type: General Level of consciousness: awake and alert Pain management: pain level controlled Vital Signs Assessment: post-procedure vital signs reviewed and stable Respiratory status: spontaneous breathing, nonlabored ventilation and respiratory function stable Cardiovascular status: blood pressure returned to baseline and stable Postop Assessment: no signs of nausea or vomiting Anesthetic complications: no    Last Vitals:  Filed Vitals:   08/13/15 0945 08/13/15 1000  BP:  128/89  Pulse:  69  Temp:    Resp: 15 16    Last Pain:  Filed Vitals:   08/13/15 1008  PainSc: 0-No pain                 Kc Sedlak A

## 2015-08-13 NOTE — OR Nursing (Signed)
Pt reprepped and draped  After adjustment mad to ET tube

## 2015-08-13 NOTE — Progress Notes (Signed)
Assisted Dr. Crews with left, ultrasound guided, interscalene  block. Side rails up, monitors on throughout procedure. See vital signs in flow sheet. Tolerated Procedure well. 

## 2015-08-13 NOTE — Anesthesia Preprocedure Evaluation (Addendum)
Anesthesia Evaluation  Patient identified by MRN, date of birth, ID band Patient awake    Reviewed: Allergy & Precautions, NPO status , Patient's Chart, lab work & pertinent test results  Airway Mallampati: I  TM Distance: >3 FB Neck ROM: Full    Dental  (+) Missing, Teeth Intact, Dental Advisory Given   Pulmonary Current Smoker,    breath sounds clear to auscultation       Cardiovascular hypertension, Pt. on medications and Pt. on home beta blockers  Rhythm:Regular Rate:Normal     Neuro/Psych    GI/Hepatic   Endo/Other    Renal/GU      Musculoskeletal   Abdominal   Peds  Hematology   Anesthesia Other Findings   Reproductive/Obstetrics                           Anesthesia Physical Anesthesia Plan  ASA: II  Anesthesia Plan: General   Post-op Pain Management: GA combined w/ Regional for post-op pain   Induction: Intravenous  Airway Management Planned: Oral ETT  Additional Equipment:   Intra-op Plan:   Post-operative Plan: Extubation in OR  Informed Consent: I have reviewed the patients History and Physical, chart, labs and discussed the procedure including the risks, benefits and alternatives for the proposed anesthesia with the patient or authorized representative who has indicated his/her understanding and acceptance.   Dental advisory given  Plan Discussed with: CRNA, Anesthesiologist and Surgeon  Anesthesia Plan Comments:        Anesthesia Quick Evaluation

## 2015-08-13 NOTE — Discharge Instructions (Signed)
Shouder arthroscopy, rotator cuff repair, subacromial decompression °Care After Instructions °Refer to this sheet in the next few weeks. These discharge instructions provide you with general information on caring for yourself after you leave the hospital. Your caregiver may also give you specific instructions. Your treatment has been planned according to the most current medical practices available, but unavoidable complications sometimes occur. If you have any problems or questions after discharge, please call your caregiver. °HOME INSTRUCTIONS °You may resume a normal diet and activities as directed.  °Take showers instead of baths until informed otherwise.  °Change bandages (dressings) in 3 days.  Swab wounds daily with betadine.  Wash shoulder with soap and water.  Pat dry.  Cover wounds with bandaids. °Only take over-the-counter or prescription medicines for pain, discomfort, or fever as directed by your caregiver.  °Wear your sling for the next 6 weeks unless otherwise instructed. °Eat a well-balanced diet.  °Avoid lifting or driving until you are instructed otherwise.  °Make an appointment to see your caregiver for stitches (suture) or staple removal one week after surgery.  ° °SEEK MEDICAL CARE IF: °You have swelling of your calf or leg.  °You develop shortness of breath or chest pain.  °You have redness, swelling, or increasing pain in the wound.  °There is pus or any unusual drainage coming from the surgical site.  °You notice a bad smell coming from the surgical site or dressing.  °The surgical site breaks open after sutures or staples have been removed.  °There is persistent bleeding from the suture or staple line.  °You are getting worse or are not improving.  °You have any other questions or concerns.  °SEEK IMMEDIATE MEDICAL CARE IF:  °You have a fever greater than 101 °You develop a rash.  °You have difficulty breathing.  °You develop any reaction or side effects to medicines given.  °Your knee  motion is decreasing rather than improving.  °MAKE SURE YOU:  °Understand these instructions.  °Will watch your condition.  °Will get help right away if you are not doing well or get worse.  ° ° ° °Regional Anesthesia Blocks ° °1. Numbness or the inability to move the "blocked" extremity may last from 3-48 hours after placement. The length of time depends on the medication injected and your individual response to the medication. If the numbness is not going away after 48 hours, call your surgeon. ° °2. The extremity that is blocked will need to be protected until the numbness is gone and the  Strength has returned. Because you cannot feel it, you will need to take extra care to avoid injury. Because it may be weak, you may have difficulty moving it or using it. You may not know what position it is in without looking at it while the block is in effect. ° °3. For blocks in the legs and feet, returning to weight bearing and walking needs to be done carefully. You will need to wait until the numbness is entirely gone and the strength has returned. You should be able to move your leg and foot normally before you try and bear weight or walk. You will need someone to be with you when you first try to ensure you do not fall and possibly risk injury. ° °4. Bruising and tenderness at the needle site are common side effects and will resolve in a few days. ° °5. Persistent numbness or new problems with movement should be communicated to the surgeon or the [facility] (  [phone] [place] [phone]).Regional Anesthesia Blocks  1. Numbness or the inability to move the "blocked" extremity may last from 3-48 hours after placement. The length of time depends on the medication injected and your individual response to the medication. If the numbness is not going away after 48 hours, call your surgeon.  2. The extremity that is blocked will need to be protected until the numbness is gone and the   Strength has returned. Because you cannot feel it, you will need to take extra care to avoid injury. Because it may be weak, you may have difficulty moving it or using it. You may not know what position it is in without looking at it while the block is in effect.  3. For blocks in the legs and feet, returning to weight bearing and walking needs to be done carefully. You will need to wait until the numbness is entirely gone and the strength has returned. You should be able to move your leg and foot normally before you try and bear weight or walk. You will need someone to be with you when you first try to ensure you do not fall and possibly risk injury.  4. Bruising and tenderness at the needle site are common side effects and will resolve in a few days.  5. Persistent numbness or new problems with movement should be communicated to the surgeon or the [facility] [phone] [place] [phone]).Regional Anesthesia Blocks  1. Numbness or the inability to move the "blocked" extremity may last from 3-48 hours after placement. The length of time depends on the medication injected and your individual response to the medication. If the numbness is not going away after 48 hours, call your surgeon.  2. The extremity that is blocked will need to be protected until the numbness is gone and the  Strength has returned. Because you cannot feel it, you will need to take extra care to avoid injury. Because it may be weak, you may have difficulty moving it or using it. You may not know what position it is in without looking at it while the block is in effect.  3. For blocks in the legs and feet, returning to weight bearing and walking needs to be done carefully. You will need to wait until the numbness is entirely gone and the strength has returned. You should be able to move your leg and foot normally before you try and bear weight or walk. You will need someone to be with you when you  first try to ensure you do not fall and possibly risk injury.  4. Bruising and tenderness at the needle site are common side effects and will resolve in a few days.  5. Persistent numbness or new problems with movement should be communicated to the surgeon or the [place] [phone] [place] [phone]).Regional Anesthesia Blocks  1. Numbness or the inability to move the "blocked" extremity may last from 3-48 hours after placement. The length of time depends on the medication injected and your individual response to the medication. If the numbness is not going away after 48 hours, call your surgeon.  2. The extremity that is blocked will need to be protected until the numbness is gone and the  Strength has returned. Because you cannot feel it, you will need to take extra care to avoid injury. Because it may be weak, you may have difficulty moving it or using it. You may not  know what position it is in without looking at it while the block is in effect.  3. For blocks in the legs and feet, returning to weight bearing and walking needs to be done carefully. You will need to wait until the numbness is entirely gone and the strength has returned. You should be able to move your leg and foot normally before you try and bear weight or walk. You will need someone to be with you when you first try to ensure you do not fall and possibly risk injury.  4. Bruising and tenderness at the needle site are common side effects and will resolve in a few days.  5. Persistent numbness or new problems with movement should be communicated to the surgeon or the [place] [phone] [place] [phone]).    Post Anesthesia Home Care Instructions  Activity: Get plenty of rest for the remainder of the day. A responsible adult should stay with you for 24 hours following the procedure.  For the next 24 hours, DO NOT: -Drive a car -Conservation officer, nature -Drink alcoholic beverages -Take any medication unless instructed by your physician -Make any legal decisions or sign important papers.  Meals: Start with liquid foods such as gelatin or soup. Progress to regular foods as tolerated. Avoid greasy, spicy, heavy foods. If nausea and/or vomiting occur, drink only clear liquids until the nausea and/or vomiting subsides. Call your physician if vomiting continues.  Special Instructions/Symptoms: Your throat may feel dry or sore from the anesthesia or the breathing tube placed in your throat during surgery. If this causes discomfort, gargle with warm salt water. The discomfort should disappear within 24 hours.  If you had a scopolamine patch placed behind your ear for the management of post- operative nausea and/or vomiting:  1. The medication in the patch is effective for 72 hours, after which it should be removed.  Wrap patch in a tissue and discard in the trash. Wash hands thoroughly with soap and water. 2. You may remove the patch earlier than 72 hours if you experience unpleasant side effects which may include dry mouth, dizziness or visual disturbances. 3. Avoid touching the patch. Wash your hands with soap and water after contact with the patch.

## 2015-08-13 NOTE — Anesthesia Procedure Notes (Addendum)
Anesthesia Regional Block:  Interscalene brachial plexus block  Pre-Anesthetic Checklist: ,, timeout performed, Correct Patient, Correct Site, Correct Laterality, Correct Procedure, Correct Position, site marked, Risks and benefits discussed,  Surgical consent,  Pre-op evaluation,  At surgeon's request and post-op pain management  Laterality: Left and Upper  Prep: chloraprep       Needles:  Injection technique: Single-shot  Needle Type: Echogenic Needle     Needle Length: 5cm 5 cm Needle Gauge: 21 and 21 G    Additional Needles:  Procedures: ultrasound guided (picture in chart) Interscalene brachial plexus block Narrative:  Start time: 08/13/2015 7:06 AM End time: 08/13/2015 7:11 AM Injection made incrementally with aspirations every 5 mL.  Performed by: Personally  Anesthesiologist: CREWS, DAVID   Procedure Name: Intubation Performed by: Terrance Mass Pre-anesthesia Checklist: Patient identified, Emergency Drugs available, Suction available and Patient being monitored Patient Re-evaluated:Patient Re-evaluated prior to inductionOxygen Delivery Method: Circle system utilized Preoxygenation: Pre-oxygenation with 100% oxygen Intubation Type: IV induction Ventilation: Mask ventilation without difficulty Laryngoscope Size: Miller and 2 Tube type: Oral Tube size: 7.0 mm Number of attempts: 1 Airway Equipment and Method: Stylet and Oral airway Placement Confirmation: ETT inserted through vocal cords under direct vision,  positive ETCO2 and breath sounds checked- equal and bilateral Secured at: 22 cm Tube secured with: Tape Dental Injury: Teeth and Oropharynx as per pre-operative assessment       Left ISB image

## 2015-08-14 ENCOUNTER — Encounter (HOSPITAL_BASED_OUTPATIENT_CLINIC_OR_DEPARTMENT_OTHER): Payer: Self-pay | Admitting: Orthopedic Surgery

## 2015-08-14 NOTE — Op Note (Signed)
NAME:  Scott Carson, Scott Carson                ACCOUNT NO.:  [id number]  MEDICAL RECORD NO.:  [id number]  LOCATION:                                 FACILITY:  PHYSICIAN:  Ninetta Lights, M.D. DATE OF BIRTH:  10/22/1970  DATE OF PROCEDURE:  08/13/2015 DATE OF DISCHARGE:                              OPERATIVE REPORT   PREOPERATIVE DIAGNOSES:  Left shoulder chronic, retracted large tear rotator cuff, supra and infraspinatus tendon.  Subacromial impingement. Degenerative joint disease, acromioclavicular joint with spurring. Moderately large mobile os acromiale.  POSTOPERATIVE DIAGNOSES:  Left shoulder chronic, retracted large tear rotator cuff, supra and infraspinatus tendon.  Subacromial impingement. Degenerative joint disease, acromioclavicular joint with spurring. Moderately large mobile os acromiale with a large retracted tear, but still able to be repaired.  Anterior labrum tear.  PROCEDURES:  Left shoulder exam under anesthesia, arthroscopy. Debridement of labrum.  Debridement and mobilization of rotator cuff. Bursectomy, acromioplasty, coracoacromial ligament release.  Excision of spurs on the distal clavicle.  Distal clavicle not excised because with a large mobile os acromiale.  SURGEON:  Ninetta Lights, M.D.  ASSISTANT:  Elmyra Ricks, PA, present throughout the entire case and necessary for timely completion of procedure.  ANESTHESIA:  General.  BLOOD LOSS:  Minimal.  SPECIMENS:  None.  CULTURES:  None.  COMPLICATIONS:  None.  DRESSINGS:  Soft compressive shoulder immobilizer.  DESCRIPTION OF PROCEDURE:  The patient was brought to the operating room and placed on the operating table in supine position.  After adequate anesthesia had been obtained, shoulder was examined.  Full motion with stable shoulder.  Placed in the beach-chair position on the shoulder positioner, prepped and draped in usual sterile fashion.  Three portals; anterior, posterior and lateral.   Arthroscope was introduced, shoulder was distended and inspected.  Glenohumeral joint, minimal degenerative change.  Biceps tendon and biceps anchor intact.  Some tearing of the labral in the front debrided.  The cuff was completely torn off entire supraspinatus and top of the infraspinatus.  Retracted half way of the glenoid.  A lot of intratendinous tearing.  Debrided back to healthy tissue and this was still mobile, we will have to bring it out laterally.  From above, type 3 acromion.  CA ligament released. Acromioplasty to a type 1 acromion, taken down the spurs of the os acromiale, acromion middle space.  The os encompasses the most of the [facility] joint.  As a result, I just burled off the spurs of the bottom of the clavicle, did not completely excise this as I did not want to produce instability of the os.  Through the lateral portal, cannula placed.  The cup was captured with three horizontal mattress sutures with FiberWeave suture.  Anchored down and 2-3 prepunched holes at the roughened tuberosity with SwiveLocks.  At completion, nice firm watertight closure achieved.  Adequacy of the decompression confirmed.  Instruments and fluid were removed.  Portals were closed with nylon.  Sterile compressive dressing applied.  Anesthesia reversed.  Brought to the recovery room.  Tolerated the surgery well.  No complications.     Ninetta Lights, M.D.   ______________________________ Ninetta Lights, M.D.  DFM/MEDQ  D:  08/13/2015  T:  08/13/2015  Job:  [id number]

## 2015-08-21 DIAGNOSIS — S46012D Strain of muscle(s) and tendon(s) of the rotator cuff of left shoulder, subsequent encounter: Secondary | ICD-10-CM | POA: Diagnosis not present

## 2015-08-27 DIAGNOSIS — M7542 Impingement syndrome of left shoulder: Secondary | ICD-10-CM | POA: Diagnosis not present

## 2015-08-27 DIAGNOSIS — M75122 Complete rotator cuff tear or rupture of left shoulder, not specified as traumatic: Secondary | ICD-10-CM | POA: Diagnosis not present

## 2015-08-27 DIAGNOSIS — R531 Weakness: Secondary | ICD-10-CM | POA: Diagnosis not present

## 2015-09-01 ENCOUNTER — Other Ambulatory Visit: Payer: Self-pay | Admitting: Internal Medicine

## 2015-09-01 NOTE — Telephone Encounter (Signed)
Rx refill sent to pharmacy. 

## 2015-09-03 DIAGNOSIS — M75122 Complete rotator cuff tear or rupture of left shoulder, not specified as traumatic: Secondary | ICD-10-CM | POA: Diagnosis not present

## 2015-09-03 DIAGNOSIS — M7542 Impingement syndrome of left shoulder: Secondary | ICD-10-CM | POA: Diagnosis not present

## 2015-09-03 DIAGNOSIS — R531 Weakness: Secondary | ICD-10-CM | POA: Diagnosis not present

## 2015-09-07 DIAGNOSIS — M75122 Complete rotator cuff tear or rupture of left shoulder, not specified as traumatic: Secondary | ICD-10-CM | POA: Diagnosis not present

## 2015-09-07 DIAGNOSIS — R531 Weakness: Secondary | ICD-10-CM | POA: Diagnosis not present

## 2015-09-07 DIAGNOSIS — M7542 Impingement syndrome of left shoulder: Secondary | ICD-10-CM | POA: Diagnosis not present

## 2015-09-09 ENCOUNTER — Other Ambulatory Visit: Payer: Self-pay | Admitting: Gastroenterology

## 2015-09-10 DIAGNOSIS — M7542 Impingement syndrome of left shoulder: Secondary | ICD-10-CM | POA: Diagnosis not present

## 2015-09-10 DIAGNOSIS — R531 Weakness: Secondary | ICD-10-CM | POA: Diagnosis not present

## 2015-09-10 DIAGNOSIS — M75122 Complete rotator cuff tear or rupture of left shoulder, not specified as traumatic: Secondary | ICD-10-CM | POA: Diagnosis not present

## 2015-09-14 DIAGNOSIS — M7542 Impingement syndrome of left shoulder: Secondary | ICD-10-CM | POA: Diagnosis not present

## 2015-09-14 DIAGNOSIS — M75122 Complete rotator cuff tear or rupture of left shoulder, not specified as traumatic: Secondary | ICD-10-CM | POA: Diagnosis not present

## 2015-09-14 DIAGNOSIS — R531 Weakness: Secondary | ICD-10-CM | POA: Diagnosis not present

## 2015-09-17 DIAGNOSIS — R531 Weakness: Secondary | ICD-10-CM | POA: Diagnosis not present

## 2015-09-17 DIAGNOSIS — M75122 Complete rotator cuff tear or rupture of left shoulder, not specified as traumatic: Secondary | ICD-10-CM | POA: Diagnosis not present

## 2015-09-17 DIAGNOSIS — M7542 Impingement syndrome of left shoulder: Secondary | ICD-10-CM | POA: Diagnosis not present

## 2015-09-20 ENCOUNTER — Other Ambulatory Visit: Payer: Self-pay | Admitting: Gastroenterology

## 2015-09-21 DIAGNOSIS — R531 Weakness: Secondary | ICD-10-CM | POA: Diagnosis not present

## 2015-09-21 DIAGNOSIS — M7542 Impingement syndrome of left shoulder: Secondary | ICD-10-CM | POA: Diagnosis not present

## 2015-09-21 DIAGNOSIS — M75122 Complete rotator cuff tear or rupture of left shoulder, not specified as traumatic: Secondary | ICD-10-CM | POA: Diagnosis not present

## 2015-09-24 DIAGNOSIS — M75122 Complete rotator cuff tear or rupture of left shoulder, not specified as traumatic: Secondary | ICD-10-CM | POA: Diagnosis not present

## 2015-09-24 DIAGNOSIS — M7542 Impingement syndrome of left shoulder: Secondary | ICD-10-CM | POA: Diagnosis not present

## 2015-09-24 DIAGNOSIS — R531 Weakness: Secondary | ICD-10-CM | POA: Diagnosis not present

## 2015-09-25 DIAGNOSIS — M75122 Complete rotator cuff tear or rupture of left shoulder, not specified as traumatic: Secondary | ICD-10-CM | POA: Diagnosis not present

## 2015-09-28 DIAGNOSIS — M75122 Complete rotator cuff tear or rupture of left shoulder, not specified as traumatic: Secondary | ICD-10-CM | POA: Diagnosis not present

## 2015-09-28 DIAGNOSIS — M7542 Impingement syndrome of left shoulder: Secondary | ICD-10-CM | POA: Diagnosis not present

## 2015-09-28 DIAGNOSIS — R531 Weakness: Secondary | ICD-10-CM | POA: Diagnosis not present

## 2015-10-05 DIAGNOSIS — M75122 Complete rotator cuff tear or rupture of left shoulder, not specified as traumatic: Secondary | ICD-10-CM | POA: Diagnosis not present

## 2015-10-05 DIAGNOSIS — R531 Weakness: Secondary | ICD-10-CM | POA: Diagnosis not present

## 2015-10-05 DIAGNOSIS — M7542 Impingement syndrome of left shoulder: Secondary | ICD-10-CM | POA: Diagnosis not present

## 2015-10-08 DIAGNOSIS — R531 Weakness: Secondary | ICD-10-CM | POA: Diagnosis not present

## 2015-10-08 DIAGNOSIS — M7542 Impingement syndrome of left shoulder: Secondary | ICD-10-CM | POA: Diagnosis not present

## 2015-10-08 DIAGNOSIS — M75122 Complete rotator cuff tear or rupture of left shoulder, not specified as traumatic: Secondary | ICD-10-CM | POA: Diagnosis not present

## 2015-10-12 DIAGNOSIS — R531 Weakness: Secondary | ICD-10-CM | POA: Diagnosis not present

## 2015-10-12 DIAGNOSIS — M7542 Impingement syndrome of left shoulder: Secondary | ICD-10-CM | POA: Diagnosis not present

## 2015-10-12 DIAGNOSIS — H52203 Unspecified astigmatism, bilateral: Secondary | ICD-10-CM | POA: Diagnosis not present

## 2015-10-12 DIAGNOSIS — M75122 Complete rotator cuff tear or rupture of left shoulder, not specified as traumatic: Secondary | ICD-10-CM | POA: Diagnosis not present

## 2015-10-12 DIAGNOSIS — H5213 Myopia, bilateral: Secondary | ICD-10-CM | POA: Diagnosis not present

## 2015-10-12 DIAGNOSIS — H40013 Open angle with borderline findings, low risk, bilateral: Secondary | ICD-10-CM | POA: Diagnosis not present

## 2015-10-13 ENCOUNTER — Ambulatory Visit: Payer: BLUE CROSS/BLUE SHIELD | Admitting: Adult Health

## 2015-10-15 DIAGNOSIS — M75122 Complete rotator cuff tear or rupture of left shoulder, not specified as traumatic: Secondary | ICD-10-CM | POA: Diagnosis not present

## 2015-10-15 DIAGNOSIS — R531 Weakness: Secondary | ICD-10-CM | POA: Diagnosis not present

## 2015-10-15 DIAGNOSIS — M7542 Impingement syndrome of left shoulder: Secondary | ICD-10-CM | POA: Diagnosis not present

## 2015-10-19 DIAGNOSIS — R531 Weakness: Secondary | ICD-10-CM | POA: Diagnosis not present

## 2015-10-19 DIAGNOSIS — M75122 Complete rotator cuff tear or rupture of left shoulder, not specified as traumatic: Secondary | ICD-10-CM | POA: Diagnosis not present

## 2015-10-19 DIAGNOSIS — M7542 Impingement syndrome of left shoulder: Secondary | ICD-10-CM | POA: Diagnosis not present

## 2015-10-22 DIAGNOSIS — R531 Weakness: Secondary | ICD-10-CM | POA: Diagnosis not present

## 2015-10-22 DIAGNOSIS — M7542 Impingement syndrome of left shoulder: Secondary | ICD-10-CM | POA: Diagnosis not present

## 2015-10-22 DIAGNOSIS — M75122 Complete rotator cuff tear or rupture of left shoulder, not specified as traumatic: Secondary | ICD-10-CM | POA: Diagnosis not present

## 2015-10-27 DIAGNOSIS — R531 Weakness: Secondary | ICD-10-CM | POA: Diagnosis not present

## 2015-10-27 DIAGNOSIS — M7542 Impingement syndrome of left shoulder: Secondary | ICD-10-CM | POA: Diagnosis not present

## 2015-10-27 DIAGNOSIS — M75122 Complete rotator cuff tear or rupture of left shoulder, not specified as traumatic: Secondary | ICD-10-CM | POA: Diagnosis not present

## 2015-10-28 ENCOUNTER — Other Ambulatory Visit: Payer: Self-pay | Admitting: Gastroenterology

## 2015-10-29 ENCOUNTER — Other Ambulatory Visit: Payer: Self-pay | Admitting: Internal Medicine

## 2015-10-29 DIAGNOSIS — M75122 Complete rotator cuff tear or rupture of left shoulder, not specified as traumatic: Secondary | ICD-10-CM | POA: Diagnosis not present

## 2015-10-29 DIAGNOSIS — M7542 Impingement syndrome of left shoulder: Secondary | ICD-10-CM | POA: Diagnosis not present

## 2015-10-29 DIAGNOSIS — R531 Weakness: Secondary | ICD-10-CM | POA: Diagnosis not present

## 2015-11-02 DIAGNOSIS — M75122 Complete rotator cuff tear or rupture of left shoulder, not specified as traumatic: Secondary | ICD-10-CM | POA: Diagnosis not present

## 2015-11-02 DIAGNOSIS — M7542 Impingement syndrome of left shoulder: Secondary | ICD-10-CM | POA: Diagnosis not present

## 2015-11-02 DIAGNOSIS — R531 Weakness: Secondary | ICD-10-CM | POA: Diagnosis not present

## 2015-11-05 DIAGNOSIS — M7542 Impingement syndrome of left shoulder: Secondary | ICD-10-CM | POA: Diagnosis not present

## 2015-11-05 DIAGNOSIS — M75122 Complete rotator cuff tear or rupture of left shoulder, not specified as traumatic: Secondary | ICD-10-CM | POA: Diagnosis not present

## 2015-11-05 DIAGNOSIS — R531 Weakness: Secondary | ICD-10-CM | POA: Diagnosis not present

## 2015-11-06 DIAGNOSIS — M75122 Complete rotator cuff tear or rupture of left shoulder, not specified as traumatic: Secondary | ICD-10-CM | POA: Diagnosis not present

## 2015-11-07 ENCOUNTER — Other Ambulatory Visit: Payer: Self-pay | Admitting: Adult Health

## 2015-11-08 ENCOUNTER — Other Ambulatory Visit: Payer: Self-pay | Admitting: Adult Health

## 2015-11-08 MED ORDER — SPIRONOLACTONE 25 MG PO TABS: 25.0000 mg | ORAL_TABLET | Freq: Every day | ORAL | 0 refills | Status: DC

## 2015-11-09 DIAGNOSIS — M75122 Complete rotator cuff tear or rupture of left shoulder, not specified as traumatic: Secondary | ICD-10-CM | POA: Diagnosis not present

## 2015-11-09 DIAGNOSIS — R531 Weakness: Secondary | ICD-10-CM | POA: Diagnosis not present

## 2015-11-09 DIAGNOSIS — M7542 Impingement syndrome of left shoulder: Secondary | ICD-10-CM | POA: Diagnosis not present

## 2015-11-12 DIAGNOSIS — M75122 Complete rotator cuff tear or rupture of left shoulder, not specified as traumatic: Secondary | ICD-10-CM | POA: Diagnosis not present

## 2015-11-12 DIAGNOSIS — M7542 Impingement syndrome of left shoulder: Secondary | ICD-10-CM | POA: Diagnosis not present

## 2015-11-12 DIAGNOSIS — R531 Weakness: Secondary | ICD-10-CM | POA: Diagnosis not present

## 2015-11-16 DIAGNOSIS — M7542 Impingement syndrome of left shoulder: Secondary | ICD-10-CM | POA: Diagnosis not present

## 2015-11-16 DIAGNOSIS — M75122 Complete rotator cuff tear or rupture of left shoulder, not specified as traumatic: Secondary | ICD-10-CM | POA: Diagnosis not present

## 2015-11-16 DIAGNOSIS — R531 Weakness: Secondary | ICD-10-CM | POA: Diagnosis not present

## 2015-11-25 ENCOUNTER — Other Ambulatory Visit: Payer: Self-pay | Admitting: Gastroenterology

## 2015-11-26 DIAGNOSIS — M7542 Impingement syndrome of left shoulder: Secondary | ICD-10-CM | POA: Diagnosis not present

## 2015-11-26 DIAGNOSIS — R531 Weakness: Secondary | ICD-10-CM | POA: Diagnosis not present

## 2015-11-26 DIAGNOSIS — M75122 Complete rotator cuff tear or rupture of left shoulder, not specified as traumatic: Secondary | ICD-10-CM | POA: Diagnosis not present

## 2015-11-30 DIAGNOSIS — R531 Weakness: Secondary | ICD-10-CM | POA: Diagnosis not present

## 2015-11-30 DIAGNOSIS — M7542 Impingement syndrome of left shoulder: Secondary | ICD-10-CM | POA: Diagnosis not present

## 2015-11-30 DIAGNOSIS — M75122 Complete rotator cuff tear or rupture of left shoulder, not specified as traumatic: Secondary | ICD-10-CM | POA: Diagnosis not present

## 2015-12-03 DIAGNOSIS — M75122 Complete rotator cuff tear or rupture of left shoulder, not specified as traumatic: Secondary | ICD-10-CM | POA: Diagnosis not present

## 2015-12-03 DIAGNOSIS — R531 Weakness: Secondary | ICD-10-CM | POA: Diagnosis not present

## 2015-12-03 DIAGNOSIS — M7542 Impingement syndrome of left shoulder: Secondary | ICD-10-CM | POA: Diagnosis not present

## 2015-12-08 DIAGNOSIS — M7542 Impingement syndrome of left shoulder: Secondary | ICD-10-CM | POA: Diagnosis not present

## 2015-12-08 DIAGNOSIS — M75122 Complete rotator cuff tear or rupture of left shoulder, not specified as traumatic: Secondary | ICD-10-CM | POA: Diagnosis not present

## 2015-12-08 DIAGNOSIS — R531 Weakness: Secondary | ICD-10-CM | POA: Diagnosis not present

## 2015-12-10 DIAGNOSIS — M7542 Impingement syndrome of left shoulder: Secondary | ICD-10-CM | POA: Diagnosis not present

## 2015-12-10 DIAGNOSIS — M75122 Complete rotator cuff tear or rupture of left shoulder, not specified as traumatic: Secondary | ICD-10-CM | POA: Diagnosis not present

## 2015-12-10 DIAGNOSIS — R531 Weakness: Secondary | ICD-10-CM | POA: Diagnosis not present

## 2015-12-15 DIAGNOSIS — M7542 Impingement syndrome of left shoulder: Secondary | ICD-10-CM | POA: Diagnosis not present

## 2015-12-15 DIAGNOSIS — M75122 Complete rotator cuff tear or rupture of left shoulder, not specified as traumatic: Secondary | ICD-10-CM | POA: Diagnosis not present

## 2015-12-15 DIAGNOSIS — R531 Weakness: Secondary | ICD-10-CM | POA: Diagnosis not present

## 2015-12-18 DIAGNOSIS — M75122 Complete rotator cuff tear or rupture of left shoulder, not specified as traumatic: Secondary | ICD-10-CM | POA: Diagnosis not present

## 2015-12-19 ENCOUNTER — Other Ambulatory Visit: Payer: Self-pay | Admitting: Adult Health

## 2015-12-22 DIAGNOSIS — R531 Weakness: Secondary | ICD-10-CM | POA: Diagnosis not present

## 2015-12-22 DIAGNOSIS — M75122 Complete rotator cuff tear or rupture of left shoulder, not specified as traumatic: Secondary | ICD-10-CM | POA: Diagnosis not present

## 2015-12-22 DIAGNOSIS — M7542 Impingement syndrome of left shoulder: Secondary | ICD-10-CM | POA: Diagnosis not present

## 2015-12-24 DIAGNOSIS — R531 Weakness: Secondary | ICD-10-CM | POA: Diagnosis not present

## 2015-12-24 DIAGNOSIS — M75122 Complete rotator cuff tear or rupture of left shoulder, not specified as traumatic: Secondary | ICD-10-CM | POA: Diagnosis not present

## 2015-12-24 DIAGNOSIS — M7542 Impingement syndrome of left shoulder: Secondary | ICD-10-CM | POA: Diagnosis not present

## 2016-01-04 ENCOUNTER — Other Ambulatory Visit: Payer: Self-pay | Admitting: Gastroenterology

## 2016-01-07 ENCOUNTER — Ambulatory Visit: Payer: BLUE CROSS/BLUE SHIELD | Admitting: Adult Health

## 2016-01-08 DIAGNOSIS — M75122 Complete rotator cuff tear or rupture of left shoulder, not specified as traumatic: Secondary | ICD-10-CM | POA: Diagnosis not present

## 2016-01-13 ENCOUNTER — Other Ambulatory Visit: Payer: Self-pay | Admitting: Gastroenterology

## 2016-02-25 ENCOUNTER — Encounter: Payer: Self-pay | Admitting: Adult Health

## 2016-02-25 ENCOUNTER — Ambulatory Visit (INDEPENDENT_AMBULATORY_CARE_PROVIDER_SITE_OTHER): Payer: BLUE CROSS/BLUE SHIELD | Admitting: Adult Health

## 2016-02-25 VITALS — BP 132/80 | Temp 98.6°F | Wt 213.0 lb

## 2016-02-25 DIAGNOSIS — R630 Anorexia: Secondary | ICD-10-CM

## 2016-02-25 DIAGNOSIS — Z23 Encounter for immunization: Secondary | ICD-10-CM | POA: Diagnosis not present

## 2016-02-25 LAB — BASIC METABOLIC PANEL
BUN: 9 mg/dL (ref 6–23)
CO2: 31 meq/L (ref 19–32)
Calcium: 9.4 mg/dL (ref 8.4–10.5)
Chloride: 104 mEq/L (ref 96–112)
Creatinine, Ser: 1.12 mg/dL (ref 0.40–1.50)
GFR: 90.45 mL/min (ref >60.00)
GLUCOSE: 84 mg/dL (ref 70–99)
POTASSIUM: 3.9 meq/L (ref 3.5–5.1)
Sodium: 141 mEq/L (ref 135–145)

## 2016-02-25 LAB — CBC WITH DIFFERENTIAL/PLATELET
BASOS PCT: 0.2 % (ref 0.0–3.0)
Basophils Absolute: 0 10*3/uL (ref 0.0–0.1)
EOS PCT: 0.9 % (ref 0.0–5.0)
Eosinophils Absolute: 0.1 10*3/uL (ref 0.0–0.7)
HCT: 44.8 % (ref 39.0–52.0)
HEMOGLOBIN: 14.8 g/dL (ref 13.0–17.0)
LYMPHS ABS: 2.6 10*3/uL (ref 0.7–4.0)
Lymphocytes Relative: 42.9 % (ref 12.0–46.0)
MCHC: 33.1 g/dL (ref 30.0–36.0)
MCV: 90.1 fl (ref 78.0–100.0)
MONO ABS: 0.5 10*3/uL (ref 0.1–1.0)
Monocytes Relative: 8.6 % (ref 3.0–12.0)
NEUTROS PCT: 47.4 % (ref 43.0–77.0)
Neutro Abs: 2.8 10*3/uL (ref 1.4–7.7)
Platelets: 248 10*3/uL (ref 150.0–400.0)
RBC: 4.97 Mil/uL (ref 4.22–5.81)
RDW: 14.6 % (ref 11.5–15.5)
WBC: 6 10*3/uL (ref 4.0–10.5)

## 2016-02-25 LAB — POC URINALSYSI DIPSTICK (AUTOMATED)
Bilirubin, UA: NEGATIVE
Blood, UA: NEGATIVE
Glucose, UA: NEGATIVE
KETONES UA: NEGATIVE
Leukocytes, UA: NEGATIVE
Nitrite, UA: NEGATIVE
PH UA: 6
PROTEIN UA: NEGATIVE
SPEC GRAV UA: 1.02
UROBILINOGEN UA: 0.2

## 2016-02-25 LAB — TSH: TSH: 0.64 u[IU]/mL (ref 0.35–4.50)

## 2016-02-25 LAB — HEPATIC FUNCTION PANEL
ALT: 18 U/L (ref 0–53)
AST: 25 U/L (ref 0–37)
Albumin: 4.1 g/dL (ref 3.5–5.2)
Alkaline Phosphatase: 51 U/L (ref 39–117)
BILIRUBIN TOTAL: 1 mg/dL (ref 0.2–1.2)
Bilirubin, Direct: 0.2 mg/dL (ref 0.0–0.3)
Total Protein: 6.6 g/dL (ref 6.0–8.3)

## 2016-02-25 NOTE — Patient Instructions (Signed)
I am going to get some labs today to see if we can find the cause of your loss of appetite.   I will have you go to the [facility] off for a chest x ray   Please work on reducing stress

## 2016-02-25 NOTE — Progress Notes (Signed)
Subjective:    Patient ID: Scott Carson, male    DOB: Jul 13, 1970, 46 y.o.   MRN: [id number]  HPI 46 year old male who presents to the office today with the complaint of loss of appetite x atleast 8 months- on and off. [place] reports that he often does not have an appetite and he has to force himself to eat, states " I could go a week without eating, but I make myself eat." He continues " sometimes I have to smoke weed in order to get an appetite".   He continues to smoke cigarettes and has not been exercising due to recovering from rotator cuff surgery.   He has a lot of stress at work and feels like this may be a contributing factor. Denies any depression  He denies any nausea ( before or after eating), vomiting, fevers, abdominal pain or feeling ill.    Review of Systems  Constitutional: Positive for appetite change.  Respiratory: Negative.   Cardiovascular: Negative.   Gastrointestinal: Negative.   Genitourinary: Negative.   Neurological: Negative.    Past Medical History:  Diagnosis Date  . Arthritis    BACK  . Eczema of hand   . Hypertension   . Low back pain   . Smoker     Social History   Social History  . Marital status: Married    Spouse name: N/A  . Number of children: N/A  . Years of education: N/A   Occupational History  . Not on file.   Social History Main Topics  . Smoking status: Current Some Day Smoker    Packs/day: 0.50    Types: Cigars    Start date: 04/30/2012    Last attempt to quit: 06/21/2013  . Smokeless tobacco: Never Used  . Alcohol use 0.0 oz/week     Comment: occasionaly  . Drug use:     Types: Marijuana     Comment: Quit smoking & marijuana 1 month ago  . Sexual activity: Not on file   Other Topics Concern  . Not on file   Social History Narrative   Married   Alcohol use-no      Current Smoker   Occupation: Architect            Past Surgical History:  Procedure Laterality Date  . RESECTION DISTAL CLAVICAL Left  08/13/2015   Procedure: RESECTION DISTAL CLAVICAL;  Surgeon: Ninetta Lights, MD;  Location: [facility];  Service: Orthopedics;  Laterality: Left;  . SHOULDER ARTHROSCOPY WITH ROTATOR CUFF REPAIR AND SUBACROMIAL DECOMPRESSION Left 08/13/2015   Procedure: LEFT SHOULDER ARTHROSCOPY DEBRIDEMENT ACROMIOPLASTY, DISTAL CLAVICAL EXCISION ROTATOR CUFF REPAIR ;  Surgeon: Ninetta Lights, MD;  Location: [facility];  Service: Orthopedics;  Laterality: Left;  . TONSILLECTOMY    . UPPER GASTROINTESTINAL ENDOSCOPY      Family History  Problem Relation Age of Onset  . Hypertension Mother   . Diabetes Maternal Grandmother   . Stroke Maternal Grandmother   . Other Neg Hx     no family history of colon cancer  . Stomach cancer Neg Hx   . Esophageal cancer Neg Hx   . Prostate cancer Neg Hx   . Colon cancer Paternal Grandfather     No Known Allergies  Current Outpatient Prescriptions on File Prior to Visit  Medication Sig Dispense Refill  . buPROPion (WELLBUTRIN SR) 150 MG 12 hr tablet Take 1 tablet (150 mg total) by mouth 2 (two) times daily.  60 tablet 3  . labetalol (NORMODYNE) 300 MG tablet TAKE 1 TABLET BY MOUTH TWICE A DAY 180 tablet 1  . omeprazole (PRILOSEC) 20 MG capsule TAKE ONE CAPSULE BY MOUTH TWICE A DAY BEFORE A MEAL 60 capsule 0  . ondansetron (ZOFRAN) 4 MG tablet Take 1 tablet (4 mg total) by mouth every 8 (eight) hours as needed for nausea or vomiting. 40 tablet 0  . oxyCODONE-acetaminophen (ROXICET) 5-325 MG tablet Take 1-2 tablets by mouth every 4 (four) hours as needed. 60 tablet 0  . spironolactone (ALDACTONE) 25 MG tablet Take 1 tablet (25 mg total) by mouth daily. 90 tablet 0  . triamcinolone cream (KENALOG) 0.1 % Reported on 07/14/2015  2  . valsartan-hydrochlorothiazide (DIOVAN-HCT) 320-25 MG tablet TAKE 1 TABLET BY MOUTH DAILY. 90 tablet 0  . varenicline (CHANTIX CONTINUING MONTH PAK) 1 MG tablet Take 1 tablet (1 mg total) by mouth 2 (two) times daily.  60 tablet 2   No current facility-administered medications on file prior to visit.     BP 132/80   Temp 98.6 F (37 C) (Oral)   Wt 213 lb (96.6 kg)   BMI 31.45 kg/m       Objective:   Physical Exam  Constitutional: He is oriented to person, place, and time. He appears well-developed and well-nourished. No distress.  Cardiovascular: Normal rate, regular rhythm, normal heart sounds and intact distal pulses.  Exam reveals no gallop and no friction rub.   No murmur heard. Pulmonary/Chest: Effort normal and breath sounds normal. No respiratory distress. He has no wheezes. He has no rales. He exhibits no tenderness.  Abdominal: Soft. Bowel sounds are normal. He exhibits no distension and no mass. There is no tenderness. There is no rebound and no guarding.  Musculoskeletal: Normal range of motion. He exhibits no edema, tenderness or deformity.  Neurological: He is alert and oriented to person, place, and time.  Skin: Skin is warm and dry. No rash noted. He is not diaphoretic. No erythema. No pallor.  Psychiatric: He has a normal mood and affect. His behavior is normal. Judgment and thought content normal.  Nursing note and vitals reviewed.     Assessment & Plan:  1. Loss of appetite - He has actually gained weight .  Wt Readings from Last 3 Encounters:  02/25/16 213 lb (96.6 kg)  08/13/15 203 lb (92.1 kg)  07/14/15 211 lb 3.2 oz (95.8 kg)  - His last endoscopy was in 2015 for Schatzki's ring dilated 2012 and 2013; repeat dysphagia.  - Will look for organic causes of his loss of appetite. Possibly stress reaction.  - He needs to quit smoking and start exercising - Consider referral to GI - Basic metabolic panel - CBC with Differential/Platelet - Hepatic function panel - POCT Urinalysis Dipstick (Automated) - TSH - HIV antibody - DG Chest 2 View; Future  2. Need for prophylactic vaccination and inoculation against influenza - Flu Vaccine QUAD 36+ mos PF IM (Fluarix & Fluzone  Quad PF)  Current Outpatient Prescriptions on File Prior to Visit  Medication Sig Dispense Refill  . buPROPion (WELLBUTRIN SR) 150 MG 12 hr tablet Take 1 tablet (150 mg total) by mouth 2 (two) times daily. 60 tablet 3  . labetalol (NORMODYNE) 300 MG tablet TAKE 1 TABLET BY MOUTH TWICE A DAY 180 tablet 1  . omeprazole (PRILOSEC) 20 MG capsule TAKE ONE CAPSULE BY MOUTH TWICE A DAY BEFORE A MEAL 60 capsule 0  . ondansetron (ZOFRAN) 4 MG tablet  Take 1 tablet (4 mg total) by mouth every 8 (eight) hours as needed for nausea or vomiting. 40 tablet 0  . oxyCODONE-acetaminophen (ROXICET) 5-325 MG tablet Take 1-2 tablets by mouth every 4 (four) hours as needed. 60 tablet 0  . spironolactone (ALDACTONE) 25 MG tablet Take 1 tablet (25 mg total) by mouth daily. 90 tablet 0  . triamcinolone cream (KENALOG) 0.1 % Reported on 07/14/2015  2  . valsartan-hydrochlorothiazide (DIOVAN-HCT) 320-25 MG tablet TAKE 1 TABLET BY MOUTH DAILY. 90 tablet 0  . varenicline (CHANTIX CONTINUING MONTH PAK) 1 MG tablet Take 1 tablet (1 mg total) by mouth 2 (two) times daily. 60 tablet 2   No current facility-administered medications on file prior to visit.

## 2016-02-26 LAB — HIV ANTIBODY (ROUTINE TESTING W REFLEX): HIV: NONREACTIVE

## 2016-03-12 ENCOUNTER — Other Ambulatory Visit: Payer: Self-pay | Admitting: Adult Health

## 2016-03-14 NOTE — Telephone Encounter (Signed)
Ok to refill for one year  

## 2016-04-20 DIAGNOSIS — H538 Other visual disturbances: Secondary | ICD-10-CM | POA: Diagnosis not present

## 2016-04-20 DIAGNOSIS — H40013 Open angle with borderline findings, low risk, bilateral: Secondary | ICD-10-CM | POA: Diagnosis not present

## 2016-05-03 ENCOUNTER — Encounter: Payer: BLUE CROSS/BLUE SHIELD | Admitting: Adult Health

## 2016-05-19 ENCOUNTER — Encounter: Payer: BLUE CROSS/BLUE SHIELD | Admitting: Adult Health

## 2016-05-26 ENCOUNTER — Encounter: Payer: Self-pay | Admitting: Adult Health

## 2016-05-26 ENCOUNTER — Ambulatory Visit (INDEPENDENT_AMBULATORY_CARE_PROVIDER_SITE_OTHER): Payer: BLUE CROSS/BLUE SHIELD | Admitting: Adult Health

## 2016-05-26 VITALS — BP 124/68 | Temp 98.2°F | Ht 69.0 in | Wt 207.8 lb

## 2016-05-26 DIAGNOSIS — F17200 Nicotine dependence, unspecified, uncomplicated: Secondary | ICD-10-CM

## 2016-05-26 DIAGNOSIS — I10 Essential (primary) hypertension: Secondary | ICD-10-CM | POA: Diagnosis not present

## 2016-05-26 DIAGNOSIS — Z0000 Encounter for general adult medical examination without abnormal findings: Secondary | ICD-10-CM | POA: Diagnosis not present

## 2016-05-26 LAB — BASIC METABOLIC PANEL
BUN: 12 mg/dL (ref 6–23)
CALCIUM: 9.5 mg/dL (ref 8.4–10.5)
CO2: 30 meq/L (ref 19–32)
CREATININE: 1.06 mg/dL (ref 0.40–1.50)
Chloride: 107 mEq/L (ref 96–112)
GFR: 96.28 mL/min (ref >60.00)
Glucose, Bld: 90 mg/dL (ref 70–99)
Potassium: 3.9 mEq/L (ref 3.5–5.1)
SODIUM: 140 meq/L (ref 135–145)

## 2016-05-26 LAB — CBC WITH DIFFERENTIAL/PLATELET
BASOS ABS: 0 10*3/uL (ref 0.0–0.1)
Basophils Relative: 0.2 % (ref 0.0–3.0)
EOS ABS: 0.1 10*3/uL (ref 0.0–0.7)
Eosinophils Relative: 1.2 % (ref 0.0–5.0)
HEMATOCRIT: 46.4 % (ref 39.0–52.0)
Hemoglobin: 15.8 g/dL (ref 13.0–17.0)
LYMPHS PCT: 46.6 % — AB (ref 12.0–46.0)
Lymphs Abs: 2.4 10*3/uL (ref 0.7–4.0)
MCHC: 34.1 g/dL (ref 30.0–36.0)
MCV: 90.2 fl (ref 78.0–100.0)
MONOS PCT: 8.2 % (ref 3.0–12.0)
Monocytes Absolute: 0.4 10*3/uL (ref 0.1–1.0)
Neutro Abs: 2.2 10*3/uL (ref 1.4–7.7)
Neutrophils Relative %: 43.8 % (ref 43.0–77.0)
PLATELETS: 255 10*3/uL (ref 150.0–400.0)
RBC: 5.14 Mil/uL (ref 4.22–5.81)
RDW: 13.9 % (ref 11.5–15.5)
WBC: 5.1 10*3/uL (ref 4.0–10.5)

## 2016-05-26 LAB — HEPATIC FUNCTION PANEL
ALBUMIN: 4.4 g/dL (ref 3.5–5.2)
ALT: 16 U/L (ref 0–53)
AST: 22 U/L (ref 0–37)
Alkaline Phosphatase: 55 U/L (ref 39–117)
BILIRUBIN DIRECT: 0.2 mg/dL (ref 0.0–0.3)
TOTAL PROTEIN: 7 g/dL (ref 6.0–8.3)
Total Bilirubin: 1 mg/dL (ref 0.2–1.2)

## 2016-05-26 LAB — PSA: PSA: 2 ng/mL (ref 0.10–4.00)

## 2016-05-26 LAB — LIPID PANEL
Cholesterol: 134 mg/dL (ref 0–200)
HDL: 43.1 mg/dL (ref >39.00)
LDL Cholesterol: 81 mg/dL (ref 0–99)
NonHDL: 91.06
Total CHOL/HDL Ratio: 3
Triglycerides: 52 mg/dL (ref 0.0–149.0)
VLDL: 10.4 mg/dL (ref 0.0–40.0)

## 2016-05-26 LAB — TSH: TSH: 0.58 u[IU]/mL (ref 0.35–4.50)

## 2016-05-26 MED ORDER — VARENICLINE TARTRATE 1 MG PO TABS: 1.0000 mg | ORAL_TABLET | Freq: Two times a day (BID) | ORAL | 2 refills | Status: DC

## 2016-05-26 MED ORDER — VARENICLINE TARTRATE 0.5 MG X 11 & 1 MG X 42 PO MISC: ORAL | 0 refills | Status: DC

## 2016-05-26 MED ORDER — VALSARTAN-HYDROCHLOROTHIAZIDE 320-25 MG PO TABS: 1.0000 | ORAL_TABLET | Freq: Every day | ORAL | 3 refills | Status: DC

## 2016-05-26 NOTE — Progress Notes (Signed)
Subjective:    Patient ID: Scott Carson, male    DOB: [birth year], 46 y.o.   MRN: [id number]  HPI  Patient presents for yearly preventative medicine examination. He is a pleasant 46 year old male who  has a past medical history of Arthritis; Eczema of hand; Hypertension; Low back pain; and Smoker.  All immunizations and health maintenance protocols were reviewed with the patient and needed orders were placed.  Appropriate screening laboratory values were ordered for the patient including screening of hyperlipidemia, renal function and hepatic function. If indicated by BPH, a PSA was ordered.  Medication reconciliation,  past medical history, social history, problem list and allergies were reviewed in detail with the patient  Goals were established with regard to weight loss, exercise, and  diet in compliance with medications  Tobacco Use- is smoking 2-5 cigars per day. He wants to quit. Would like to try Chantix again. Currently taking Wellbutrin but does not find it useful.   Hypertension - He takes Diovan and Labetaolol for hypertension   Review of Systems  Constitutional: Negative.   HENT: Negative.   Eyes: Negative.   Respiratory: Negative.   Cardiovascular: Negative.   Gastrointestinal: Negative.   Endocrine: Negative.   Genitourinary: Negative.   Musculoskeletal: Negative.   Skin: Negative.   Allergic/Immunologic: Negative.   Neurological: Negative.   Hematological: Negative.   Psychiatric/Behavioral: Negative.   All other systems reviewed and are negative.  Past Medical History:  Diagnosis Date  . Arthritis    BACK  . Eczema of hand   . Hypertension   . Low back pain   . Smoker     Social History   Social History  . Marital status: Married    Spouse name: N/A  . Number of children: N/A  . Years of education: N/A   Occupational History  . Not on file.   Social History Main Topics  . Smoking status: Current Some Day Smoker    Packs/day: 0.50   Types: Cigars    Start date: 04/30/2012    Last attempt to quit: 06/21/2013  . Smokeless tobacco: Never Used  . Alcohol use 0.0 oz/week     Comment: occasionaly  . Drug use: Yes    Types: Marijuana     Comment: Quit smoking & marijuana 1 month ago  . Sexual activity: Not on file   Other Topics Concern  . Not on file   Social History Narrative   Married   Alcohol use-no      Current Smoker   Occupation: Architect            Past Surgical History:  Procedure Laterality Date  . RESECTION DISTAL CLAVICAL Left 08/13/2015   Procedure: RESECTION DISTAL CLAVICAL;  Surgeon: Ninetta Lights, MD;  Location: [place];  Service: Orthopedics;  Laterality: Left;  . SHOULDER ARTHROSCOPY WITH ROTATOR CUFF REPAIR AND SUBACROMIAL DECOMPRESSION Left 08/13/2015   Procedure: LEFT SHOULDER ARTHROSCOPY DEBRIDEMENT ACROMIOPLASTY, DISTAL CLAVICAL EXCISION ROTATOR CUFF REPAIR ;  Surgeon: Ninetta Lights, MD;  Location: [place];  Service: Orthopedics;  Laterality: Left;  . TONSILLECTOMY    . UPPER GASTROINTESTINAL ENDOSCOPY      Family History  Problem Relation Age of Onset  . Hypertension Mother   . Diabetes Maternal Grandmother   . Stroke Maternal Grandmother   . Other Neg Hx     no family history of colon cancer  . Stomach cancer Neg Hx   . Esophageal cancer  Neg Hx   . Prostate cancer Neg Hx   . Colon cancer Paternal Grandfather     No Known Allergies  Current Outpatient Prescriptions on File Prior to Visit  Medication Sig Dispense Refill  . buPROPion (WELLBUTRIN SR) 150 MG 12 hr tablet Take 1 tablet (150 mg total) by mouth 2 (two) times daily. 60 tablet 3  . labetalol (NORMODYNE) 300 MG tablet TAKE 1 TABLET BY MOUTH TWICE A DAY 180 tablet 3  . omeprazole (PRILOSEC) 20 MG capsule TAKE ONE CAPSULE BY MOUTH TWICE A DAY BEFORE A MEAL 60 capsule 0  . ondansetron (ZOFRAN) 4 MG tablet Take 1 tablet (4 mg total) by mouth every 8 (eight) hours as needed for nausea  or vomiting. 40 tablet 0  . oxyCODONE-acetaminophen (ROXICET) 5-325 MG tablet Take 1-2 tablets by mouth every 4 (four) hours as needed. 60 tablet 0  . spironolactone (ALDACTONE) 25 MG tablet Take 1 tablet (25 mg total) by mouth daily. 90 tablet 0  . triamcinolone cream (KENALOG) 0.1 % Reported on 07/14/2015  2  . valsartan-hydrochlorothiazide (DIOVAN-HCT) 320-25 MG tablet TAKE 1 TABLET BY MOUTH DAILY. 90 tablet 0  . varenicline (CHANTIX CONTINUING MONTH PAK) 1 MG tablet Take 1 tablet (1 mg total) by mouth 2 (two) times daily. 60 tablet 2   No current facility-administered medications on file prior to visit.     There were no vitals taken for this visit.      Objective:   Physical Exam  Constitutional: He is oriented to person, place, and time. He appears well-developed and well-nourished. No distress.  HENT:  Head: Normocephalic and atraumatic.  Right Ear: External ear normal.  Left Ear: External ear normal.  Nose: Nose normal.  Mouth/Throat: Oropharynx is clear and moist and mucous membranes are normal. No oropharyngeal exudate.  Eyes: Conjunctivae and EOM are normal. Pupils are equal, round, and reactive to light. Right eye exhibits no discharge. Left eye exhibits no discharge. No scleral icterus.  Neck: Trachea normal and normal range of motion. Neck supple. No JVD present. No tracheal tenderness present. Carotid bruit is not present. No tracheal deviation present. No thyroid mass and no thyromegaly present.  Cardiovascular: Normal rate, regular rhythm, normal heart sounds and intact distal pulses.  Exam reveals no gallop and no friction rub.   No murmur heard. Pulmonary/Chest: Effort normal and breath sounds normal. No stridor. No respiratory distress. He has no wheezes. He has no rales. He exhibits no tenderness.  Abdominal: Soft. Bowel sounds are normal. He exhibits no distension and no mass. There is no tenderness. There is no rebound and no guarding.  Genitourinary: Rectum normal  and prostate normal. Rectal exam shows guaiac negative stool.  Musculoskeletal: Normal range of motion. He exhibits no edema, tenderness or deformity.  Lymphadenopathy:    He has no cervical adenopathy.  Neurological: He is alert and oriented to person, place, and time. He has normal reflexes. He displays normal reflexes. No cranial nerve deficit. He exhibits normal muscle tone. Coordination normal.  Skin: Skin is warm and dry. No rash noted. He is not diaphoretic. No erythema. No pallor.  Psychiatric: He has a normal mood and affect. His behavior is normal. Judgment and thought content normal.  Nursing note and vitals reviewed.     Assessment & Plan:  1. Routine general medical examination at a health care facility - Needs to quit smoking.  - Work on diet and exercise  - Basic metabolic panel - CBC with Differential/Platelet - Hepatic  function panel - Lipid panel - PSA - TSH  2. Tobacco use disorder - I am gong to have him slowly taper off Wellbutrin  - varenicline (CHANTIX CONTINUING MONTH PAK) 1 MG tablet; Take 1 tablet (1 mg total) by mouth 2 (two) times daily.  Dispense: 60 tablet; Refill: 2 - varenicline (CHANTIX STARTING MONTH PAK) 0.5 MG X 11 & 1 MG X 42 tablet; Take one 0.5 mg tablet by mouth once daily for 3 days, then increase to one 0.5 mg tablet twice daily for 4 days, then increase to one 1 mg tablet twice daily.  Dispense: 53 tablet; Refill: 0 Trial of chantix. Common side effects including rare risk of suicide ideation was discussed with the patient today.  Patient is instructed to go directly to the ED if this occurs.  We discussed that patient can continue to smoke for 2 weeks after starting chantix, but then must discontinue cigarettes.   - Follow up in one month  5 minutes spent with patient today on tobacco cessation counseling.   3. Essential hypertension - Well controlled  - Basic metabolic panel - CBC with Differential/Platelet - Hepatic function panel -  Lipid panel - PSA - TSH - valsartan-hydrochlorothiazide (DIOVAN-HCT) 320-25 MG tablet; Take 1 tablet by mouth daily.  Dispense: 90 tablet; Refill: [place]

## 2016-05-26 NOTE — Patient Instructions (Signed)
It was great seeing you!  Please work on quitting smoking. Use the chantix. There is a savings card on the website if you need it.   Follow up with me in one month

## 2016-06-24 ENCOUNTER — Ambulatory Visit (INDEPENDENT_AMBULATORY_CARE_PROVIDER_SITE_OTHER): Payer: BLUE CROSS/BLUE SHIELD | Admitting: Adult Health

## 2016-06-24 ENCOUNTER — Encounter: Payer: Self-pay | Admitting: Adult Health

## 2016-06-24 VITALS — BP 124/68 | Temp 97.7°F | Ht 69.0 in | Wt 208.2 lb

## 2016-06-24 DIAGNOSIS — Z720 Tobacco use: Secondary | ICD-10-CM

## 2016-06-24 NOTE — Progress Notes (Signed)
Subjective:    Patient ID: Scott Carson, male    DOB: Nov 20, 1970, 46 y.o.   MRN: [id number]  HPI  46 year old male who  has a past medical history of Arthritis; Eczema of hand; Hypertension; Low back pain; and Smoker. he presents to the office today for one month follow up after starting chantix. He is happy to reports that two days after starting Chantix he was able to quit smoking. He continued on Chantix until about three days ago and stopped taking it. He has had minor cravings since but has not started smoking. He has started coughing up brown phlegm but over all states " I am feeling so much better!".    Review of Systems See HPI   Past Medical History:  Diagnosis Date  . Arthritis    BACK  . Eczema of hand   . Hypertension   . Low back pain   . Smoker     Social History   Social History  . Marital status: Married    Spouse name: N/A  . Number of children: N/A  . Years of education: N/A   Occupational History  . Not on file.   Social History Main Topics  . Smoking status: Current Some Day Smoker    Packs/day: 0.50    Types: Cigars    Start date: 04/30/2012    Last attempt to quit: 06/21/2013  . Smokeless tobacco: Never Used  . Alcohol use 0.0 oz/week     Comment: occasionaly  . Drug use: Yes    Types: Marijuana     Comment: Quit smoking & marijuana 1 month ago  . Sexual activity: Not on file   Other Topics Concern  . Not on file   Social History Narrative   Married   Alcohol use-no      Current Smoker   Occupation: Architect            Past Surgical History:  Procedure Laterality Date  . RESECTION DISTAL CLAVICAL Left 08/13/2015   Procedure: RESECTION DISTAL CLAVICAL;  Surgeon: Ninetta Lights, MD;  Location: [place];  Service: Orthopedics;  Laterality: Left;  . SHOULDER ARTHROSCOPY WITH ROTATOR CUFF REPAIR AND SUBACROMIAL DECOMPRESSION Left 08/13/2015   Procedure: LEFT SHOULDER ARTHROSCOPY DEBRIDEMENT ACROMIOPLASTY, DISTAL  CLAVICAL EXCISION ROTATOR CUFF REPAIR ;  Surgeon: Ninetta Lights, MD;  Location: [place];  Service: Orthopedics;  Laterality: Left;  . TONSILLECTOMY    . UPPER GASTROINTESTINAL ENDOSCOPY      Family History  Problem Relation Age of Onset  . Hypertension Mother   . Diabetes Maternal Grandmother   . Stroke Maternal Grandmother   . Other Neg Hx     no family history of colon cancer  . Stomach cancer Neg Hx   . Esophageal cancer Neg Hx   . Prostate cancer Neg Hx   . Colon cancer Paternal Grandfather     No Known Allergies  Current Outpatient Prescriptions on File Prior to Visit  Medication Sig Dispense Refill  . labetalol (NORMODYNE) 300 MG tablet TAKE 1 TABLET BY MOUTH TWICE A DAY 180 tablet 3  . valsartan-hydrochlorothiazide (DIOVAN-HCT) 320-25 MG tablet Take 1 tablet by mouth daily. 90 tablet 3  . varenicline (CHANTIX CONTINUING MONTH PAK) 1 MG tablet Take 1 tablet (1 mg total) by mouth 2 (two) times daily. 60 tablet 2  . varenicline (CHANTIX STARTING MONTH PAK) 0.5 MG X 11 & 1 MG X 42 tablet Take one 0.5  mg tablet by mouth once daily for 3 days, then increase to one 0.5 mg tablet twice daily for 4 days, then increase to one 1 mg tablet twice daily. 53 tablet 0   No current facility-administered medications on file prior to visit.     BP 124/68 (BP Location: Left Arm, Patient Position: Sitting, Cuff Size: Normal)   Temp 97.7 F (36.5 C) (Oral)   Ht 5\' 9"  (1.753 m)   Wt 208 lb 3.2 oz (94.4 kg)   BMI 30.75 kg/m       Objective:   Physical Exam  Constitutional: He is oriented to person, place, and time. He appears well-developed and well-nourished. No distress.  Cardiovascular: Normal rate, regular rhythm, normal heart sounds and intact distal pulses.  Exam reveals no gallop and no friction rub.   No murmur heard. Pulmonary/Chest: Effort normal and breath sounds normal. No respiratory distress. He has no wheezes. He has no rales. He exhibits no tenderness.    Neurological: He is alert and oriented to person, place, and time.  Skin: Skin is warm and dry. No rash noted. He is not diaphoretic. No erythema. No pallor.  Psychiatric: He has a normal mood and affect. His behavior is normal. Judgment and thought content normal.  Nursing note and vitals reviewed.     Assessment & Plan:  1. Tobacco use - I am very happy that he has quit smoking. Advised that he can continue the chantix for up to 3 months and that this may be beneficial for him.  - Follow up as needed  Dorothyann Peng, NP

## 2016-07-10 ENCOUNTER — Other Ambulatory Visit: Payer: Self-pay | Admitting: Internal Medicine

## 2016-07-25 ENCOUNTER — Other Ambulatory Visit: Payer: Self-pay | Admitting: *Deleted

## 2016-07-25 DIAGNOSIS — F17200 Nicotine dependence, unspecified, uncomplicated: Secondary | ICD-10-CM

## 2016-07-25 MED ORDER — VARENICLINE TARTRATE 1 MG PO TABS: 1.0000 mg | ORAL_TABLET | Freq: Two times a day (BID) | ORAL | 2 refills | Status: DC

## 2016-08-05 ENCOUNTER — Other Ambulatory Visit: Payer: Self-pay

## 2016-08-05 ENCOUNTER — Telehealth: Payer: Self-pay | Admitting: Family Medicine

## 2016-08-05 DIAGNOSIS — F17200 Nicotine dependence, unspecified, uncomplicated: Secondary | ICD-10-CM

## 2016-08-05 MED ORDER — VARENICLINE TARTRATE 1 MG PO TABS: 1.0000 mg | ORAL_TABLET | Freq: Two times a day (BID) | ORAL | 1 refills | Status: DC

## 2016-08-05 NOTE — Telephone Encounter (Signed)
Is a 90 day supply ok.  

## 2016-08-05 NOTE — Telephone Encounter (Signed)
Rx has been refilled.  

## 2016-08-05 NOTE — Telephone Encounter (Signed)
Received a fax from [facility].  Pt is requesting a 90 day supply of varenicline (CHANTIX CONTINUING MONTH PAK) 1 MG tablet.  Please send to pharmacy.

## 2016-08-05 NOTE — Telephone Encounter (Signed)
That is fine 

## 2016-08-06 IMAGING — MR MR SHOULDER*L* W/O CM
4 of 5 series · 14 of 40 positions shown · non-contrast
Comparison: None.

CLINICAL DATA: Left shoulder pain and weakness for 1 year. No known
injury. Initial encounter.

EXAM:
MRI OF THE LEFT SHOULDER WITHOUT CONTRAST
TECHNIQUE: Multiplanar, multisequence MR imaging of the shoulder was performed.
No intravenous contrast was administered.

[Series 8: PD · oblique · left · 3.0mm · 0.18mm/px · 5 of 21 slices shown]
[im 1/21]
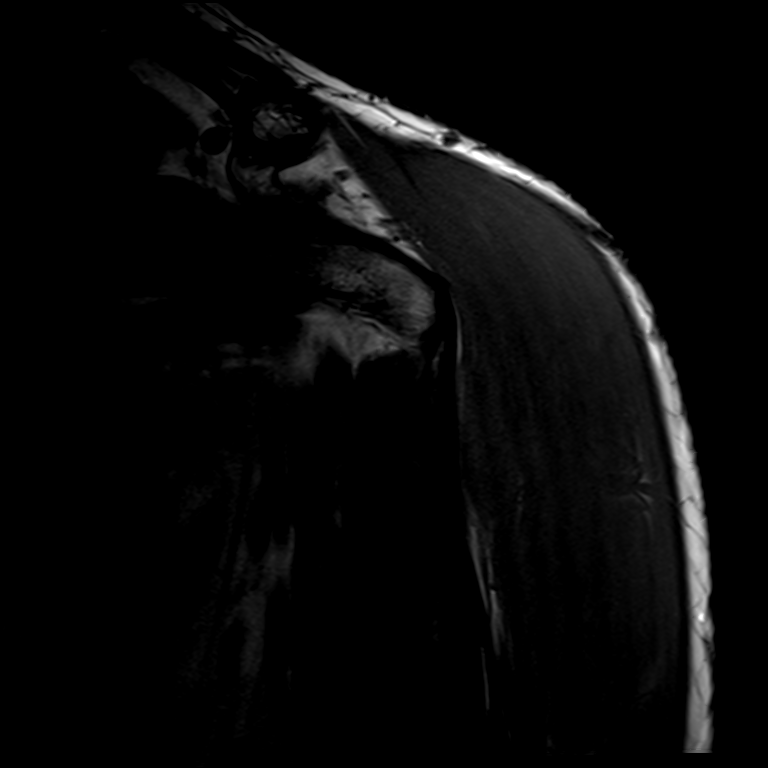
[im 4/21]
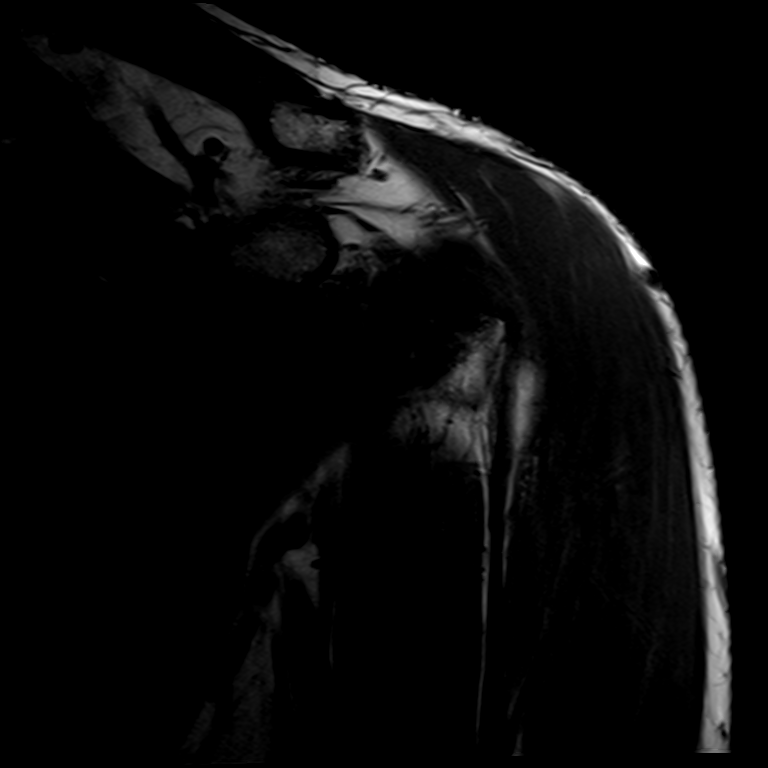
[im 7/21]
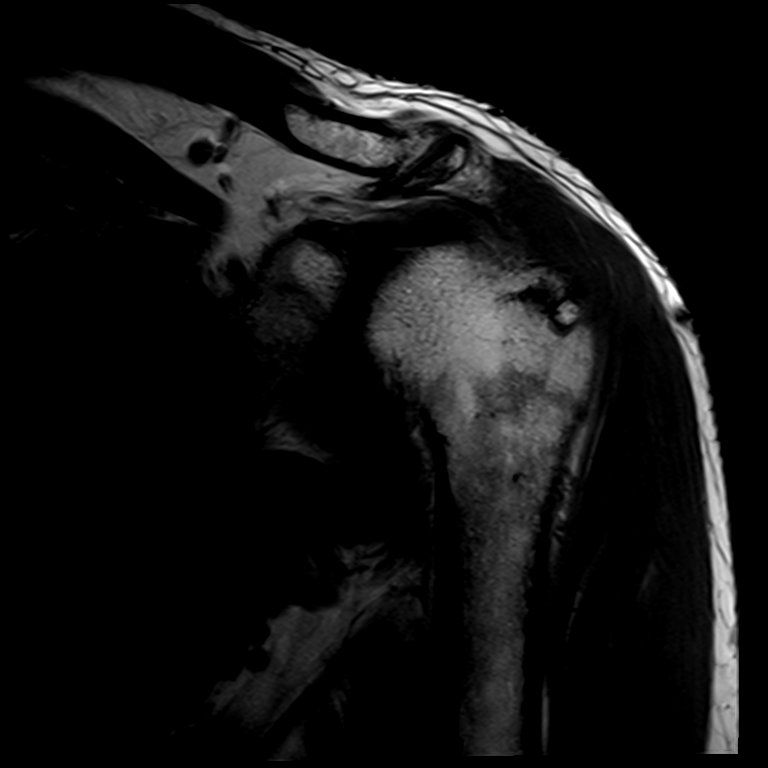
[im 11/21]
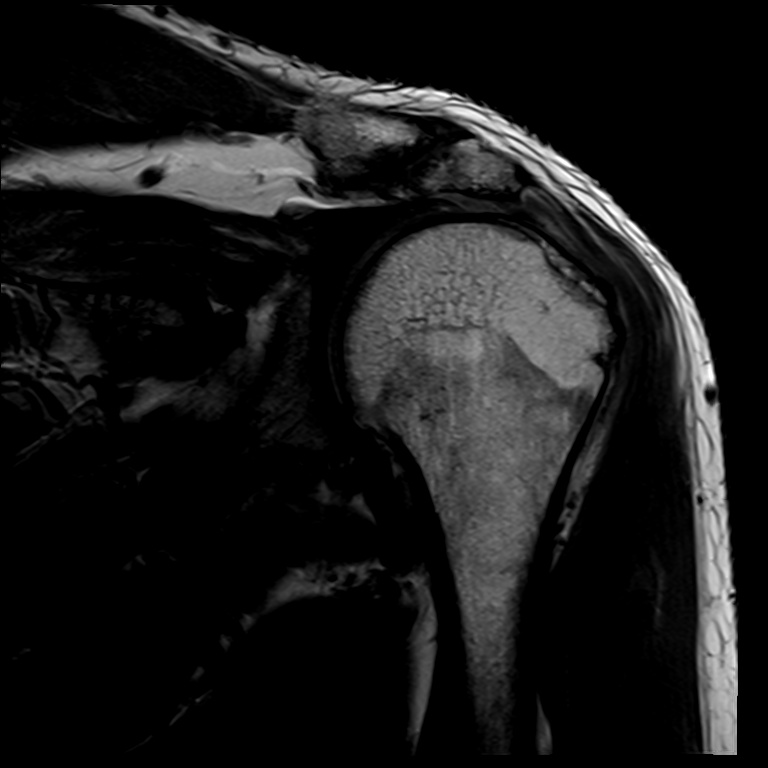
[im 17/21]
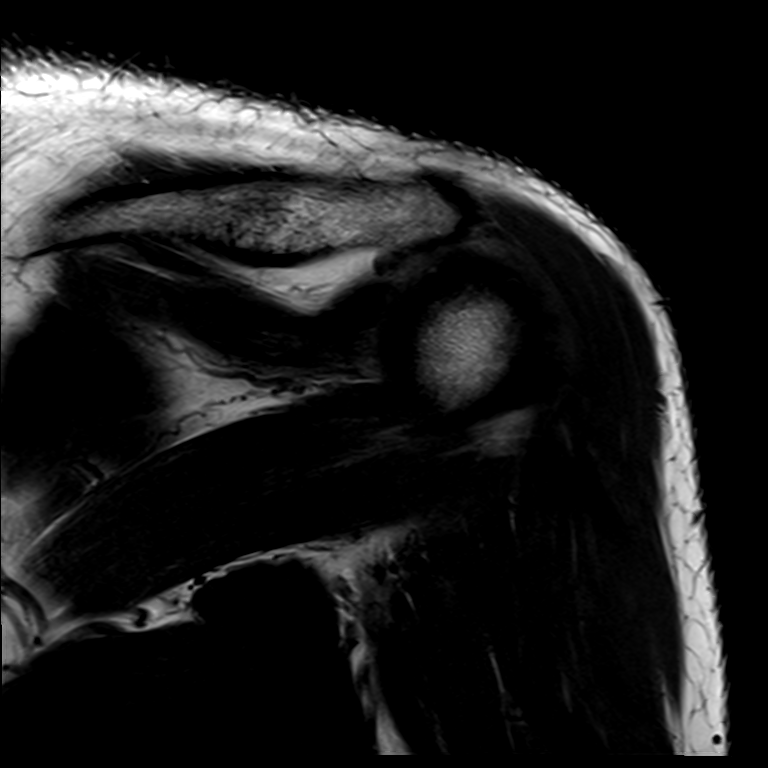

[Series 9: T2 fat-sat · oblique · left · 3.0mm · 0.22mm/px · 3 of 21 slices shown (1 of 3)]
[im 3/21]
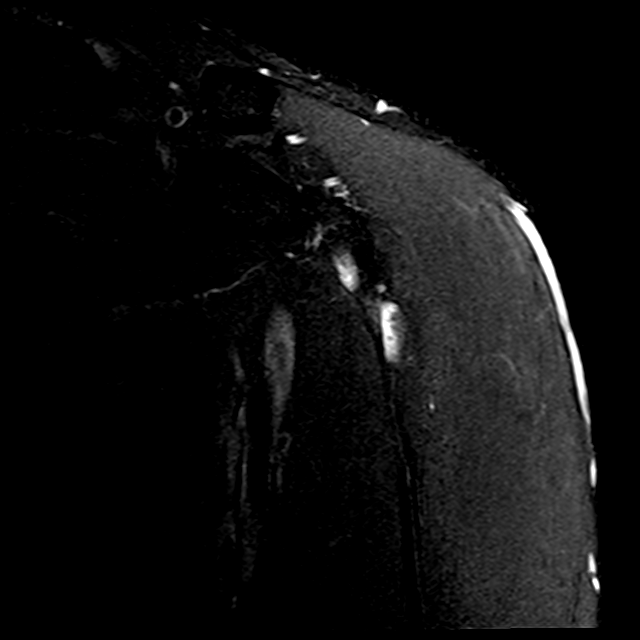
[im 12/21]
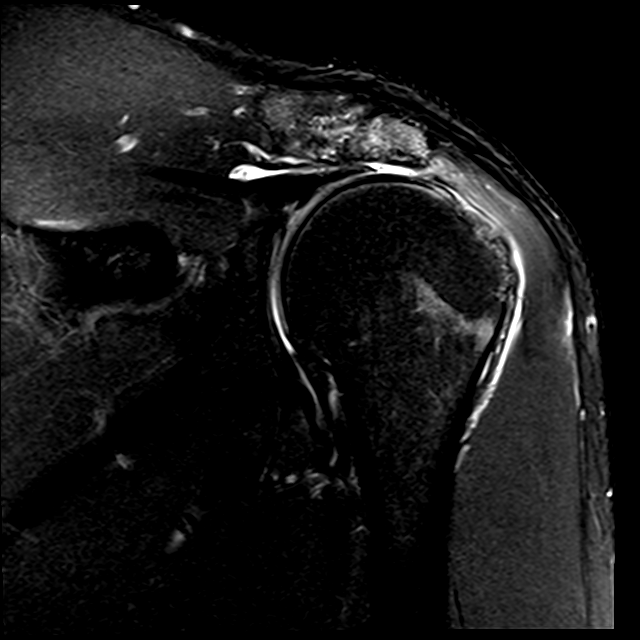
[im 18/21]
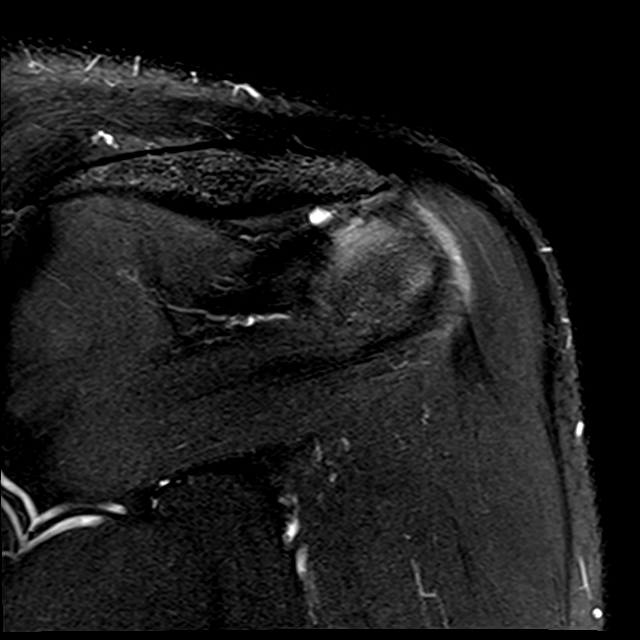

[Series 10: T2 fat-sat · oblique · left · 3.0mm · 0.44mm/px · 3 of 21 slices shown (2 of 3)]
[im 3/21]
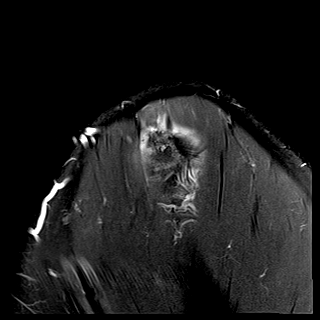
[im 12/21]
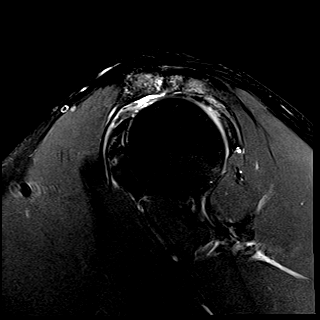
[im 18/21]
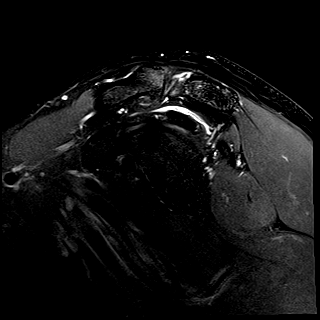

[Series 12: T2 fat-sat · axial · left · 3.0mm · 0.44mm/px · z∈[-70,-7]mm · 3 of 25 slices shown (3 of 3)]
[im 4/25]
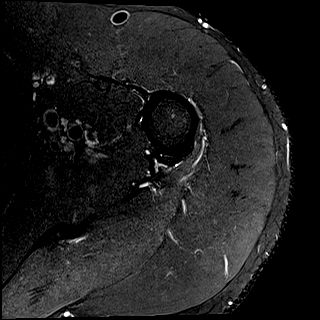
[im 13/25]
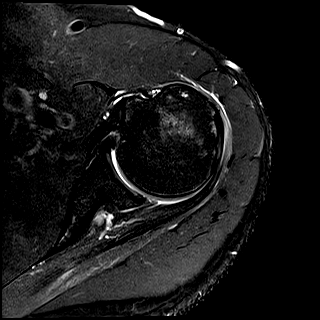
[im 22/25]
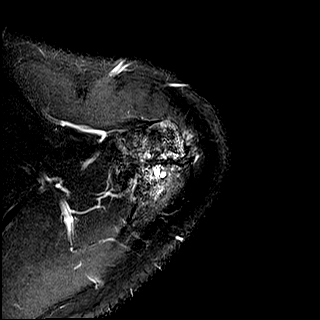

[14 of 40 positions shown; findings below may reference images not displayed]

FINDINGS: Rotator cuff: The patient has a complete supraspinatus tendon tear
with retraction just medial to the top of the humeral head, 3-3.5
cm. Infraspinatus worse than subscapularis tendinopathy without tear
is also identified.

Muscles:  Minimal atrophy of the supraspinatus is identified.

Biceps long head:  Intact.

Acromioclavicular Joint:  Advanced degenerative change is seen.

Glenohumeral Joint: Unremarkable.

Labrum:  Intact.

Bones: No fracture or worrisome marrow lesion. Os acromiale is
identified. The acromion is type 2. Small volume of fluid is seen in
the subacromial/subdeltoid bursa.
IMPRESSION: Complete supraspinatus tendon tear with 3-3.5 cm of retraction and
only minimal atrophy.

Infraspinatus worse than subscapularis tendinopathy without tear.

Advanced acromioclavicular osteoarthritis.  Os acromiale also noted.

Small volume of subacromial/subdeltoid fluid consistent bursitis.

## 2016-09-23 DIAGNOSIS — H40013 Open angle with borderline findings, low risk, bilateral: Secondary | ICD-10-CM | POA: Diagnosis not present

## 2016-10-12 ENCOUNTER — Encounter: Payer: Self-pay | Admitting: Adult Health

## 2016-10-12 DIAGNOSIS — H40013 Open angle with borderline findings, low risk, bilateral: Secondary | ICD-10-CM | POA: Diagnosis not present

## 2016-10-12 DIAGNOSIS — H52203 Unspecified astigmatism, bilateral: Secondary | ICD-10-CM | POA: Diagnosis not present

## 2016-10-12 DIAGNOSIS — H5213 Myopia, bilateral: Secondary | ICD-10-CM | POA: Diagnosis not present

## 2016-10-12 DIAGNOSIS — H53469 Homonymous bilateral field defects, unspecified side: Secondary | ICD-10-CM | POA: Diagnosis not present

## 2016-10-12 DIAGNOSIS — H524 Presbyopia: Secondary | ICD-10-CM | POA: Diagnosis not present

## 2016-10-19 ENCOUNTER — Encounter: Payer: Self-pay | Admitting: Adult Health

## 2016-10-19 ENCOUNTER — Ambulatory Visit (INDEPENDENT_AMBULATORY_CARE_PROVIDER_SITE_OTHER): Payer: BLUE CROSS/BLUE SHIELD | Admitting: Adult Health

## 2016-10-19 VITALS — BP 126/84 | Temp 98.0°F | Wt 211.0 lb

## 2016-10-19 DIAGNOSIS — H539 Unspecified visual disturbance: Secondary | ICD-10-CM | POA: Diagnosis not present

## 2016-10-19 DIAGNOSIS — H499 Unspecified paralytic strabismus: Secondary | ICD-10-CM | POA: Diagnosis not present

## 2016-10-19 LAB — CBC WITH DIFFERENTIAL/PLATELET
BASOS PCT: 0.2 % (ref 0.0–3.0)
Basophils Absolute: 0 10*3/uL (ref 0.0–0.1)
Eosinophils Absolute: 0 10*3/uL (ref 0.0–0.7)
Eosinophils Relative: 0.5 % (ref 0.0–5.0)
HEMATOCRIT: 46 % (ref 39.0–52.0)
Hemoglobin: 14.9 g/dL (ref 13.0–17.0)
LYMPHS ABS: 3.6 10*3/uL (ref 0.7–4.0)
LYMPHS PCT: 52.5 % — AB (ref 12.0–46.0)
MCHC: 32.3 g/dL (ref 30.0–36.0)
MCV: 92.7 fl (ref 78.0–100.0)
MONO ABS: 0.6 10*3/uL (ref 0.1–1.0)
Monocytes Relative: 8.5 % (ref 3.0–12.0)
NEUTROS ABS: 2.6 10*3/uL (ref 1.4–7.7)
Neutrophils Relative %: 38.3 % — ABNORMAL LOW (ref 43.0–77.0)
PLATELETS: 252 10*3/uL (ref 150.0–400.0)
RBC: 4.96 Mil/uL (ref 4.22–5.81)
RDW: 14.1 % (ref 11.5–15.5)
WBC: 6.9 10*3/uL (ref 4.0–10.5)

## 2016-10-19 LAB — BASIC METABOLIC PANEL
BUN: 11 mg/dL (ref 6–23)
CALCIUM: 9.6 mg/dL (ref 8.4–10.5)
CO2: 28 meq/L (ref 19–32)
CREATININE: 1 mg/dL (ref 0.40–1.50)
Chloride: 106 mEq/L (ref 96–112)
GFR: 102.8 mL/min (ref >60.00)
GLUCOSE: 79 mg/dL (ref 70–99)
Potassium: 4 mEq/L (ref 3.5–5.1)
Sodium: 140 mEq/L (ref 135–145)

## 2016-10-19 NOTE — Progress Notes (Addendum)
Subjective:    Patient ID: Scott Carson, male    DOB: [birth year], 46 y.o.   MRN: [id number]  HPI  46 year old male who  has a past medical history of Arthritis; Eczema of hand; Hypertension; Low back pain; and Smoker. He presents to the office today for follow up after being seen by Dr. Andria Frames at [facility].   Per Dr. 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 Need to rule out possible old TIA vs. Brain lesion. Dr. Leeanne Deed recommends MRI with and without contrast.   He reports no vision loss or blurred vision and no eye pain. He continues to have headaches 2-3 times per week.   Review of Systems See HPI   Past Medical History:  Diagnosis Date  . Arthritis    BACK  . Eczema of hand   . Hypertension   . Low back pain   . Smoker     Social History   Social History  . Marital status: Married    Spouse name: N/A  . Number of children: N/A  . Years of education: N/A   Occupational History  . Not on file.   Social History Main Topics  . Smoking status: Former Smoker    Packs/day: 0.50    Types: Cigars    Start date: 04/30/2012    Quit date: 07/01/2013  . Smokeless tobacco: Never Used  . Alcohol use 0.0 oz/week     Comment: occasionaly  . Drug use: Yes    Types: Marijuana     Comment: Quit smoking & marijuana 1 month ago  . Sexual activity: Not on file   Other Topics Concern  . Not on file   Social History Narrative   Married   Alcohol use-no      Current Smoker   Occupation: Architect            Past Surgical History:  Procedure Laterality Date  . RESECTION DISTAL CLAVICAL Left 08/13/2015   Procedure: RESECTION DISTAL CLAVICAL;  Surgeon: Ninetta Lights, MD;  Location: [place];  Service: Orthopedics;  Laterality: Left;  . SHOULDER ARTHROSCOPY WITH ROTATOR CUFF REPAIR AND SUBACROMIAL DECOMPRESSION Left 08/13/2015   Procedure: LEFT SHOULDER ARTHROSCOPY DEBRIDEMENT ACROMIOPLASTY, DISTAL  CLAVICAL EXCISION ROTATOR CUFF REPAIR ;  Surgeon: Ninetta Lights, MD;  Location: [place];  Service: Orthopedics;  Laterality: Left;  . TONSILLECTOMY    . UPPER GASTROINTESTINAL ENDOSCOPY      Family History  Problem Relation Age of Onset  . Hypertension Mother   . Diabetes Maternal Grandmother   . Stroke Maternal Grandmother   . Other Neg Hx        no family history of colon cancer  . Stomach cancer Neg Hx   . Esophageal cancer Neg Hx   . Prostate cancer Neg Hx   . Colon cancer Paternal Grandfather     No Known Allergies  Current Outpatient Prescriptions on File Prior to Visit  Medication Sig Dispense Refill  . labetalol (NORMODYNE) 300 MG tablet TAKE 1 TABLET BY MOUTH TWICE A DAY 180 tablet 3  . varenicline (CHANTIX CONTINUING MONTH PAK) 1 MG tablet Take 1 tablet (1 mg total) by mouth 2 (two) times daily. 180 tablet 1  . varenicline (CHANTIX STARTING MONTH PAK) 0.5 MG X 11 & 1 MG X 42 tablet Take one 0.5 mg tablet by mouth once daily for 3 days, then increase to one  0.5 mg tablet twice daily for 4 days, then increase to one 1 mg tablet twice daily. 53 tablet 0   No current facility-administered medications on file prior to visit.     BP 126/84 (BP Location: Left Arm)   Temp 98 F (36.7 C) (Oral)   Wt 211 lb (95.7 kg)   BMI 31.16 kg/m       Objective:   Physical Exam  Constitutional: He is oriented to person, place, and time. He appears well-developed and well-nourished. No distress.  Eyes: Pupils are equal, round, and reactive to light. Conjunctivae and EOM are normal. Right eye exhibits no discharge. Left eye exhibits no discharge. No scleral icterus.  Cardiovascular: Normal rate, regular rhythm, normal heart sounds and intact distal pulses.  Exam reveals no gallop and no friction rub.   No murmur heard. Pulmonary/Chest: Effort normal and breath sounds normal. No respiratory distress. He has no wheezes. He has no rales. He exhibits no tenderness.    Neurological: He is alert and oriented to person, place, and time.  Skin: Skin is warm and dry. No rash noted. He is not diaphoretic. No erythema. No pallor.  Psychiatric: He has a normal mood and affect. His behavior is normal. Judgment and thought content normal.  Nursing note and vitals reviewed.     Assessment & Plan:  1. Visual disturbance - MR Brain W Wo Contrast; Future - Basic Metabolic Panel - CBC with Differential/Platelet  Dorothyann Peng, NP

## 2016-10-20 ENCOUNTER — Ambulatory Visit: Payer: BLUE CROSS/BLUE SHIELD | Admitting: Adult Health

## 2016-10-23 ENCOUNTER — Ambulatory Visit
Admission: RE | Admit: 2016-10-23 | Discharge: 2016-10-23 | Disposition: A | Discharge status: Home / Self Care | Payer: BLUE CROSS/BLUE SHIELD | Source: Ambulatory Visit | Attending: Adult Health | Admitting: Adult Health

## 2016-10-23 DIAGNOSIS — H499 Unspecified paralytic strabismus: Secondary | ICD-10-CM

## 2016-10-23 DIAGNOSIS — H539 Unspecified visual disturbance: Secondary | ICD-10-CM

## 2016-10-23 DIAGNOSIS — H5316 Psychophysical visual disturbances: Secondary | ICD-10-CM | POA: Diagnosis not present

## 2016-10-23 MED ORDER — GADOBENATE DIMEGLUMINE 529 MG/ML IV SOLN
18.0000 mL | Freq: Once | INTRAVENOUS | Status: AC | PRN
  Administered 2016-10-23: 18 mL via INTRAVENOUS

## 2016-10-26 ENCOUNTER — Telehealth: Payer: Self-pay | Admitting: Adult Health

## 2016-10-26 DIAGNOSIS — I639 Cerebral infarction, unspecified: Secondary | ICD-10-CM

## 2016-10-26 NOTE — Telephone Encounter (Signed)
Reviewed MRI results with patient.   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 Associated hemosiderin and post ischemic enhancement.

## 2016-10-27 ENCOUNTER — Encounter: Payer: Self-pay | Admitting: Neurology

## 2016-11-11 ENCOUNTER — Encounter: Payer: Self-pay | Admitting: Adult Health

## 2016-11-14 DIAGNOSIS — S51812A Laceration without foreign body of left forearm, initial encounter: Secondary | ICD-10-CM | POA: Diagnosis not present

## 2016-11-14 DIAGNOSIS — S51802A Unspecified open wound of left forearm, initial encounter: Secondary | ICD-10-CM | POA: Diagnosis not present

## 2016-11-15 ENCOUNTER — Encounter: Payer: Self-pay | Admitting: Adult Health

## 2016-11-15 ENCOUNTER — Ambulatory Visit (INDEPENDENT_AMBULATORY_CARE_PROVIDER_SITE_OTHER): Payer: BLUE CROSS/BLUE SHIELD | Admitting: Adult Health

## 2016-11-15 ENCOUNTER — Ambulatory Visit: Payer: BLUE CROSS/BLUE SHIELD

## 2016-11-15 VITALS — BP 150/110 | HR 107 | Temp 98.0°F | Wt 207.3 lb

## 2016-11-15 DIAGNOSIS — I10 Essential (primary) hypertension: Secondary | ICD-10-CM
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
## 2016-11-22 ENCOUNTER — Telehealth: Payer: Self-pay | Admitting: Adult Health

## 2016-11-22 NOTE — Telephone Encounter (Signed)
Spoke to the pt and his wife.  See BP readings below.  Pt and wife confirm he is taking labetalol 300 mg tab.  Taking 1 tab am and 2 tab pm.  Pt denied symptoms.  Assessment & Plan:  1. Essential hypertension - His blood pressure has been very well controlled until now. I am going to have him monitor his BP at home, morning and night. Follow up with me via Mychart in two days.  - Consider adding an additional agent   Dorothyann Peng, NP  Taken from [place] on 11/15/16

## 2016-11-22 NOTE — Telephone Encounter (Signed)
Pt was seen on 11-15-16 and calling to report bp readings 11-19-16  144/103 , 11-20-16  139/105,  10/1 139/105 . [facility]

## 2016-11-23 ENCOUNTER — Other Ambulatory Visit: Payer: Self-pay | Admitting: Adult Health

## 2016-11-23 MED ORDER — AMLODIPINE BESYLATE 5 MG PO TABS: 5.0000 mg | ORAL_TABLET | Freq: Every day | ORAL | 3 refills | Status: DC

## 2016-11-23 MED ORDER — AMLODIPINE BESYLATE 5 MG PO TABS: 5.0000 mg | ORAL_TABLET | Freq: Every day | ORAL | 0 refills | Status: DC

## 2016-11-23 NOTE — Telephone Encounter (Signed)
I am going to place him on Norvasc 5 mg daily. Send me blood pressures in one week

## 2016-11-23 NOTE — Telephone Encounter (Signed)
Spoke to the pt and informed him to pick up new rx at the pharmacy. He will call back in 1 week with bp readings.

## 2016-11-25 ENCOUNTER — Ambulatory Visit: Payer: BLUE CROSS/BLUE SHIELD

## 2016-11-29 ENCOUNTER — Ambulatory Visit (INDEPENDENT_AMBULATORY_CARE_PROVIDER_SITE_OTHER): Payer: BLUE CROSS/BLUE SHIELD | Admitting: *Deleted

## 2016-11-29 DIAGNOSIS — Z23 Encounter for immunization: Secondary | ICD-10-CM

## 2017-02-06 DIAGNOSIS — H40003 Preglaucoma, unspecified, bilateral: Secondary | ICD-10-CM | POA: Diagnosis not present

## 2017-02-08 ENCOUNTER — Encounter: Payer: Self-pay | Admitting: Neurology

## 2017-02-08 ENCOUNTER — Ambulatory Visit: Payer: BLUE CROSS/BLUE SHIELD | Admitting: Neurology

## 2017-02-08 VITALS — BP 122/80 | HR 77 | Ht 69.0 in | Wt 213.2 lb

## 2017-02-08 DIAGNOSIS — Z79899 Other long term (current) drug therapy: Secondary | ICD-10-CM | POA: Diagnosis not present

## 2017-02-08 DIAGNOSIS — I639 Cerebral infarction, unspecified: Secondary | ICD-10-CM | POA: Diagnosis not present

## 2017-02-08 DIAGNOSIS — I10 Essential (primary) hypertension: Secondary | ICD-10-CM

## 2017-02-08 MED ORDER — SIMVASTATIN 10 MG PO TABS: 10.0000 mg | ORAL_TABLET | Freq: Every day | ORAL | 5 refills | Status: DC

## 2017-02-08 NOTE — Patient Instructions (Addendum)
1.  Start aspirin EC 81mg  daily for secondary stroke prevention 2.  Start simvastatin 10mg  daily for cholesterol.  We will repeat lipid profile in 3 months; also a hepatic panel. 3.  We will check CTA of head and neck 4.  We will check 2D echocardiogram 5.  We will check 24 hour holter monitor 6.  Continue blood pressure control 7.  Start routine cardiovascular exercise 8.  Start Mediterranean diet (see below) 9.  Follow up in 6 months.   Mediterranean Diet A Mediterranean diet refers to food and lifestyle choices that are based on the traditions of countries located on the [employer]. This way of eating has been shown to help prevent certain conditions and improve outcomes for people who have chronic diseases, like kidney disease and heart disease. What are tips for following this plan? Lifestyle  Cook and eat meals together with your family, when possible.  Drink enough fluid to keep your urine clear or pale yellow.  Be physically active every day. This includes: ? Aerobic exercise like running or swimming. ? Leisure activities like gardening, walking, or housework.  Get 7-8 hours of sleep each night.  If recommended by your health care provider, drink red wine in moderation. This means 1 glass a day for nonpregnant women and 2 glasses a day for men. A glass of wine equals 5 oz (150 mL). Reading food labels  Check the serving size of packaged foods. For foods such as rice and pasta, the serving size refers to the amount of cooked product, not dry.  Check the total fat in packaged foods. Avoid foods that have saturated fat or trans fats.  Check the ingredients list for added sugars, such as corn syrup. Shopping  At the grocery store, buy most of your food from the areas near the walls of the store. This includes: ? Fresh fruits and vegetables (produce). ? Grains, beans, nuts, and seeds. Some of these may be available in unpackaged forms or large amounts (in  bulk). ? Fresh seafood. ? Poultry and eggs. ? Low-fat dairy products.  Buy whole ingredients instead of prepackaged foods.  Buy fresh fruits and vegetables in-season from local farmers markets.  Buy frozen fruits and vegetables in resealable bags.  If you do not have access to quality fresh seafood, buy precooked frozen shrimp or canned fish, such as tuna, salmon, or sardines.  Buy small amounts of raw or cooked vegetables, salads, or olives from the deli or salad bar at your store.  Stock your pantry so you always have certain foods on hand, such as olive oil, canned tuna, canned tomatoes, rice, pasta, and beans. Cooking  Cook foods with extra-virgin olive oil instead of using butter or other vegetable oils.  Have meat as a side dish, and have vegetables or grains as your main dish. This means having meat in small portions or adding small amounts of meat to foods like pasta or stew.  Use beans or vegetables instead of meat in common dishes like chili or lasagna.  Experiment with different cooking methods. Try roasting or broiling vegetables instead of steaming or sauteing them.  Add frozen vegetables to soups, stews, pasta, or rice.  Add nuts or seeds for added healthy fat at each meal. You can add these to yogurt, salads, or vegetable dishes.  Marinate fish or vegetables using olive oil, lemon juice, garlic, and fresh herbs. Meal planning  Plan to eat 1 vegetarian meal one day each week. Try to work up to  2 vegetarian meals, if possible.  Eat seafood 2 or more times a week.  Have healthy snacks readily available, such as: ? Vegetable sticks with hummus. ? [place] yogurt. ? Fruit and nut trail mix.  Eat balanced meals throughout the week. This includes: ? Fruit: 2-3 servings a day ? Vegetables: 4-5 servings a day ? Low-fat dairy: 2 servings a day ? Fish, poultry, or lean meat: 1 serving a day ? Beans and legumes: 2 or more servings a week ? Nuts and seeds: 1-2 servings  a day ? Whole grains: 6-8 servings a day ? Extra-virgin olive oil: 3-4 servings a day  Limit red meat and sweets to only a few servings a month What are my food choices?  Mediterranean diet ? Recommended ? Grains: Whole-grain pasta. Brown rice. Bulgar wheat. Polenta. Couscous. Whole-wheat bread. [place]. ? Vegetables: Artichokes. Beets. Broccoli. Cabbage. Carrots. Eggplant. Green beans. Chard. Kale. Spinach. Onions. Leeks. Peas. Squash. Tomatoes. Peppers. Radishes. ? Fruits: Apples. Apricots. Avocado. Berries. Bananas. Cherries. Dates. Figs. Grapes. Lemons. Melon. Oranges. Peaches. Plums. Pomegranate. ? Meats and other protein foods: Beans. Almonds. Sunflower seeds. Pine nuts. Peanuts. Young. Salmon. Scallops. Shrimp. [place]. Tilapia. Clams. Oysters. Eggs. ? Dairy: Low-fat milk. Cheese. Greek yogurt. ? Beverages: Water. Red wine. Herbal tea. ? Fats and oils: Extra virgin olive oil. Avocado oil. Grape seed oil. ? Sweets and desserts: [place] yogurt with honey. Baked apples. Poached pears. Trail mix. ? Seasoning and other foods: Basil. Cilantro. Coriander. Cumin. Mint. Parsley. Sage. Rosemary. Tarragon. Garlic. Oregano. Thyme. Pepper. Balsalmic vinegar. Tahini. Hummus. Tomato sauce. Olives. Mushrooms. ? Limit these ? Grains: Prepackaged pasta or rice dishes. Prepackaged cereal with added sugar. ? Vegetables: Deep fried potatoes (french fries). ? Fruits: Fruit canned in syrup. ? Meats and other protein foods: Beef. Pork. Lamb. Poultry with skin. Hot dogs. Bernie[place] Salines. ? Dairy: Ice cream. Sour cream. Whole milk. ? Beverages: Juice. Sugar-sweetened soft drinks. Beer. Liquor and spirits. ? Fats and oils: Butter. Canola oil. Vegetable oil. Beef fat (tallow). Lard. ? Sweets and desserts: Cookies. Cakes. Pies. Candy. ? Seasoning and other foods: Mayonnaise. Premade sauces and marinades. ? The items listed may not be a complete list. Talk with your dietitian about what dietary choices are right for  you. Summary  The Mediterranean diet includes both food and lifestyle choices.  Eat a variety of fresh fruits and vegetables, beans, nuts, seeds, and whole grains.  Limit the amount of red meat and sweets that you eat.  Talk with your health care provider about whether it is safe for you to drink red wine in moderation. This means 1 glass a day for nonpregnant women and 2 glasses a day for men. A glass of wine equals 5 oz (150 mL). This information is not intended to replace advice given to you by your health care provider. Make sure you discuss any questions you have with your health care provider. Document Released: 10/01/2015 Document Revised: 11/03/2015 Document Reviewed: 10/01/2015 Elsevier Interactive Patient Education  [employer].

## 2017-02-08 NOTE — Progress Notes (Signed)
NEUROLOGY CONSULTATION NOTE  Scott Carson MRN: [id number] DOB: Jul 06, 1970  Referring provider: Dorothyann Peng, NP Primary care provider: Dorothyann Peng, NP  Reason for consult:  stroke  HISTORY OF PRESENT ILLNESS: Scott Carson is a 46 year old right-handed male with hypertension and former cigar smoker who presents for stroke.  History supplemented by PCP and optometry notes.  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  He did not note any visual disturbance himself.  He followed up with the optometrist yesterday and vision is improved.  The only medications he takes are amlodipine and labetolol for blood pressure.  05/26/16 LDL 81; hepatic panel with t bili 1, ALP 55, AST 22 and ALT 16.   10/19/16 serum glucose 79.  PAST MEDICAL HISTORY: Past Medical History:  Diagnosis Date  . Arthritis    BACK  . Eczema of hand   . Hypertension   . Low back pain   . Smoker     PAST SURGICAL HISTORY: Past Surgical History:  Procedure Laterality Date  . RESECTION DISTAL CLAVICAL Left 08/13/2015   Procedure: RESECTION DISTAL CLAVICAL;  Surgeon: Ninetta Lights, MD;  Location: [facility];  Service: Orthopedics;  Laterality: Left;  . SHOULDER ARTHROSCOPY WITH ROTATOR CUFF REPAIR AND SUBACROMIAL DECOMPRESSION Left 08/13/2015   Procedure: LEFT SHOULDER ARTHROSCOPY DEBRIDEMENT ACROMIOPLASTY, DISTAL CLAVICAL EXCISION ROTATOR CUFF REPAIR ;  Surgeon: Ninetta Lights, MD;  Location: [facility];  Service: Orthopedics;  Laterality: Left;  . TONSILLECTOMY    . UPPER GASTROINTESTINAL ENDOSCOPY      MEDICATIONS: Current Outpatient Medications on File Prior to Visit  Medication Sig Dispense Refill  . amLODipine  (NORVASC) 5 MG tablet Take 1 tablet (5 mg total) by mouth daily. 30 tablet 3  . labetalol (NORMODYNE) 300 MG tablet TAKE 1 TABLET BY MOUTH TWICE A DAY 180 tablet 3  . varenicline (CHANTIX CONTINUING MONTH PAK) 1 MG tablet Take 1 tablet (1 mg total) by mouth 2 (two) times daily. (Patient not taking: Reported on 02/08/2017) 180 tablet 1  . varenicline (CHANTIX STARTING MONTH PAK) 0.5 MG X 11 & 1 MG X 42 tablet Take one 0.5 mg tablet by mouth once daily for 3 days, then increase to one 0.5 mg tablet twice daily for 4 days, then increase to one 1 mg tablet twice daily. (Patient not taking: Reported on 02/08/2017) 53 tablet 0   No current facility-administered medications on file prior to visit.     ALLERGIES: No Known Allergies  FAMILY HISTORY: Family History  Problem Relation Age of Onset  . Hypertension Mother   . Glaucoma Father   . Diabetes Maternal Grandmother   . Stroke Maternal Grandmother   . Colon cancer Paternal Grandfather   . Other Neg Hx        no family history of colon cancer  . Stomach cancer Neg Hx   . Esophageal cancer Neg Hx   . Prostate cancer Neg Hx     SOCIAL HISTORY: Social History   Socioeconomic History  . Marital status: Married    Spouse name: Kennyth Lose  . Number of children: 3  . Years of education: Not on file  . Highest education level: 12th grade  Social Needs  .  Financial resource strain: Not on file  . Food insecurity - worry: Not on file  . Food insecurity - inability: Not on file  . Transportation needs - medical: Not on file  . Transportation needs - non-medical: Not on file  Occupational History  . Occupation: Designer, multimedia    Comment: Dance movement psychotherapist  Tobacco Use  . Smoking status: Former Smoker    Packs/day: 0.50    Types: Cigars    Start date: 04/30/2012    Last attempt to quit: 07/01/2013    Years since quitting: 3.6  . Smokeless tobacco: Never Used  Substance and Sexual Activity  . Alcohol use: No    Alcohol/week: 0.0 oz     Frequency: Never    Comment: occasionaly  . Drug use: Yes    Types: Marijuana    Comment: Quit smoking & marijuana 1 month ago  . Sexual activity: Not on file  Other Topics Concern  . Not on file  Social History Narrative   Married   Alcohol use-no      Current Smoker   Occupation: Architect         Pt quit smoking cigarettes and cigars with Chantix. Does use cannibus. Drinks 2-3 cuos of coffee and 2-3 glasses of tea a day. No regular exercise, though is active at work. He is an only child.         REVIEW OF SYSTEMS: Constitutional: No fevers, chills, or sweats, no generalized fatigue, change in appetite Eyes: No visual changes, double vision, eye pain Ear, nose and throat: No hearing loss, ear pain, nasal congestion, sore throat Cardiovascular: No chest pain, palpitations Respiratory:  No shortness of breath at rest or with exertion, wheezes GastrointestinaI: No nausea, vomiting, diarrhea, abdominal pain, fecal incontinence Genitourinary:  No dysuria, urinary retention or frequency Musculoskeletal:  No neck pain, back pain Integumentary: No rash, pruritus, skin lesions Neurological: as above Psychiatric: No depression, insomnia, anxiety Endocrine: No palpitations, fatigue, diaphoresis, mood swings, change in appetite, change in weight, increased thirst Hematologic/Lymphatic:  No purpura, petechiae. Allergic/Immunologic: no itchy/runny eyes, nasal congestion, recent allergic reactions, rashes  PHYSICAL EXAM: Vitals:   02/08/17 0805  BP: 122/80  Pulse: 77  SpO2: 98%   General: No acute distress.  Patient appears well-groomed.  Head:  Normocephalic/atraumatic Eyes:  fundi examined but not visualized Neck: supple, no paraspinal tenderness, full range of motion Back: No paraspinal tenderness Heart: regular rate and rhythm Lungs: Clear to auscultation bilaterally. Vascular: No carotid bruits. Neurological Exam: Mental status: alert and oriented to person, place, and  time, recent and remote memory intact, fund of knowledge intact, attention and concentration intact, speech fluent and not dysarthric, language intact. Cranial nerves: CN I: not tested CN II: pupils equal, round and reactive to light, visual fields intact CN III, IV, VI:  full range of motion, no nystagmus, no ptosis CN V: facial sensation intact CN VII: upper and lower face symmetric CN VIII: hearing intact CN IX, X: gag intact, uvula midline CN XI: sternocleidomastoid and trapezius muscles intact CN XII: tongue midline Bulk & Tone: normal, no fasciculations. Motor:  5/5 throughout  Sensation: temperature and vibration sensation intact. Deep Tendon Reflexes:  2+ throughout, toes downgoing.  Finger to nose testing:  Without dysmetria.  Heel to shin:  Without dysmetria.  Gait:  Normal station and stride.  Able to turn and tandem walk. Romberg negative.  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 Hypertension  PLAN: 1.  Start aspirin EC 81mg   daily for secondary stroke prevention 2.  Start simvastatin 10mg  daily for cholesterol.  We will repeat lipid profile and hepatic panel in 3 months. 3.  We will check CTA of head and neck 4.  We will check 2D echocardiogram with bubble study 5.  We will check 24 hour holter monitor 6.  Continue blood pressure control 7.  Start routine cardiovascular exercise 8.  Start Mediterranean diet (see below) 9.  Follow up in 6 months.  Thank you for allowing me to take part in the care of this patient.  Metta Clines, DO  CC:  Dorothyann Peng, NP

## 2017-02-08 NOTE — Addendum Note (Signed)
Addended by: Clois Comber on: 02/08/2017 09:40 AM   Modules accepted: Orders

## 2017-02-17 ENCOUNTER — Ambulatory Visit (HOSPITAL_COMMUNITY): Payer: BLUE CROSS/BLUE SHIELD | Attending: Internal Medicine

## 2017-02-17 ENCOUNTER — Other Ambulatory Visit: Payer: Self-pay

## 2017-02-17 DIAGNOSIS — I77819 Aortic ectasia, unspecified site: Secondary | ICD-10-CM | POA: Diagnosis not present

## 2017-02-17 DIAGNOSIS — I639 Cerebral infarction, unspecified: Secondary | ICD-10-CM | POA: Insufficient documentation

## 2017-02-17 DIAGNOSIS — Z720 Tobacco use: Secondary | ICD-10-CM | POA: Insufficient documentation

## 2017-02-17 DIAGNOSIS — I10 Essential (primary) hypertension: Secondary | ICD-10-CM | POA: Diagnosis not present

## 2017-02-20 ENCOUNTER — Telehealth: Payer: Self-pay

## 2017-02-20 ENCOUNTER — Ambulatory Visit
Admission: RE | Admit: 2017-02-20 | Discharge: 2017-02-20 | Disposition: A | Discharge status: Home / Self Care | Payer: BLUE CROSS/BLUE SHIELD | Source: Ambulatory Visit | Attending: Neurology | Admitting: Neurology

## 2017-02-20 DIAGNOSIS — I639 Cerebral infarction, unspecified: Secondary | ICD-10-CM

## 2017-02-20 MED ORDER — IOPAMIDOL (ISOVUE-370) INJECTION 76%
100.0000 mL | Freq: Once | INTRAVENOUS | Status: AC | PRN
  Administered 2017-02-20: 100 mL via INTRAVENOUS

## 2017-02-20 NOTE — Telephone Encounter (Signed)
-----   Message from Cameron Sprang, MD sent at 02/20/2017  9:28 AM EST ----- Pls let him know the echocardiogram was unremarkable. Thanks

## 2017-02-20 NOTE — Telephone Encounter (Signed)
Called Pt, LM on VM, advising Echo was unremarkable

## 2017-02-22 ENCOUNTER — Telehealth: Payer: Self-pay

## 2017-02-22 NOTE — Telephone Encounter (Signed)
-----   Message from Pieter Partridge, DO sent at 02/22/2017  7:24 AM EST ----- CT shows no blockage in the arteries of the head and neck.

## 2017-02-22 NOTE — Telephone Encounter (Signed)
Called and spoke with Pt, advsd him of CTA results

## 2017-03-02 ENCOUNTER — Ambulatory Visit (INDEPENDENT_AMBULATORY_CARE_PROVIDER_SITE_OTHER): Payer: BLUE CROSS/BLUE SHIELD

## 2017-03-02 ENCOUNTER — Other Ambulatory Visit: Payer: Self-pay | Admitting: Neurology

## 2017-03-02 DIAGNOSIS — I639 Cerebral infarction, unspecified: Secondary | ICD-10-CM

## 2017-03-02 DIAGNOSIS — I10 Essential (primary) hypertension: Secondary | ICD-10-CM

## 2017-03-02 DIAGNOSIS — I4891 Unspecified atrial fibrillation: Secondary | ICD-10-CM | POA: Diagnosis not present

## 2017-03-13 ENCOUNTER — Telehealth: Payer: Self-pay

## 2017-03-13 NOTE — Telephone Encounter (Signed)
Called and LM for Pt advising of holter results

## 2017-03-13 NOTE — Telephone Encounter (Signed)
-----   Message from Pieter Partridge, DO sent at 03/13/2017 10:10 AM EST ----- holter monitor did not demonstrate any arrhythmias or anything concerning that would be a cause for stroke.

## 2017-03-14 ENCOUNTER — Other Ambulatory Visit: Payer: Self-pay | Admitting: Adult Health

## 2017-03-29 ENCOUNTER — Telehealth: Payer: Self-pay | Admitting: Adult Health

## 2017-03-29 DIAGNOSIS — F17200 Nicotine dependence, unspecified, uncomplicated: Secondary | ICD-10-CM

## 2017-03-29 MED ORDER — VARENICLINE TARTRATE 1 MG PO TABS: 1.0000 mg | ORAL_TABLET | Freq: Two times a day (BID) | ORAL | 1 refills | Status: DC

## 2017-03-29 NOTE — Telephone Encounter (Signed)
Sent to the pharmacy by e-scribe. 

## 2017-03-29 NOTE — Telephone Encounter (Signed)
Ok to refill Chantix Continuation pack + 1

## 2017-03-29 NOTE — Telephone Encounter (Signed)
Varenicline (Chantix Continuing Month Pak) 1 MG tablet refill Last OV: 11/15/16 Last Refill: 07/26/16 Pharmacy: [facility]

## 2017-03-29 NOTE — Telephone Encounter (Signed)
Copied from [place] [id number]. Topic: General - Other >> Mar 29, 2017  8:02 AM Carolyn Stare wrote:  Pt call to ask if can have a refill   varenicline (CHANTIX CONTINUING MONTH PAK) 1 MG tablet  Pharmacy  [facility]

## 2017-04-05 ENCOUNTER — Other Ambulatory Visit: Payer: Self-pay

## 2017-04-05 ENCOUNTER — Encounter (HOSPITAL_COMMUNITY): Payer: Self-pay | Admitting: Family Medicine

## 2017-04-05 ENCOUNTER — Ambulatory Visit (HOSPITAL_COMMUNITY)
Admission: EM | Admit: 2017-04-05 | Discharge: 2017-04-05 | Disposition: A | Discharge status: Home / Self Care | Payer: BLUE CROSS/BLUE SHIELD | Attending: Family Medicine | Admitting: Family Medicine

## 2017-04-05 DIAGNOSIS — W312XXA Contact with powered woodworking and forming machines, initial encounter: Secondary | ICD-10-CM | POA: Diagnosis not present

## 2017-04-05 DIAGNOSIS — S61213A Laceration without foreign body of left middle finger without damage to nail, initial encounter: Secondary | ICD-10-CM | POA: Diagnosis not present

## 2017-04-05 NOTE — ED Triage Notes (Signed)
Pt states he was cutting wood with an electric saw and accidentally pushed his finger into the table saw. Pt has lac to L middle finger.

## 2017-04-12 NOTE — ED Provider Notes (Signed)
  [facility]   [id number] 04/05/17 Arrival Time: 1941  ASSESSMENT & PLAN:  1. Laceration of left middle finger without foreign body without damage to nail, initial encounter    He will contact his PCP re Td status.  Wound copiously irrigated. He prefers steri-strips for closure vs suturing. I think this is reasonable. Wound care instructions given. May f/u with his PCP or here if needed. Discussed typical healing time.  Reviewed expectations re: course of current medical issues. Questions answered. Outlined signs and symptoms indicating need for more acute intervention. Patient verbalized understanding. After Visit Summary given.   SUBJECTIVE:  Scott Carson is a 47 y.o. male who presents with a laceration of his distal L 3rd fingertip. Cut today on a table saw. Moderate bleeding that was controlled. Washed wound immediately. Minimal discomfort. No specific aggravating or alleviating factors reported. No extremity sensation changes or weakness. Unsure of last Td; "think a few years ago."  ROS: As per HPI.   OBJECTIVE:  Vitals:   04/05/17 2038  BP: (!) 168/111  Pulse: 70  Resp: 18  Temp: (!) 97.4 F (36.3 C)  SpO2: 100%    General appearance: alert; no distress Skin: laceration of distal L 3rd fingertip; nail intact; lac across tip of finger; size: approx 1 cm; no gaping of wound; edges approximated well at rest; no active bleeding; no foreign body Psychological:  alert and cooperative; normal mood and affect  No Known Allergies  Social History   Socioeconomic History  . Marital status: Married    Spouse name: Kennyth Lose  . Number of children: 3  . Years of education: None  . Highest education level: 12th grade  Social Needs  . Financial resource strain: None  . Food insecurity - worry: None  . Food insecurity - inability: None  . Transportation needs - medical: None  . Transportation needs - non-medical: None  Occupational History  . Occupation:  Designer, multimedia    Comment: Dance movement psychotherapist  Tobacco Use  . Smoking status: Former Smoker    Packs/day: 0.50    Types: Cigars    Start date: 04/30/2012    Last attempt to quit: 07/01/2013    Years since quitting: 3.7  . Smokeless tobacco: Never Used  Substance and Sexual Activity  . Alcohol use: No    Alcohol/week: 0.0 oz    Frequency: Never    Comment: occasionaly  . Drug use: Yes    Types: Marijuana    Comment: Quit smoking & marijuana 1 month ago  . Sexual activity: None  Other Topics Concern  . None  Social History Narrative   Married   Alcohol use-no      Current Smoker   Occupation: Architect         Pt quit smoking cigarettes and cigars with Chantix. Does use cannibus. Drinks 2-3 cuos of coffee and 2-3 glasses of tea a day. No regular exercise, though is active at work. He is an only child.            Vanessa Kick, MD 04/12/17 [phone]

## 2017-04-23 ENCOUNTER — Other Ambulatory Visit: Payer: Self-pay | Admitting: Adult Health

## 2017-04-25 NOTE — Telephone Encounter (Signed)
CPE scheduled April 2019.

## 2017-05-30 ENCOUNTER — Encounter: Payer: BLUE CROSS/BLUE SHIELD | Admitting: Adult Health

## 2017-06-01 ENCOUNTER — Encounter: Payer: Self-pay | Admitting: Family Medicine

## 2017-06-01 ENCOUNTER — Ambulatory Visit (INDEPENDENT_AMBULATORY_CARE_PROVIDER_SITE_OTHER): Payer: BLUE CROSS/BLUE SHIELD | Admitting: Adult Health

## 2017-06-01 ENCOUNTER — Encounter: Payer: Self-pay | Admitting: Adult Health

## 2017-06-01 VITALS — BP 164/100 | Temp 98.4°F | Ht 71.0 in | Wt 211.0 lb

## 2017-06-01 DIAGNOSIS — Z23 Encounter for immunization: Secondary | ICD-10-CM

## 2017-06-01 DIAGNOSIS — Z0000 Encounter for general adult medical examination without abnormal findings: Secondary | ICD-10-CM

## 2017-06-01 DIAGNOSIS — Z87891 Personal history of nicotine dependence: Secondary | ICD-10-CM

## 2017-06-01 DIAGNOSIS — I10 Essential (primary) hypertension: Secondary | ICD-10-CM

## 2017-06-01 DIAGNOSIS — Z125 Encounter for screening for malignant neoplasm of prostate: Secondary | ICD-10-CM | POA: Diagnosis not present

## 2017-06-01 DIAGNOSIS — E782 Mixed hyperlipidemia: Secondary | ICD-10-CM | POA: Diagnosis not present

## 2017-06-01 DIAGNOSIS — F17200 Nicotine dependence, unspecified, uncomplicated: Secondary | ICD-10-CM

## 2017-06-01 LAB — CBC WITH DIFFERENTIAL/PLATELET
BASOS PCT: 0.3 % (ref 0.0–3.0)
Basophils Absolute: 0 10*3/uL (ref 0.0–0.1)
EOS ABS: 0 10*3/uL (ref 0.0–0.7)
Eosinophils Relative: 0.9 % (ref 0.0–5.0)
HCT: 44.8 % (ref 39.0–52.0)
HEMOGLOBIN: 15.2 g/dL (ref 13.0–17.0)
Lymphocytes Relative: 39.5 % (ref 12.0–46.0)
Lymphs Abs: 1.7 10*3/uL (ref 0.7–4.0)
MCHC: 34 g/dL (ref 30.0–36.0)
MCV: 90 fl (ref 78.0–100.0)
MONO ABS: 0.4 10*3/uL (ref 0.1–1.0)
Monocytes Relative: 8.5 % (ref 3.0–12.0)
Neutro Abs: 2.2 10*3/uL (ref 1.4–7.7)
Neutrophils Relative %: 50.8 % (ref 43.0–77.0)
Platelets: 247 10*3/uL (ref 150.0–400.0)
RBC: 4.98 Mil/uL (ref 4.22–5.81)
RDW: 13.6 % (ref 11.5–15.5)
WBC: 4.2 10*3/uL (ref 4.0–10.5)

## 2017-06-01 LAB — HEPATIC FUNCTION PANEL
ALT: 20 U/L (ref 0–53)
AST: 22 U/L (ref 0–37)
Albumin: 4.4 g/dL (ref 3.5–5.2)
Alkaline Phosphatase: 67 U/L (ref 39–117)
BILIRUBIN DIRECT: 0.2 mg/dL (ref 0.0–0.3)
BILIRUBIN TOTAL: 1 mg/dL (ref 0.2–1.2)
Total Protein: 7.2 g/dL (ref 6.0–8.3)

## 2017-06-01 LAB — TSH: TSH: 1.13 u[IU]/mL (ref 0.35–4.50)

## 2017-06-01 LAB — BASIC METABOLIC PANEL
BUN: 9 mg/dL (ref 6–23)
CALCIUM: 9.5 mg/dL (ref 8.4–10.5)
CO2: 26 mEq/L (ref 19–32)
CREATININE: 0.93 mg/dL (ref 0.40–1.50)
Chloride: 106 mEq/L (ref 96–112)
GFR: 111.48 mL/min (ref >60.00)
Glucose, Bld: 96 mg/dL (ref 70–99)
Potassium: 4 mEq/L (ref 3.5–5.1)
Sodium: 138 mEq/L (ref 135–145)

## 2017-06-01 LAB — LIPID PANEL
CHOL/HDL RATIO: 3
Cholesterol: 128 mg/dL (ref 0–200)
HDL: 48.7 mg/dL (ref >39.00)
LDL CALC: 72 mg/dL (ref 0–99)
NonHDL: 79.31
TRIGLYCERIDES: 37 mg/dL (ref 0.0–149.0)
VLDL: 7.4 mg/dL (ref 0.0–40.0)

## 2017-06-01 LAB — PSA: PSA: 2.43 ng/mL (ref 0.10–4.00)

## 2017-06-01 MED ORDER — SIMVASTATIN 10 MG PO TABS: 10.0000 mg | ORAL_TABLET | Freq: Every day | ORAL | 3 refills | Status: DC

## 2017-06-01 MED ORDER — AMLODIPINE BESYLATE 5 MG PO TABS: 5.0000 mg | ORAL_TABLET | Freq: Every day | ORAL | 0 refills | Status: DC

## 2017-06-01 MED ORDER — NYSTATIN 100000 UNIT/GM EX CREA: 1.0000 "application " | TOPICAL_CREAM | Freq: Two times a day (BID) | CUTANEOUS | 3 refills | Status: DC

## 2017-06-01 NOTE — Patient Instructions (Signed)
It was great seeing you today   I will follow up with you regarding your blood work   Please follow up with me in one month to make sure your blood pressure is under control    You are supposed to follow up with Dr. Tomi Likens in June   Congrats on quitting smoking

## 2017-06-01 NOTE — Progress Notes (Signed)
Subjective:    Patient ID: Scott Carson, male    DOB: 06/12/1970, 47 y.o.   MRN: [id number]  HPI  Patient presents for yearly preventative medicine examination. He is a pleasant 47 year old male who  has a past medical history of Arthritis, Eczema of hand, Hypertension, Low back pain, and Smoker.    He continues to work in Architect  Hypertension -takes Norvasc 5 mg daily and levator tall 300 mg twice daily. Reports that he has been without his medication for a few weeks BP Readings from Last 3 Encounters:  06/01/17 (!) 164/100  04/05/17 (!) 168/111  02/08/17 122/80   Hyperlipidemia - Takes simvastatin 20 mg daily  Lab Results  Component Value Date   CHOL 134 05/26/2016   HDL 43.10 05/26/2016   LDLCALC 81 05/26/2016   TRIG 52.0 05/26/2016   CHOLHDL 3 05/26/2016   Tobacco Use - He reports that he quit smoking using Chantix. He has been smoke free for about one month   All immunizations and health maintenance protocols were reviewed with the patient and needed orders were placed. He is due for tdap   Appropriate screening laboratory values were ordered for the patient including screening of hyperlipidemia, renal function and hepatic function. If indicated by BPH, a PSA was ordered.  Medication reconciliation,  past medical history, social history, problem list and allergies were reviewed in detail with the patient   Goals were established with regard to weight loss, exercise, and  diet in compliance with medications. He has been working on diet. Has a physically demanding job.   End of life planning was discussed.  Review of Systems  Constitutional: Negative.   HENT: Negative.   Eyes: Negative.   Respiratory: Negative.   Cardiovascular: Negative.   Gastrointestinal: Negative.   Endocrine: Negative.   Genitourinary: Negative.   Musculoskeletal: Negative.   Skin: Negative.   Allergic/Immunologic: Negative.   Neurological: Negative.   Hematological: Negative.     Psychiatric/Behavioral: Negative.   All other systems reviewed and are negative.  Past Medical History:  Diagnosis Date  . Arthritis    BACK  . Eczema of hand   . Hypertension   . Low back pain   . Smoker     Social History   Socioeconomic History  . Marital status: Married    Spouse name: Kennyth Lose  . Number of children: 3  . Years of education: Not on file  . Highest education level: 12th grade  Occupational History  . Occupation: Designer, multimedia    Comment: Dance movement psychotherapist  Social Needs  . Financial resource strain: Not on file  . Food insecurity:    Worry: Not on file    Inability: Not on file  . Transportation needs:    Medical: Not on file    Non-medical: Not on file  Tobacco Use  . Smoking status: Former Smoker    Packs/day: 0.50    Types: Cigars    Start date: 04/30/2012    Last attempt to quit: 07/01/2013    Years since quitting: 3.9  . Smokeless tobacco: Never Used  Substance and Sexual Activity  . Alcohol use: No    Alcohol/week: 0.0 oz    Frequency: Never    Comment: occasionaly  . Drug use: Yes    Types: Marijuana    Comment: Quit smoking & marijuana 1 month ago  . Sexual activity: Not on file  Lifestyle  . Physical activity:    Days per week: Not  on file    Minutes per session: Not on file  . Stress: Not on file  Relationships  . Social connections:    Talks on phone: Not on file    Gets together: Not on file    Attends religious service: Not on file    Active member of club or organization: Not on file    Attends meetings of clubs or organizations: Not on file    Relationship status: Not on file  . Intimate partner violence:    Fear of current or ex partner: Not on file    Emotionally abused: Not on file    Physically abused: Not on file    Forced sexual activity: Not on file  Other Topics Concern  . Not on file  Social History Narrative   Married   Alcohol use-no      Current Smoker   Occupation: Architect         Pt quit  smoking cigarettes and cigars with Chantix. Does use cannibus. Drinks 2-3 cuos of coffee and 2-3 glasses of tea a day. No regular exercise, though is active at work. He is an only child.         Past Surgical History:  Procedure Laterality Date  . RESECTION DISTAL CLAVICAL Left 08/13/2015   Procedure: RESECTION DISTAL CLAVICAL;  Surgeon: Ninetta Lights, MD;  Location: [facility];  Service: Orthopedics;  Laterality: Left;  . SHOULDER ARTHROSCOPY WITH ROTATOR CUFF REPAIR AND SUBACROMIAL DECOMPRESSION Left 08/13/2015   Procedure: LEFT SHOULDER ARTHROSCOPY DEBRIDEMENT ACROMIOPLASTY, DISTAL CLAVICAL EXCISION ROTATOR CUFF REPAIR ;  Surgeon: Ninetta Lights, MD;  Location: [facility];  Service: Orthopedics;  Laterality: Left;  . TONSILLECTOMY    . UPPER GASTROINTESTINAL ENDOSCOPY      Family History  Problem Relation Age of Onset  . Hypertension Mother   . Glaucoma Father   . Diabetes Maternal Grandmother   . Stroke Maternal Grandmother   . Colon cancer Paternal Grandfather   . Other Neg Hx        no family history of colon cancer  . Stomach cancer Neg Hx   . Esophageal cancer Neg Hx   . Prostate cancer Neg Hx     No Known Allergies  Current Outpatient Medications on File Prior to Visit  Medication Sig Dispense Refill  . labetalol (NORMODYNE) 300 MG tablet TAKE 1 TABLET BY MOUTH TWICE A DAY 180 tablet 3   No current facility-administered medications on file prior to visit.     BP (!) 164/100   Temp 98.4 F (36.9 C) (Oral)   Ht 5\' 11"  (1.803 m)   Wt 211 lb (95.7 kg)   BMI 29.43 kg/m       Objective:   Physical Exam  Constitutional: He is oriented to person, place, and time. He appears well-developed and well-nourished. No distress.  HENT:  Head: Normocephalic and atraumatic.  Right Ear: Hearing, tympanic membrane, external ear and ear canal normal.  Left Ear: Hearing, tympanic membrane, external ear and ear canal normal.  Nose: Nose normal.    Mouth/Throat: Oropharynx is clear and moist and mucous membranes are normal. Abnormal dentition. No oropharyngeal exudate.  Eyes: Pupils are equal, round, and reactive to light. Conjunctivae and EOM are normal. Right eye exhibits no discharge. Left eye exhibits no discharge. No scleral icterus.  Neck: Normal range of motion. Neck supple. No JVD present. No tracheal deviation present. No thyromegaly present.  Cardiovascular: Normal rate, regular rhythm, normal  heart sounds and intact distal pulses. Exam reveals no gallop and no friction rub.  No murmur heard. Pulmonary/Chest: Effort normal and breath sounds normal. No stridor. No respiratory distress. He has no wheezes. He has no rales. He exhibits no tenderness.  Abdominal: Soft. Bowel sounds are normal. He exhibits no distension and no mass. There is no tenderness. There is no rebound and no guarding.  Musculoskeletal: Normal range of motion. He exhibits no edema, tenderness or deformity.  Lymphadenopathy:    He has no cervical adenopathy.  Neurological: He is alert and oriented to person, place, and time. He has normal reflexes. He displays normal reflexes. No cranial nerve deficit. He exhibits normal muscle tone. Coordination normal.  Skin: Skin is warm and dry. No rash noted. He is not diaphoretic. No erythema. No pallor.  Psychiatric: He has a normal mood and affect. His behavior is normal. Judgment and thought content normal.  Nursing note and vitals reviewed.     Assessment & Plan:  1. Routine general medical examination at a health care facility - Continue to work on diet and exercise  - Follow up in one year for CPE  - Basic metabolic panel - CBC with Differential/Platelet - Hepatic function panel - Lipid panel - TSH  2. Tobacco use disorder - Congratulated on quitting smoking   3. Essential hypertension - Not at goal. Will refill Norvasc. Have him follow up in one month for recheck  - Basic metabolic panel - CBC with  Differential/Platelet - Hepatic function panel - Lipid panel - TSH - amLODipine (NORVASC) 5 MG tablet; Take 1 tablet (5 mg total) by mouth daily.  Dispense: 90 tablet; Refill: 0  4. Mixed hyperlipidemia - Consider increase in statin  - Basic metabolic panel - CBC with Differential/Platelet - Hepatic function panel - Lipid panel - TSH - simvastatin (ZOCOR) 10 MG tablet; Take 1 tablet (10 mg total) by mouth daily.  Dispense: 90 tablet; Refill: 3  5. Prostate cancer screening  - PSA  6. Need for Tdap vaccination  - Tdap vaccine greater than or equal to 7yo IM   Dorothyann Peng, NP

## 2017-06-13 DIAGNOSIS — H40003 Preglaucoma, unspecified, bilateral: Secondary | ICD-10-CM | POA: Diagnosis not present

## 2017-06-14 DIAGNOSIS — H40003 Preglaucoma, unspecified, bilateral: Secondary | ICD-10-CM | POA: Diagnosis not present

## 2017-06-16 ENCOUNTER — Encounter: Payer: Self-pay | Admitting: Neurology

## 2017-07-06 ENCOUNTER — Ambulatory Visit: Payer: BLUE CROSS/BLUE SHIELD | Admitting: Adult Health

## 2017-07-13 ENCOUNTER — Ambulatory Visit: Payer: BLUE CROSS/BLUE SHIELD | Admitting: Adult Health

## 2017-07-13 ENCOUNTER — Encounter: Payer: Self-pay | Admitting: Adult Health

## 2017-07-13 VITALS — BP 140/100 | Temp 98.1°F | Wt 216.0 lb

## 2017-07-13 DIAGNOSIS — I10 Essential (primary) hypertension: Secondary | ICD-10-CM

## 2017-07-13 NOTE — Progress Notes (Signed)
Subjective:    Patient ID: Scott Carson, male    DOB: 1970/10/08, 47 y.o.   MRN: [id number]  HPI  47 year old male who  has a past medical history of Arthritis, Eczema of hand, Hypertension, Low back pain, and Smoker.  He presents to the office today for follow up regarding hypertension. During his last visit, a month ago he was started on Norvasc for better control of BP. He has been monitoring his BP at home and has results consistent with this office visit. Denies any side effects of Norvasc   Review of Systems  Respiratory: Negative.   Cardiovascular: Negative.   Gastrointestinal: Negative.   Musculoskeletal: Negative.   Neurological: Negative.   All other systems reviewed and are negative.  Past Medical History:  Diagnosis Date  . Arthritis    BACK  . Eczema of hand   . Hypertension   . Low back pain   . Smoker     Social History   Socioeconomic History  . Marital status: Married    Spouse name: Kennyth Lose  . Number of children: 3  . Years of education: Not on file  . Highest education level: 12th grade  Occupational History  . Occupation: Designer, multimedia    Comment: Dance movement psychotherapist  Social Needs  . Financial resource strain: Not on file  . Food insecurity:    Worry: Not on file    Inability: Not on file  . Transportation needs:    Medical: Not on file    Non-medical: Not on file  Tobacco Use  . Smoking status: Former Smoker    Packs/day: 0.50    Types: Cigars    Start date: 04/30/2012    Last attempt to quit: 07/01/2013    Years since quitting: 4.0  . Smokeless tobacco: Never Used  Substance and Sexual Activity  . Alcohol use: No    Alcohol/week: 0.0 oz    Frequency: Never    Comment: occasionaly  . Drug use: Yes    Types: Marijuana    Comment: Quit smoking & marijuana 1 month ago  . Sexual activity: Not on file  Lifestyle  . Physical activity:    Days per week: Not on file    Minutes per session: Not on file  . Stress: Not on file    Relationships  . Social connections:    Talks on phone: Not on file    Gets together: Not on file    Attends religious service: Not on file    Active member of club or organization: Not on file    Attends meetings of clubs or organizations: Not on file    Relationship status: Not on file  . Intimate partner violence:    Fear of current or ex partner: Not on file    Emotionally abused: Not on file    Physically abused: Not on file    Forced sexual activity: Not on file  Other Topics Concern  . Not on file  Social History Narrative   Married   Alcohol use-no      Current Smoker   Occupation: Architect         Pt quit smoking cigarettes and cigars with Chantix. Does use cannibus. Drinks 2-3 cuos of coffee and 2-3 glasses of tea a day. No regular exercise, though is active at work. He is an only child.         Past Surgical History:  Procedure Laterality Date  . RESECTION DISTAL CLAVICAL  Left 08/13/2015   Procedure: RESECTION DISTAL CLAVICAL;  Surgeon: Ninetta Lights, MD;  Location: [facility];  Service: Orthopedics;  Laterality: Left;  . SHOULDER ARTHROSCOPY WITH ROTATOR CUFF REPAIR AND SUBACROMIAL DECOMPRESSION Left 08/13/2015   Procedure: LEFT SHOULDER ARTHROSCOPY DEBRIDEMENT ACROMIOPLASTY, DISTAL CLAVICAL EXCISION ROTATOR CUFF REPAIR ;  Surgeon: Ninetta Lights, MD;  Location: [facility];  Service: Orthopedics;  Laterality: Left;  . TONSILLECTOMY    . UPPER GASTROINTESTINAL ENDOSCOPY      Family History  Problem Relation Age of Onset  . Hypertension Mother   . Glaucoma Father   . Diabetes Maternal Grandmother   . Stroke Maternal Grandmother   . Colon cancer Paternal Grandfather   . Other Neg Hx        no family history of colon cancer  . Stomach cancer Neg Hx   . Esophageal cancer Neg Hx   . Prostate cancer Neg Hx     No Known Allergies  Current Outpatient Medications on File Prior to Visit  Medication Sig Dispense Refill  .  amLODipine (NORVASC) 5 MG tablet Take 1 tablet (5 mg total) by mouth daily. 90 tablet 0  . labetalol (NORMODYNE) 300 MG tablet TAKE 1 TABLET BY MOUTH TWICE A DAY 180 tablet 3  . nystatin cream (MYCOSTATIN) Apply 1 application topically 2 (two) times daily. 30 g 3  . simvastatin (ZOCOR) 10 MG tablet Take 1 tablet (10 mg total) by mouth daily. 90 tablet 3  . varenicline (CHANTIX) 1 MG tablet Take by mouth.     No current facility-administered medications on file prior to visit.     BP (!) 140/100   Temp 98.1 F (36.7 C) (Oral)   Wt 216 lb (98 kg)   BMI 30.13 kg/m       Objective:   Physical Exam  Constitutional: He is oriented to person, place, and time. He appears well-developed and well-nourished. No distress.  Cardiovascular: Normal rate, regular rhythm, normal heart sounds and intact distal pulses. Exam reveals no gallop and no friction rub.  No murmur heard. Pulmonary/Chest: Effort normal and breath sounds normal.  Neurological: He is alert and oriented to person, place, and time.  Skin: Skin is warm. He is not diaphoretic.  Psychiatric: He has a normal mood and affect. His behavior is normal. Judgment and thought content normal.  Nursing note and vitals reviewed.     Assessment & Plan:  1. Essential hypertension - Will have him increase Norvasc from 5 mg to 10 mg over the weekend and follow up with me early next week via mychart.   Dorothyann Peng, NP

## 2017-07-13 NOTE — Patient Instructions (Signed)
Please let me know what your blood pressures are over the weekend   Increase Norvasc to 10 mg daily

## 2017-07-31 ENCOUNTER — Ambulatory Visit (INDEPENDENT_AMBULATORY_CARE_PROVIDER_SITE_OTHER): Payer: BLUE CROSS/BLUE SHIELD | Admitting: Neurology

## 2017-07-31 ENCOUNTER — Other Ambulatory Visit: Payer: BLUE CROSS/BLUE SHIELD

## 2017-07-31 ENCOUNTER — Encounter: Payer: Self-pay | Admitting: Neurology

## 2017-07-31 VITALS — BP 128/90 | HR 91 | Ht 69.0 in | Wt 212.0 lb

## 2017-07-31 DIAGNOSIS — E785 Hyperlipidemia, unspecified: Secondary | ICD-10-CM | POA: Diagnosis not present

## 2017-07-31 DIAGNOSIS — I63431 Cerebral infarction due to embolism of right posterior cerebral artery: Secondary | ICD-10-CM

## 2017-07-31 NOTE — Progress Notes (Signed)
NEUROLOGY FOLLOW UP OFFICE NOTE  JOHNLUKE Carson [id number]  HISTORY OF PRESENT ILLNESS: Scott Carson is a 47 year old right-handed male with hypertension and former cigar smoker who follows up for stroke.  He is accompanied by his wife who supplements history.  UPDATE: He is taking ASA 81mg  daily, simvastatin 10mg  daily and labetolol. He is feeling well.  He has been taking his ASA but missed the past week because he hasn't had a chance to go to the pharmacy.  He does not exercise.  He drinks soda.  He underwent stroke workup: TTE with bubble study from 02/17/17 demonstrated EF 55-60% with mildly dilated ascending aorta and negative bubble study. CTA of head and neck from 02/20/17 were personally reviewed and demonstrated no significant large vessel stenosis or occlusion. 24 hour holter monitor from 03/02/17 did not show any arrhythmias. Lipid panel from 06/01/17 showed LDL 72.  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  He did not note any visual disturbance himself.  He followed up with the optometrist yesterday and vision is improved.  PAST MEDICAL HISTORY: Past Medical History:  Diagnosis Date  . Arthritis    BACK  . Eczema of hand   . Hypertension   . Low back pain   . Smoker     MEDICATIONS: Current Outpatient Medications on File Prior to Visit  Medication Sig Dispense Refill  . aspirin EC 81 MG tablet Take 81 mg by mouth daily.    Marland Kitchen amLODipine (NORVASC) 5 MG tablet Take 1 tablet (5 mg total) by mouth daily. 90 tablet 0  . labetalol (NORMODYNE) 300 MG tablet TAKE 1 TABLET BY MOUTH TWICE A DAY 180 tablet 3  . nystatin cream (MYCOSTATIN) Apply 1 application topically 2 (two) times daily. 30 g 3   . simvastatin (ZOCOR) 10 MG tablet Take 1 tablet (10 mg total) by mouth daily. 90 tablet 3  . varenicline (CHANTIX) 1 MG tablet Take by mouth.     No current facility-administered medications on file prior to visit.     ALLERGIES: No Known Allergies  FAMILY HISTORY: Family History  Problem Relation Age of Onset  . Hypertension Mother   . Glaucoma Father   . Diabetes Maternal Grandmother   . Stroke Maternal Grandmother   . Colon cancer Paternal Grandfather   . Other Neg Hx        no family history of colon cancer  . Stomach cancer Neg Hx   . Esophageal cancer Neg Hx   . Prostate cancer Neg Hx     SOCIAL HISTORY: Social History   Socioeconomic History  . Marital status: Married    Spouse name: Kennyth Lose  . Number of children: 3  . Years of education: Not on file  . Highest education level: 12th grade  Occupational History  . Occupation: Designer, multimedia    Comment: Dance movement psychotherapist  Social Needs  . Financial resource strain: Not on file  . Food insecurity:    Worry: Not on file    Inability: Not on file  . Transportation needs:    Medical: Not on file    Non-medical: Not on file  Tobacco Use  . Smoking status: Former Smoker  Packs/day: 0.50    Types: Cigars    Start date: 04/30/2012    Last attempt to quit: 07/01/2013    Years since quitting: 4.0  . Smokeless tobacco: Never Used  Substance and Sexual Activity  . Alcohol use: No    Alcohol/week: 0.0 oz    Frequency: Never    Comment: occasionaly  . Drug use: Yes    Types: Marijuana    Comment: Quit smoking & marijuana 1 month ago  . Sexual activity: Not on file  Lifestyle  . Physical activity:    Days per week: Not on file    Minutes per session: Not on file  . Stress: Not on file  Relationships  . Social connections:    Talks on phone: Not on file    Gets together: Not on file    Attends religious service: Not on file    Active member of club or organization: Not on file    Attends meetings of clubs  or organizations: Not on file    Relationship status: Not on file  . Intimate partner violence:    Fear of current or ex partner: Not on file    Emotionally abused: Not on file    Physically abused: Not on file    Forced sexual activity: Not on file  Other Topics Concern  . Not on file  Social History Narrative   Married   Alcohol use-no      Current Smoker   Occupation: Architect         Pt quit smoking cigarettes and cigars with Chantix. Does use cannibus. Drinks 2-3 cuos of coffee and 2-3 glasses of tea a day. No regular exercise, though is active at work. He is an only child.         REVIEW OF SYSTEMS: Constitutional: No fevers, chills, or sweats, no generalized fatigue, change in appetite Eyes: No visual changes, double vision, eye pain Ear, nose and throat: No hearing loss, ear pain, nasal congestion, sore throat Cardiovascular: No chest pain, palpitations Respiratory:  No shortness of breath at rest or with exertion, wheezes GastrointestinaI: No nausea, vomiting, diarrhea, abdominal pain, fecal incontinence Genitourinary:  No dysuria, urinary retention or frequency Musculoskeletal:  No neck pain, back pain Integumentary: No rash, pruritus, skin lesions Neurological: as above Psychiatric: No depression, insomnia, anxiety Endocrine: No palpitations, fatigue, diaphoresis, mood swings, change in appetite, change in weight, increased thirst Hematologic/Lymphatic:  No purpura, petechiae. Allergic/Immunologic: no itchy/runny eyes, nasal congestion, recent allergic reactions, rashes  PHYSICAL EXAM: Vitals:   07/31/17 1601  BP: 128/90  Pulse: 91  SpO2: 98%   General: No acute distress.  Patient appears well-groomed.  normal body habitus. Head:  Normocephalic/atraumatic Eyes:  Fundi examined but not visualized Neck: supple, no paraspinal tenderness, full range of motion Heart:  Regular rate and rhythm Lungs:  Clear to auscultation bilaterally Back: No paraspinal  tenderness Neurological Exam: alert and oriented to person, place, and time. Attention span and concentration intact, recent and remote memory intact, fund of knowledge intact.  Speech fluent and not dysarthric, language intact.  CN II-XII intact. Bulk and tone normal, muscle strength 5-/5 right deltoid due to rotator cuff tear, otherwise 5/5 throughout.  Sensation to temperature and vibration intact.  Deep tendon reflexes 2+ throughout.  Finger to nose testing intact.  Gait normal, Romberg negative.  IMPRESSION: Right PCA territory infarct, likely embolic Hypertension  PLAN: 1.  Continue ASA 81mg  daily for secondary stroke prevention.  Advised to take daily as  directed. 2.  Continue simvastatin 10mg  daily (LDL goal less than 70).  Recheck lipid panel. 3.  Continue blood pressure control 4.  Advised to start routine cardiovascular exercise 5.  Advised to start Mediterranean diet, increase water, discontinue soda 6.  Follow up in 6 months.  19 minutes spent face to face with patient, over 50% spent discussing management.  Metta Clines, DO  CC:  Dorothyann Peng, NP

## 2017-07-31 NOTE — Patient Instructions (Addendum)
1.  Continue aspirin 81mg  daily 2.  Continue simvastatin 10mg  daily.  Check lipid panel 3.  Continue blood pressure control 4. Start routine cardiovascular exercise (goal of 30 minutes 4 to 5 days a week) 5.  Mediterranean diet (see below) 6. Increase water intake, stop drinking soda 7.  Follow up in 6 months.  Your provider has requested that you have labwork completed today. Please go to [facility] ([id number]) on the second floor of this building before leaving the office today. You do not need to check in. If you are not called within 15 minutes please check with the front desk.     Mediterranean Diet A Mediterranean diet refers to food and lifestyle choices that are based on the traditions of countries located on the [employer]. This way of eating has been shown to help prevent certain conditions and improve outcomes for people who have chronic diseases, like kidney disease and heart disease. What are tips for following this plan? Lifestyle  Cook and eat meals together with your family, when possible.  Drink enough fluid to keep your urine clear or pale yellow.  Be physically active every day. This includes: ? Aerobic exercise like running or swimming. ? Leisure activities like gardening, walking, or housework.  Get 7-8 hours of sleep each night.  If recommended by your health care provider, drink red wine in moderation. This means 1 glass a day for nonpregnant women and 2 glasses a day for men. A glass of wine equals 5 oz (150 mL). Reading food labels  Check the serving size of packaged foods. For foods such as rice and pasta, the serving size refers to the amount of cooked product, not dry.  Check the total fat in packaged foods. Avoid foods that have saturated fat or trans fats.  Check the ingredients list for added sugars, such as corn syrup. Shopping  At the grocery store, buy most of your food from the areas near the walls of the store. This  includes: ? Fresh fruits and vegetables (produce). ? Grains, beans, nuts, and seeds. Some of these may be available in unpackaged forms or large amounts (in bulk). ? Fresh seafood. ? Poultry and eggs. ? Low-fat dairy products.  Buy whole ingredients instead of prepackaged foods.  Buy fresh fruits and vegetables in-season from local farmers markets.  Buy frozen fruits and vegetables in resealable bags.  If you do not have access to quality fresh seafood, buy precooked frozen shrimp or canned fish, such as tuna, salmon, or sardines.  Buy small amounts of raw or cooked vegetables, salads, or olives from the deli or salad bar at your store.  Stock your pantry so you always have certain foods on hand, such as olive oil, canned tuna, canned tomatoes, rice, pasta, and beans. Cooking  Cook foods with extra-virgin olive oil instead of using butter or other vegetable oils.  Have meat as a side dish, and have vegetables or grains as your main dish. This means having meat in small portions or adding small amounts of meat to foods like pasta or stew.  Use beans or vegetables instead of meat in common dishes like chili or lasagna.  Experiment with different cooking methods. Try roasting or broiling vegetables instead of steaming or sauteing them.  Add frozen vegetables to soups, stews, pasta, or rice.  Add nuts or seeds for added healthy fat at each meal. You can add these to yogurt, salads, or vegetable dishes.  Marinate fish or vegetables using  olive oil, lemon juice, garlic, and fresh herbs. Meal planning  Plan to eat 1 vegetarian meal one day each week. Try to work up to 2 vegetarian meals, if possible.  Eat seafood 2 or more times a week.  Have healthy snacks readily available, such as: ? Vegetable sticks with hummus. ? [place] yogurt. ? Fruit and nut trail mix.  Eat balanced meals throughout the week. This includes: ? Fruit: 2-3 servings a day ? Vegetables: 4-5 servings a  day ? Low-fat dairy: 2 servings a day ? Fish, poultry, or lean meat: 1 serving a day ? Beans and legumes: 2 or more servings a week ? Nuts and seeds: 1-2 servings a day ? Whole grains: 6-8 servings a day ? Extra-virgin olive oil: 3-4 servings a day  Limit red meat and sweets to only a few servings a month What are my food choices?  Mediterranean diet ? Recommended ? Grains: Whole-grain pasta. Brown rice. Bulgar wheat. Polenta. Couscous. Whole-wheat bread. [place]. ? Vegetables: Artichokes. Beets. Broccoli. Cabbage. Carrots. Eggplant. Green beans. Chard. Kale. Spinach. Onions. Leeks. Peas. Squash. Tomatoes. Peppers. Radishes. ? Fruits: Apples. Apricots. Avocado. Berries. Bananas. Cherries. Dates. Figs. Grapes. Lemons. Melon. Oranges. Peaches. Plums. Pomegranate. ? Meats and other protein foods: Beans. Almonds. Sunflower seeds. Pine nuts. Peanuts. [place]. Salmon. Scallops. Shrimp. [place]. Tilapia. Clams. Oysters. Eggs. ? Dairy: Low-fat milk. Cheese. Greek yogurt. ? Beverages: Water. Red wine. Herbal tea. ? Fats and oils: Extra virgin olive oil. Avocado oil. Grape seed oil. ? Sweets and desserts: [place] yogurt with honey. Baked apples. Poached pears. Trail mix. ? Seasoning and other foods: Basil. Cilantro. Coriander. Cumin. Mint. Parsley. Sage. Rosemary. Tarragon. Garlic. Oregano. Thyme. Pepper. Balsalmic vinegar. Tahini. Hummus. Tomato sauce. Olives. Mushrooms. ? Limit these ? Grains: Prepackaged pasta or rice dishes. Prepackaged cereal with added sugar. ? Vegetables: Deep fried potatoes (french fries). ? Fruits: Fruit canned in syrup. ? Meats and other protein foods: Beef. Pork. Lamb. Poultry with skin. Hot dogs. Berniece Salines. ? Dairy: Ice cream. Sour cream. Whole milk. ? Beverages: Juice. Sugar-sweetened soft drinks. Beer. Liquor and spirits. ? Fats and oils: Butter. Canola oil. Vegetable oil. Beef fat (tallow). Lard. ? Sweets and desserts: Cookies. Cakes. Pies. Candy. ? Seasoning and other  foods: Mayonnaise. Premade sauces and marinades. ? The items listed may not be a complete list. Talk with your dietitian about what dietary choices are right for you. Summary  The Mediterranean diet includes both food and lifestyle choices.  Eat a variety of fresh fruits and vegetables, beans, nuts, seeds, and whole grains.  Limit the amount of red meat and sweets that you eat.  Talk with your health care provider about whether it is safe for you to drink red wine in moderation. This means 1 glass a day for nonpregnant women and 2 glasses a day for men. A glass of wine equals 5 oz (150 mL). This information is not intended to replace advice given to you by your health care provider. Make sure you discuss any questions you have with your health care provider. Document Released: 10/01/2015 Document Revised: 11/03/2015 Document Reviewed: 10/01/2015 Elsevier Interactive Patient Education  [employer].

## 2017-08-01 ENCOUNTER — Telehealth: Payer: Self-pay

## 2017-08-01 LAB — LIPID PANEL
Cholesterol: 116 mg/dL (ref <200)
HDL: 48 mg/dL (ref >40)
LDL Cholesterol (Calc): 52 mg/dL (calc)
NON-HDL CHOLESTEROL (CALC): 68 mg/dL (ref <130)
Total CHOL/HDL Ratio: 2.4 (calc) (ref <5.0)
Triglycerides: 77 mg/dL (ref <150)

## 2017-08-01 NOTE — Telephone Encounter (Signed)
-----   Message from Pieter Partridge, DO sent at 08/01/2017 11:49 AM EDT ----- Cholesterol looks okay.  Continue simvastatin.

## 2017-08-01 NOTE — Telephone Encounter (Signed)
Called and spoke with Pt, advsd him of lab results

## 2017-08-09 ENCOUNTER — Telehealth: Payer: Self-pay | Admitting: Adult Health

## 2017-08-09 NOTE — Telephone Encounter (Signed)
Spoke with [facility] staff regarding Amlodipine. 90 day supply already filled and available for pick up. Pharmacy states that Labetalol is also available for pick up at this time. Left message on pt's voicemail informing pt that requested medication was available for pick up.

## 2017-08-09 NOTE — Telephone Encounter (Signed)
Copied from [place] [id number]. Topic: Quick Communication - Rx Refill/Question >> Aug 09, 2017 11:02 AM Neva Seat wrote: amLODipine (NORVASC) 5 MG tablet  Needing refills - out of Rx  [id number] [address], [place] - [address] [place] [address] Phone: [phone] Fax: [phone]

## 2017-08-10 ENCOUNTER — Ambulatory Visit: Payer: BLUE CROSS/BLUE SHIELD | Admitting: Neurology

## 2018-02-07 NOTE — Progress Notes (Signed)
NEUROLOGY FOLLOW UP OFFICE NOTE  Scott Carson [id number]  HISTORY OF PRESENT ILLNESS: Scott Carson is a 47 year old right-handed male with hypertension and smoker who follows up for stroke.  He is accompanied by his wife who supplements history.  UPDATE:  Current medications include ASA 81 mg daily, simvastatin 10 mg daily, amlodipine and labetalol  LDL from 07/31/2017 was 52.  He is doing well.  He started smoking again but says he will try to quit.  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  He did not note any visual disturbance himself.  He followed up with the optometrist yesterday and vision is improved.  He underwent stroke workup: TTE with bubble study from 02/17/17 demonstrated EF 55-60% with mildly dilated ascending aorta and negative bubble study. CTA of head and neck from 02/20/17 were personally reviewed and demonstrated no significant large vessel stenosis or occlusion. 24 hour holter monitor from 03/02/17 did not show any arrhythmias.  PAST MEDICAL HISTORY: Past Medical History:  Diagnosis Date  . Arthritis    BACK  . Eczema of hand   . Hypertension   . Low back pain   . Smoker     MEDICATIONS: Current Outpatient Medications on File Prior to Visit  Medication Sig Dispense Refill  . amLODipine (NORVASC) 5 MG tablet Take 1 tablet (5 mg total) by mouth daily. 90 tablet 0  . aspirin EC 81 MG tablet Take 81 mg by mouth daily.    Marland Kitchen labetalol (NORMODYNE) 300 MG tablet TAKE 1 TABLET BY MOUTH TWICE A DAY 180 tablet 3  . nystatin cream (MYCOSTATIN) Apply 1 application topically 2 (two) times daily. 30 g 3  . simvastatin (ZOCOR) 10 MG tablet Take 1 tablet (10 mg total) by mouth daily. 90  tablet 3  . varenicline (CHANTIX) 1 MG tablet Take by mouth.     No current facility-administered medications on file prior to visit.     ALLERGIES: No Known Allergies  FAMILY HISTORY: Family History  Problem Relation Age of Onset  . Hypertension Mother   . Glaucoma Father   . Diabetes Maternal Grandmother   . Stroke Maternal Grandmother   . Colon cancer Paternal Grandfather   . Other Neg Hx        no family history of colon cancer  . Stomach cancer Neg Hx   . Esophageal cancer Neg Hx   . Prostate cancer Neg Hx    SOCIAL HISTORY: Social History   Socioeconomic History  . Marital status: Married    Spouse name: Scott Carson  . Number of children: 3  . Years of education: Not on file  . Highest education level: 12th grade  Occupational History  . Occupation: Designer, multimedia    Comment: Dance movement psychotherapist  Social Needs  . Financial resource strain: Not on file  . Food insecurity:    Worry: Not on file    Inability: Not on file  . Transportation needs:    Medical: Not on file    Non-medical: Not on file  Tobacco Use  . Smoking status: Former Smoker    Packs/day: 0.50    Types: Cigars    Start date: 04/30/2012    Last attempt to quit: 07/01/2013  Years since quitting: 4.6  . Smokeless tobacco: Never Used  Substance and Sexual Activity  . Alcohol use: No    Alcohol/week: 0.0 standard drinks    Frequency: Never    Comment: occasionaly  . Drug use: Yes    Types: Marijuana    Comment: Quit smoking & marijuana 1 month ago  . Sexual activity: Not on file  Lifestyle  . Physical activity:    Days per week: Not on file    Minutes per session: Not on file  . Stress: Not on file  Relationships  . Social connections:    Talks on phone: Not on file    Gets together: Not on file    Attends religious service: Not on file    Active member of club or organization: Not on file    Attends meetings of clubs or organizations: Not on file    Relationship status: Not on file  .  Intimate partner violence:    Fear of current or ex partner: Not on file    Emotionally abused: Not on file    Physically abused: Not on file    Forced sexual activity: Not on file  Other Topics Concern  . Not on file  Social History Narrative   Married   Alcohol use-no      Current Smoker   Occupation: Architect         Pt quit smoking cigarettes and cigars with Chantix. Does use cannibus. Drinks 2-3 cuos of coffee and 2-3 glasses of tea a day. No regular exercise, though is active at work. He is an only child.         REVIEW OF SYSTEMS: Constitutional: No fevers, chills, or sweats, no generalized fatigue, change in appetite Eyes: No visual changes, double vision, eye pain Ear, nose and throat: No hearing loss, ear pain, nasal congestion, sore throat Cardiovascular: No chest pain, palpitations Respiratory:  No shortness of breath at rest or with exertion, wheezes GastrointestinaI: No nausea, vomiting, diarrhea, abdominal pain, fecal incontinence Genitourinary:  No dysuria, urinary retention or frequency Musculoskeletal:  No neck pain, back pain Integumentary: No rash, pruritus, skin lesions Neurological: as above Psychiatric: No depression, insomnia, anxiety Endocrine: No palpitations, fatigue, diaphoresis, mood swings, change in appetite, change in weight, increased thirst Hematologic/Lymphatic:  No purpura, petechiae. Allergic/Immunologic: no itchy/runny eyes, nasal congestion, recent allergic reactions, rashes  PHYSICAL EXAM: Blood pressure (!) 118/92, pulse 96, height 5\' 7"  (1.702 m), weight 209 lb (94.8 kg), SpO2 98 %. General: No acute distress.  Patient appears well-groomed.   Head:  Normocephalic/atraumatic Eyes:  Fundi examined but not visualized Neck: supple, no paraspinal tenderness, full range of motion Heart:  Regular rate and rhythm Lungs:  Clear to auscultation bilaterally Back: No paraspinal tenderness Neurological Exam: alert and oriented to person,  place, and time. Attention span and concentration intact, recent and remote memory intact, fund of knowledge intact.  Speech fluent and not dysarthric, language intact.  CN II-XII intact. Bulk and tone normal, muscle strength 5/5 throughout.  Sensation to light touch  intact.  Deep tendon reflexes 2+ throughout, toes downgoing.  Finger to nose testing intact.  Gait normal, Romberg negative.  IMPRESSION: 1.  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 2.  Hypertension 3. Hyperlipidemia 4.  Tobacco use disorder  PLAN: 1.  Continue ASA 81 mg daily for secondary stroke prevention. 2.  Continue simvastatin 10 mg daily (LDL at goal of less than 70) 3.  Continue blood pressure control  4.  Lifestyle modification: Mediterranean diet, exercise 5.  Tobacco cessation counseling (CPT 99406):  Tobacco with history of stroke  - Currently smoking 0.5 packs/day   - Patient was informed of the dangers of tobacco abuse including stroke, cancer, and MI, as well as benefits of tobacco cessation. - Patient is willing to quit at this time. - Approximately 4 mins were spent counseling patient cessation techniques. We discussed various methods to help quit smoking, including deciding on a date to quit, joining a support group, pharmacological agents- nicotine gum/patch/lozenges, chantix.  - I will reassess his progress if follow up visit is warranted. 6.  Follow-up as needed  15 minutes spent face to face with patient, over 50% spent discussing management.  Metta Clines, DO  CC: Sallee Provencal, NP

## 2018-02-08 ENCOUNTER — Ambulatory Visit: Payer: BLUE CROSS/BLUE SHIELD | Admitting: Neurology

## 2018-02-08 ENCOUNTER — Encounter: Payer: Self-pay | Admitting: Neurology

## 2018-02-08 VITALS — BP 118/92 | HR 96 | Ht 67.0 in | Wt 209.0 lb

## 2018-02-08 DIAGNOSIS — I63431 Cerebral infarction due to embolism of right posterior cerebral artery: Secondary | ICD-10-CM

## 2018-02-08 DIAGNOSIS — F17200 Nicotine dependence, unspecified, uncomplicated: Secondary | ICD-10-CM

## 2018-02-08 DIAGNOSIS — E785 Hyperlipidemia, unspecified: Secondary | ICD-10-CM

## 2018-02-08 DIAGNOSIS — I10 Essential (primary) hypertension: Secondary | ICD-10-CM

## 2018-02-08 NOTE — Patient Instructions (Signed)
1.  Continue aspirin 81mg  daily for secondary stroke prevention 2.  Continue simvastatin 10mg  daily 3.  Continue blood pressure medication 4.  Mediterranean diet (see below) 5.  Try to work on quitting smoking again 6.  Start routine exercise (cardiovascular exercise at least 45 minutes 3 days a week or 30 minutes 5 days a week) 7.  Follow up as needed   Mediterranean Diet A Mediterranean diet refers to food and lifestyle choices that are based on the traditions of countries located on the [employer]. This way of eating has been shown to help prevent certain conditions and improve outcomes for people who have chronic diseases, like kidney disease and heart disease. What are tips for following this plan? Lifestyle  Cook and eat meals together with your family, when possible.  Drink enough fluid to keep your urine clear or pale yellow.  Be physically active every day. This includes: ? Aerobic exercise like running or swimming. ? Leisure activities like gardening, walking, or housework.  Get 7-8 hours of sleep each night.  If recommended by your health care provider, drink red wine in moderation. This means 1 glass a day for nonpregnant women and 2 glasses a day for men. A glass of wine equals 5 oz (150 mL). Reading food labels   Check the serving size of packaged foods. For foods such as rice and pasta, the serving size refers to the amount of cooked product, not dry.  Check the total fat in packaged foods. Avoid foods that have saturated fat or trans fats.  Check the ingredients list for added sugars, such as corn syrup. Shopping  At the grocery store, buy most of your food from the areas near the walls of the store. This includes: ? Fresh fruits and vegetables (produce). ? Grains, beans, nuts, and seeds. Some of these may be available in unpackaged forms or large amounts (in bulk). ? Fresh seafood. ? Poultry and eggs. ? Low-fat dairy products.  Buy whole ingredients  instead of prepackaged foods.  Buy fresh fruits and vegetables in-season from local farmers markets.  Buy frozen fruits and vegetables in resealable bags.  If you do not have access to quality fresh seafood, buy precooked frozen shrimp or canned fish, such as tuna, salmon, or sardines.  Buy small amounts of raw or cooked vegetables, salads, or olives from the deli or salad bar at your store.  Stock your pantry so you always have certain foods on hand, such as olive oil, canned tuna, canned tomatoes, rice, pasta, and beans. Cooking  Cook foods with extra-virgin olive oil instead of using butter or other vegetable oils.  Have meat as a side dish, and have vegetables or grains as your main dish. This means having meat in small portions or adding small amounts of meat to foods like pasta or stew.  Use beans or vegetables instead of meat in common dishes like chili or lasagna.  Experiment with different cooking methods. Try roasting or broiling vegetables instead of steaming or sauteing them.  Add frozen vegetables to soups, stews, pasta, or rice.  Add nuts or seeds for added healthy fat at each meal. You can add these to yogurt, salads, or vegetable dishes.  Marinate fish or vegetables using olive oil, lemon juice, garlic, and fresh herbs. Meal planning   Plan to eat 1 vegetarian meal one day each week. Try to work up to 2 vegetarian meals, if possible.  Eat seafood 2 or more times a week.  Have healthy  snacks readily available, such as: ? Vegetable sticks with hummus. ? [place] yogurt. ? Fruit and nut trail mix.  Eat balanced meals throughout the week. This includes: ? Fruit: 2-3 servings a day ? Vegetables: 4-5 servings a day ? Low-fat dairy: 2 servings a day ? Fish, poultry, or lean meat: 1 serving a day ? Beans and legumes: 2 or more servings a week ? Nuts and seeds: 1-2 servings a day ? Whole grains: 6-8 servings a day ? Extra-virgin olive oil: 3-4 servings a  day  Limit red meat and sweets to only a few servings a month What are my food choices?  Mediterranean diet ? Recommended ? Grains: Whole-grain pasta. Brown rice. Bulgar wheat. Polenta. Couscous. Whole-wheat bread. [place]. ? Vegetables: Artichokes. Beets. Broccoli. Cabbage. Carrots. Eggplant. Green beans. Chard. Kale. Spinach. Onions. Leeks. Peas. Squash. Tomatoes. Peppers. Radishes. ? Fruits: Apples. Apricots. Avocado. Berries. Bananas. Cherries. Dates. Figs. Grapes. Lemons. Melon. Oranges. Peaches. Plums. Pomegranate. ? Meats and other protein foods: Beans. Almonds. Sunflower seeds. Pine nuts. Peanuts. [place]. Salmon. Scallops. Shrimp. [place]. Tilapia. Clams. Oysters. Eggs. ? Dairy: Low-fat milk. Cheese. Greek yogurt. ? Beverages: Water. Red wine. Herbal tea. ? Fats and oils: Extra virgin olive oil. Avocado oil. Grape seed oil. ? Sweets and desserts: [place] yogurt with honey. Baked apples. Poached pears. Trail mix. ? Seasoning and other foods: Basil. Cilantro. Coriander. Cumin. Mint. Parsley. Sage. Rosemary. Tarragon. Garlic. Oregano. Thyme. Pepper. Balsalmic vinegar. Tahini. Hummus. Tomato sauce. Olives. Mushrooms. ? Limit these ? Grains: Prepackaged pasta or rice dishes. Prepackaged cereal with added sugar. ? Vegetables: Deep fried potatoes (french fries). ? Fruits: Fruit canned in syrup. ? Meats and other protein foods: Beef. Pork. Lamb. Poultry with skin. Hot dogs. [place] Salines. ? Dairy: Ice cream. Sour cream. Whole milk. ? Beverages: Juice. Sugar-sweetened soft drinks. Beer. Liquor and spirits. ? Fats and oils: Butter. Canola oil. Vegetable oil. Beef fat (tallow). Lard. ? Sweets and desserts: Cookies. Cakes. Pies. Candy. ? Seasoning and other foods: Mayonnaise. Premade sauces and marinades. ? The items listed may not be a complete list. Talk with your dietitian about what dietary choices are right for you. Summary  The Mediterranean diet includes both food and lifestyle  choices.  Eat a variety of fresh fruits and vegetables, beans, nuts, seeds, and whole grains.  Limit the amount of red meat and sweets that you eat.  Talk with your health care provider about whether it is safe for you to drink red wine in moderation. This means 1 glass a day for nonpregnant women and 2 glasses a day for men. A glass of wine equals 5 oz (150 mL). This information is not intended to replace advice given to you by your health care provider. Make sure you discuss any questions you have with your health care provider. Document Released: 10/01/2015 Document Revised: 11/03/2015 Document Reviewed: 10/01/2015 Elsevier Interactive Patient Education  2019 [employer].

## 2018-02-15 DIAGNOSIS — H40013 Open angle with borderline findings, low risk, bilateral: Secondary | ICD-10-CM | POA: Diagnosis not present

## 2018-02-18 DIAGNOSIS — H40013 Open angle with borderline findings, low risk, bilateral: Secondary | ICD-10-CM | POA: Insufficient documentation

## 2018-02-21 DIAGNOSIS — I639 Cerebral infarction, unspecified: Secondary | ICD-10-CM

## 2018-02-21 HISTORY — DX: Cerebral infarction, unspecified: I63.9

## 2018-02-26 DIAGNOSIS — H524 Presbyopia: Secondary | ICD-10-CM | POA: Diagnosis not present

## 2018-02-26 DIAGNOSIS — H52203 Unspecified astigmatism, bilateral: Secondary | ICD-10-CM | POA: Diagnosis not present

## 2018-02-26 DIAGNOSIS — H5213 Myopia, bilateral: Secondary | ICD-10-CM | POA: Diagnosis not present

## 2018-02-26 DIAGNOSIS — H40013 Open angle with borderline findings, low risk, bilateral: Secondary | ICD-10-CM | POA: Diagnosis not present

## 2018-06-08 ENCOUNTER — Ambulatory Visit (INDEPENDENT_AMBULATORY_CARE_PROVIDER_SITE_OTHER): Payer: BLUE CROSS/BLUE SHIELD | Admitting: Adult Health

## 2018-06-08 ENCOUNTER — Encounter: Payer: BLUE CROSS/BLUE SHIELD | Admitting: Adult Health

## 2018-06-08 ENCOUNTER — Other Ambulatory Visit: Payer: Self-pay

## 2018-06-08 ENCOUNTER — Encounter: Payer: Self-pay | Admitting: Adult Health

## 2018-06-08 VITALS — Wt 205.0 lb

## 2018-06-08 DIAGNOSIS — F321 Major depressive disorder, single episode, moderate: Secondary | ICD-10-CM | POA: Diagnosis not present

## 2018-06-08 DIAGNOSIS — K2100 Gastro-esophageal reflux disease with esophagitis, without bleeding: Secondary | ICD-10-CM

## 2018-06-08 DIAGNOSIS — R630 Anorexia: Secondary | ICD-10-CM | POA: Diagnosis not present

## 2018-06-08 DIAGNOSIS — F17200 Nicotine dependence, unspecified, uncomplicated: Secondary | ICD-10-CM | POA: Diagnosis not present

## 2018-06-08 MED ORDER — OMEPRAZOLE 20 MG PO CPDR: 20.0000 mg | DELAYED_RELEASE_CAPSULE | Freq: Every day | ORAL | 3 refills | Status: DC

## 2018-06-08 MED ORDER — BUPROPION HCL ER (XL) 150 MG PO TB24: 150.0000 mg | ORAL_TABLET | Freq: Every day | ORAL | 0 refills | Status: DC

## 2018-06-08 NOTE — Progress Notes (Signed)
Virtual Visit via Video Note  I connected with Scott Carson on 06/08/18 at  3:00 PM EDT by a video enabled telemedicine application and verified that I am speaking with the correct person using two identifiers.  Location patient: home Location provider:work or home office Persons participating in the virtual visit: patient, provider  I discussed the limitations of evaluation and management by telemedicine and the availability of in person appointments. The patient expressed understanding and agreed to proceed.   HPI: This is a 48 year old male who is being evaluated today for the plaint of "loss of appetite".  He reports that he has had a loss of appetite on and off for the last 11 months to a year.  Per patient "when I eat something it just feels like it fills me up and I do not want to eat the rest of the day".  He denies any nausea vomiting diarrhea or abdominal pain.  He denies waking up feeling like he has a sour taste in his mouth and does not have any burning in his throat.  He does take Prilosec and needs a refill of this medication  He is trying to eat healthy when he does eat and is trying to eat small meals.  Today he had a breakfast sandwich around noon says that he is not hungry and will probably eat around 7 or 8:00 tonight.  When asked about depression he states that he is depressed and this is due to life stressors.  He denies any suicidal ideation.  He has started smoking again and is up to a pack a day   ROS: See pertinent positives and negatives per HPI.  Past Medical History:  Diagnosis Date  . Arthritis    BACK  . Eczema of hand   . Hypertension   . Low back pain   . Smoker     Past Surgical History:  Procedure Laterality Date  . RESECTION DISTAL CLAVICAL Left 08/13/2015   Procedure: RESECTION DISTAL CLAVICAL;  Surgeon: Ninetta Lights, MD;  Location: [place];  Service: Orthopedics;  Laterality: Left;  . SHOULDER ARTHROSCOPY WITH ROTATOR CUFF  REPAIR AND SUBACROMIAL DECOMPRESSION Left 08/13/2015   Procedure: LEFT SHOULDER ARTHROSCOPY DEBRIDEMENT ACROMIOPLASTY, DISTAL CLAVICAL EXCISION ROTATOR CUFF REPAIR ;  Surgeon: Ninetta Lights, MD;  Location: [place];  Service: Orthopedics;  Laterality: Left;  . TONSILLECTOMY    . UPPER GASTROINTESTINAL ENDOSCOPY      Family History  Problem Relation Age of Onset  . Hypertension Mother   . Glaucoma Father   . Diabetes Maternal Grandmother   . Stroke Maternal Grandmother   . Colon cancer Paternal Grandfather   . Other Neg Hx        no family history of colon cancer  . Stomach cancer Neg Hx   . Esophageal cancer Neg Hx   . Prostate cancer Neg Hx       Current Outpatient Medications:  .  amLODipine (NORVASC) 5 MG tablet, Take 1 tablet (5 mg total) by mouth daily., Disp: 90 tablet, Rfl: 0 .  aspirin EC 81 MG tablet, Take 81 mg by mouth daily., Disp: , Rfl:  .  labetalol (NORMODYNE) 300 MG tablet, TAKE 1 TABLET BY MOUTH TWICE A DAY, Disp: 180 tablet, Rfl: 3 .  Multiple Vitamins-Minerals (CENTRUM ADULTS PO), Take by mouth., Disp: , Rfl:  .  simvastatin (ZOCOR) 10 MG tablet, Take 1 tablet (10 mg total) by mouth daily., Disp: 90 tablet, Rfl: 3 .  buPROPion (WELLBUTRIN XL) 150 MG 24 hr tablet, Take 1 tablet (150 mg total) by mouth daily., Disp: 90 tablet, Rfl: 0 .  omeprazole (PRILOSEC) 20 MG capsule, Take 1 capsule (20 mg total) by mouth daily., Disp: 30 capsule, Rfl: 3  EXAM:  VITALS per patient if applicable:  GENERAL: alert, oriented, appears well and in no acute distress  HEENT: atraumatic, conjunttiva clear, no obvious abnormalities on inspection of external nose and ears  NECK: normal movements of the head and neck  LUNGS: on inspection no signs of respiratory distress, breathing rate appears normal, no obvious gross SOB, gasping or wheezing  CV: no obvious cyanosis  MS: moves all visible extremities without noticeable abnormality  PSYCH/NEURO: pleasant  and cooperative, no obvious depression or anxiety, speech and thought processing grossly intact  ASSESSMENT AND PLAN:  Discussed the following assessment and plan:  PHQ 9 score of 16.  His loss of appetite is likely due to depression.  Both at length about various modalities, he is okay with trialing Wellbutrin and starting off at 150 mg.  Hopefully this will help him with the quitting smoking aspect as well.  We will have him follow-up in 30 days or sooner if needed.  He was advised if he has any suicidal ideation then he needs to be seen in the emergency room right away.  Loss of appetite  Depression, major, single episode, moderate (HCC) - Plan: buPROPion (WELLBUTRIN XL) 150 MG 24 hr tablet  Gastroesophageal reflux disease with esophagitis - Plan: omeprazole (PRILOSEC) 20 MG capsule  Tobacco use disorder - Plan: buPROPion (WELLBUTRIN XL) 150 MG 24 hr tablet   I discussed the assessment and treatment plan with the patient. The patient was provided an opportunity to ask questions and all were answered. The patient agreed with the plan and demonstrated an understanding of the instructions.   The patient was advised to call back or seek an in-person evaluation if the symptoms worsen or if the condition fails to improve as anticipated.   Dorothyann Peng, NP

## 2018-06-12 ENCOUNTER — Other Ambulatory Visit: Payer: Self-pay | Admitting: Adult Health

## 2018-06-12 DIAGNOSIS — E782 Mixed hyperlipidemia: Secondary | ICD-10-CM

## 2018-06-12 DIAGNOSIS — I10 Essential (primary) hypertension: Secondary | ICD-10-CM

## 2018-06-12 MED ORDER — SIMVASTATIN 10 MG PO TABS: 10.0000 mg | ORAL_TABLET | Freq: Every day | ORAL | 0 refills | Status: DC

## 2018-06-12 NOTE — Telephone Encounter (Signed)
Sent to the pharmacy by e-scribe. 

## 2018-06-13 ENCOUNTER — Other Ambulatory Visit: Payer: Self-pay | Admitting: Adult Health

## 2018-06-13 DIAGNOSIS — E782 Mixed hyperlipidemia: Secondary | ICD-10-CM

## 2018-06-13 NOTE — Telephone Encounter (Signed)
Denied.  Filled on 06/12/2018.  Message sent to the pharmacy to check file.

## 2018-06-24 ENCOUNTER — Other Ambulatory Visit: Payer: Self-pay | Admitting: Adult Health

## 2018-06-24 DIAGNOSIS — E782 Mixed hyperlipidemia: Secondary | ICD-10-CM

## 2018-06-25 NOTE — Telephone Encounter (Signed)
DENIED.  FILLED ON 06/12/2018 FOR 90 DAYS.  MESSAGE SENT TO THE PHARMACY.

## 2018-07-20 ENCOUNTER — Other Ambulatory Visit: Payer: Self-pay

## 2018-07-20 ENCOUNTER — Ambulatory Visit (INDEPENDENT_AMBULATORY_CARE_PROVIDER_SITE_OTHER): Payer: BLUE CROSS/BLUE SHIELD | Admitting: Adult Health

## 2018-07-20 ENCOUNTER — Encounter: Payer: Self-pay | Admitting: Adult Health

## 2018-07-20 DIAGNOSIS — F17200 Nicotine dependence, unspecified, uncomplicated: Secondary | ICD-10-CM | POA: Diagnosis not present

## 2018-07-20 DIAGNOSIS — K2100 Gastro-esophageal reflux disease with esophagitis, without bleeding: Secondary | ICD-10-CM

## 2018-07-20 DIAGNOSIS — F325 Major depressive disorder, single episode, in full remission: Secondary | ICD-10-CM

## 2018-07-20 MED ORDER — OMEPRAZOLE 20 MG PO CPDR: 20.0000 mg | DELAYED_RELEASE_CAPSULE | Freq: Every day | ORAL | 1 refills | Status: DC

## 2018-07-20 NOTE — Progress Notes (Signed)
Virtual Visit via Video Note  I connected with Scott Carson on 07/20/18 at  2:00 PM EDT by a video enabled telemedicine application and verified that I am speaking with the correct person using two identifiers.  Location patient: home Location provider:work or home office Persons participating in the virtual visit: patient, provider  I discussed the limitations of evaluation and management by telemedicine and the availability of in person appointments. The patient expressed understanding and agreed to proceed.   HPI: 48 year old male who is being a lot evaluated today for 30-day follow-up regarding loss of appetite and depression.  His PHQ 9 score was 16.  He was trialed on Wellbutrin 150 mg  Today in the office he reports that he tried Wellbutrin but quit taking it after the first dose as it causes nausea and vomiting.  Today he feels as though "I still have life stressors but my depression is not what it used to be a month ago, I am feeling really good and my appetite is back".  He has started making lifestyle changes and has cut back on smoking, eating healthier and trying to stay active.  He no longer feels as though he needs to be on medication   ROS: See pertinent positives and negatives per HPI.  Past Medical History:  Diagnosis Date  . Arthritis    BACK  . Eczema of hand   . Hypertension   . Low back pain   . Smoker     Past Surgical History:  Procedure Laterality Date  . RESECTION DISTAL CLAVICAL Left 08/13/2015   Procedure: RESECTION DISTAL CLAVICAL;  Surgeon: Ninetta Lights, MD;  Location: [place];  Service: Orthopedics;  Laterality: Left;  . SHOULDER ARTHROSCOPY WITH ROTATOR CUFF REPAIR AND SUBACROMIAL DECOMPRESSION Left 08/13/2015   Procedure: LEFT SHOULDER ARTHROSCOPY DEBRIDEMENT ACROMIOPLASTY, DISTAL CLAVICAL EXCISION ROTATOR CUFF REPAIR ;  Surgeon: Ninetta Lights, MD;  Location: [place];  Service: Orthopedics;  Laterality: Left;   . TONSILLECTOMY    . UPPER GASTROINTESTINAL ENDOSCOPY      Family History  Problem Relation Age of Onset  . Hypertension Mother   . Glaucoma Father   . Diabetes Maternal Grandmother   . Stroke Maternal Grandmother   . Colon cancer Paternal Grandfather   . Other Neg Hx        no family history of colon cancer  . Stomach cancer Neg Hx   . Esophageal cancer Neg Hx   . Prostate cancer Neg Hx      Current Outpatient Medications:  .  amLODipine (NORVASC) 5 MG tablet, TAKE 1 TABLET BY MOUTH EVERY DAY, Disp: 90 tablet, Rfl: 0 .  aspirin EC 81 MG tablet, Take 81 mg by mouth daily., Disp: , Rfl:  .  labetalol (NORMODYNE) 300 MG tablet, TAKE 1 TABLET BY MOUTH TWICE A DAY, Disp: 180 tablet, Rfl: 0 .  Multiple Vitamins-Minerals (CENTRUM ADULTS PO), Take by mouth., Disp: , Rfl:  .  omeprazole (PRILOSEC) 20 MG capsule, Take 1 capsule (20 mg total) by mouth daily., Disp: 90 capsule, Rfl: 1 .  simvastatin (ZOCOR) 10 MG tablet, Take 1 tablet (10 mg total) by mouth daily., Disp: 90 tablet, Rfl: 0  EXAM:  VITALS per patient if applicable:  GENERAL: alert, oriented, appears well and in no acute distress  HEENT: atraumatic, conjunttiva clear, no obvious abnormalities on inspection of external nose and ears  NECK: normal movements of the head and neck  LUNGS: on inspection no signs  of respiratory distress, breathing rate appears normal, no obvious gross SOB, gasping or wheezing  CV: no obvious cyanosis  MS: moves all visible extremities without noticeable abnormality  PSYCH/NEURO: pleasant and cooperative, no obvious depression or anxiety, speech and thought processing grossly intact  ASSESSMENT AND PLAN:  Discussed the following assessment and plan:  Encouraged to continue to work on quitting smoking I would like him to have quit completely by his physical within 90 days.  Per patient there is no need for depression medication at this time.  He was advised to follow-up for his CPE or  sooner if needed  Tobacco use disorder  Gastroesophageal reflux disease with esophagitis - Plan: omeprazole (PRILOSEC) 20 MG capsule  Depression, major, single episode, complete remission ([place])     I discussed the assessment and treatment plan with the patient. The patient was provided an opportunity to ask questions and all were answered. The patient agreed with the plan and demonstrated an understanding of the instructions.   The patient was advised to call back or seek an in-person evaluation if the symptoms worsen or if the condition fails to improve as anticipated.   Dorothyann Peng, NP

## 2018-08-30 ENCOUNTER — Telehealth: Payer: Self-pay | Admitting: Adult Health

## 2018-08-30 NOTE — Telephone Encounter (Signed)
LMVM for the patient to call back and make an appointment for his phyiscal with Dorothyann Peng

## 2018-09-03 ENCOUNTER — Other Ambulatory Visit: Payer: Self-pay | Admitting: Adult Health

## 2018-09-03 DIAGNOSIS — I10 Essential (primary) hypertension: Secondary | ICD-10-CM

## 2018-09-03 DIAGNOSIS — F17200 Nicotine dependence, unspecified, uncomplicated: Secondary | ICD-10-CM

## 2018-09-03 DIAGNOSIS — F321 Major depressive disorder, single episode, moderate: Secondary | ICD-10-CM

## 2018-09-05 NOTE — Telephone Encounter (Signed)
Wellbutrin Denied.  Pt is no longer taking.  All others filled for 90 days.  Pt has upcoming cpx in Oct 2020.

## 2018-09-16 ENCOUNTER — Other Ambulatory Visit: Payer: Self-pay | Admitting: Adult Health

## 2018-09-16 DIAGNOSIS — E782 Mixed hyperlipidemia: Secondary | ICD-10-CM

## 2018-09-18 NOTE — Telephone Encounter (Signed)
Sent to the pharmacy by e-scribe. 

## 2018-10-06 ENCOUNTER — Other Ambulatory Visit: Payer: Self-pay | Admitting: Adult Health

## 2018-10-06 DIAGNOSIS — F321 Major depressive disorder, single episode, moderate: Secondary | ICD-10-CM

## 2018-10-06 DIAGNOSIS — F17200 Nicotine dependence, unspecified, uncomplicated: Secondary | ICD-10-CM

## 2018-10-10 NOTE — Telephone Encounter (Signed)
DENIED.  MEDICATION WAS  D/C.  PATIENT IS NO LONGER TAKING.

## 2018-10-25 ENCOUNTER — Other Ambulatory Visit: Payer: Self-pay

## 2018-10-25 DIAGNOSIS — Z20822 Contact with and (suspected) exposure to covid-19: Secondary | ICD-10-CM

## 2018-10-25 DIAGNOSIS — R6889 Other general symptoms and signs: Secondary | ICD-10-CM | POA: Diagnosis not present

## 2018-10-26 LAB — NOVEL CORONAVIRUS, NAA: SARS-CoV-2, NAA: NOT DETECTED

## 2018-11-15 DIAGNOSIS — H40013 Open angle with borderline findings, low risk, bilateral: Secondary | ICD-10-CM | POA: Diagnosis not present

## 2018-12-05 ENCOUNTER — Encounter: Payer: BC Managed Care – PPO | Admitting: Adult Health

## 2018-12-05 ENCOUNTER — Encounter: Payer: Self-pay | Admitting: Adult Health

## 2018-12-05 NOTE — Progress Notes (Signed)
Subjective:    Patient ID: Scott Carson, male    DOB: 06/12/1970, 48 y.o.   MRN: [id number]  HPI  Patient presents for yearly preventative medicine examination. He is a pleasant 48 year old male who  has a past medical history of Arthritis, CVA (cerebral vascular accident) ([place]), Eczema of hand, Hypertension, Low back pain, and Smoker.  Hypertension -takes Norvasc 5 mg daily and labetalol 300 mg twice daily.  He denies chest pain, shortness of breath, dizziness, lightheadedness, blurred vision, or syncopal episodes. 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  He underwent TTE with bubble study in December 2018 demonstrated EF of 55 to 60% with mildly dilated a sending aorta and negative bubble study.  CTA of the head and neck in December 2018 demonstrated no significant large vessel stenosis or occlusion.  24-hour Holter monitor in January 2019 did not show any arrhythmias  Tobacco Use   All immunizations and health maintenance protocols were reviewed with the patient and needed orders were placed. Due for flu vaccination   Appropriate screening laboratory values were ordered for the patient including screening of hyperlipidemia, renal function and hepatic function. If indicated by BPH, a PSA was ordered.  Medication reconciliation,  past medical history, social history, problem list  and allergies were reviewed in detail with the patient  Goals were established with regard to weight loss, exercise, and  diet in compliance with medications   Review of Systems  Constitutional: Negative.   HENT: Negative.   Eyes: Negative.   Respiratory: Negative.   Cardiovascular: Negative.   Gastrointestinal: Negative.   Endocrine: Negative.   Genitourinary: Negative.   Musculoskeletal: Negative.   Skin: Negative.   Allergic/Immunologic: Negative.   Neurological: Negative.   Hematological: Negative.   Psychiatric/Behavioral: Negative.   All other systems reviewed and are negative.  Past Medical History:  Diagnosis Date  . Arthritis    BACK  . CVA (cerebral vascular accident) ([place])   . Eczema of hand   . Hypertension   . Low back pain   . Smoker     Social History   Socioeconomic History  . Marital status: Married    Spouse name: Kennyth Lose  . Number of children: 3  . Years of education: Not on file  . Highest education level: 12th grade  Occupational History  . Occupation: Designer, multimedia    Comment: Dance movement psychotherapist  Social Needs  . Financial resource strain: Not on file  . Food insecurity    Worry: Not on file    Inability: Not on file  . Transportation needs    Medical: Not on file    Non-medical: Not on file  Tobacco Use  . Smoking status: Current Every Day Smoker    Packs/day: 0.50    Types: Cigars    Start date: 04/30/2012  Last attempt to quit: 07/01/2013    Years since quitting: 5.4  . Smokeless tobacco: Never Used  Substance and Sexual Activity  . Alcohol use: No    Alcohol/week: 0.0 standard drinks    Frequency: Never    Comment: occasionaly  . Drug use: Yes    Types: Marijuana    Comment: Quit smoking & marijuana 1 month ago  . Sexual activity: Not on file  Lifestyle  . Physical activity    Days per week: Not on file    Minutes per session: Not on file  . Stress: Not on file  Relationships  . Social Herbalist on phone:  Not on file    Gets together: Not on file    Attends religious service: Not on file    Active member of club or organization: Not on file    Attends meetings of clubs or organizations: Not on file    Relationship status: Not on file  . Intimate partner violence    Fear of current or ex partner: Not on file    Emotionally abused: Not on file    Physically abused: Not on file    Forced sexual activity: Not on file  Other Topics Concern  . Not on file  Social History Narrative   Married   Alcohol use-no      Current Smoker   Occupation: Architect         Pt quit smoking cigarettes and cigars with Chantix. Does use cannibus. Drinks 2-3 cuos of coffee and 2-3 glasses of tea a day. No regular exercise, though is active at work. He is an only child.         Past Surgical History:  Procedure Laterality Date  . RESECTION DISTAL CLAVICAL Left 08/13/2015   Procedure: RESECTION DISTAL CLAVICAL;  Surgeon: Ninetta Lights, MD;  Location: [place];  Service: Orthopedics;  Laterality: Left;  . SHOULDER ARTHROSCOPY WITH ROTATOR CUFF REPAIR AND SUBACROMIAL DECOMPRESSION Left 08/13/2015   Procedure: LEFT SHOULDER ARTHROSCOPY DEBRIDEMENT ACROMIOPLASTY, DISTAL CLAVICAL EXCISION ROTATOR CUFF REPAIR ;  Surgeon: Ninetta Lights, MD;  Location: [place];  Service: Orthopedics;  Laterality: Left;  . TONSILLECTOMY    . UPPER GASTROINTESTINAL ENDOSCOPY      Family History  Problem Relation Age of Onset  . Hypertension Mother   . Glaucoma Father   . Diabetes Maternal Grandmother   . Stroke Maternal Grandmother   . Colon cancer Paternal Grandfather   . Other Neg Hx        no family history of colon cancer  . Stomach cancer Neg Hx   . Esophageal cancer Neg Hx   . Prostate cancer Neg Hx     No Known Allergies  Current Outpatient Medications on File Prior to Visit  Medication Sig Dispense Refill  . amLODipine (NORVASC) 5 MG tablet TAKE 1 TABLET BY MOUTH EVERY DAY  90 tablet 0  . aspirin EC 81 MG tablet Take 81 mg by mouth daily.    Marland Kitchen labetalol (NORMODYNE) 300 MG tablet TAKE 1 TABLET BY MOUTH TWICE A DAY 180 tablet 0  . Multiple Vitamins-Minerals (CENTRUM ADULTS PO) Take by mouth.    Marland Kitchen omeprazole (PRILOSEC) 20 MG capsule Take 1 capsule (20 mg total) by mouth daily. 90 capsule 1  . simvastatin (ZOCOR) 10 MG tablet TAKE 1 TABLET BY MOUTH EVERY DAY 90 tablet 0   No current facility-administered medications on file prior to visit.  There were no vitals taken for this visit.      Objective:   Physical Exam Vitals signs and nursing note reviewed.  Constitutional:      General: He is not in acute distress.    Appearance: Normal appearance. He is well-developed. He is not diaphoretic.  HENT:     Head: Normocephalic and atraumatic.     Right Ear: Tympanic membrane, ear canal and external ear normal. There is no impacted cerumen.     Left Ear: Tympanic membrane, ear canal and external ear normal. There is no impacted cerumen.     Nose: Nose normal.     Mouth/Throat:     Mouth: Mucous membranes are moist.     Pharynx: Oropharynx is clear. No oropharyngeal exudate or posterior oropharyngeal erythema.  Eyes:     General: No scleral icterus.       Right eye: No discharge.        Left eye: No discharge.     Extraocular Movements: Extraocular movements intact.     Conjunctiva/sclera: Conjunctivae normal.     Pupils: Pupils are equal, round, and reactive to light.  Neck:     Thyroid: No thyromegaly.     Trachea: No tracheal deviation.  Cardiovascular:     Rate and Rhythm: Normal rate and regular rhythm.     Pulses: Normal pulses.     Heart sounds: Normal heart sounds. No murmur. No friction rub. No gallop.   Pulmonary:     Effort: Pulmonary effort is normal. No respiratory distress.     Breath sounds: Normal breath sounds. No stridor. No wheezing, rhonchi or rales.  Chest:     Chest wall: No tenderness.  Abdominal:     General: Bowel sounds  are normal. There is no distension.     Palpations: Abdomen is soft. There is no mass.     Tenderness: There is no abdominal tenderness. There is no right CVA tenderness, left CVA tenderness, guarding or rebound.     Hernia: No hernia is present.  Musculoskeletal: Normal range of motion.        General: No swelling, tenderness, deformity or signs of injury.     Right lower leg: No edema.     Left lower leg: No edema.  Lymphadenopathy:     Cervical: No cervical adenopathy.  Skin:    General: Skin is warm and dry.     Capillary Refill: Capillary refill takes less than 2 seconds.     Coloration: Skin is not jaundiced or pale.     Findings: No bruising, erythema, lesion or rash.  Neurological:     General: No focal deficit present.     Mental Status: He is alert and oriented to person, place, and time. Mental status is at baseline.     Cranial Nerves: No cranial nerve deficit.     Sensory: No sensory deficit.     Motor: No weakness.     Coordination: Coordination normal.     Gait: Gait normal.     Deep Tendon Reflexes: Reflexes normal.  Psychiatric:        Mood and Affect: Mood normal.        Behavior: Behavior normal.        Thought Content: Thought content normal.        Judgment: Judgment normal.       Assessment & Plan:

## 2018-12-10 ENCOUNTER — Other Ambulatory Visit: Payer: Self-pay | Admitting: Adult Health

## 2018-12-10 DIAGNOSIS — I10 Essential (primary) hypertension: Secondary | ICD-10-CM

## 2018-12-20 ENCOUNTER — Other Ambulatory Visit: Payer: Self-pay | Admitting: Adult Health

## 2018-12-21 NOTE — Telephone Encounter (Signed)
Sent to the pharmacy by e-scribe.  Pt has upcoming cpx. 

## 2019-01-09 ENCOUNTER — Other Ambulatory Visit: Payer: Self-pay

## 2019-01-10 ENCOUNTER — Ambulatory Visit (INDEPENDENT_AMBULATORY_CARE_PROVIDER_SITE_OTHER): Payer: BC Managed Care – PPO | Admitting: Adult Health

## 2019-01-10 ENCOUNTER — Encounter: Payer: Self-pay | Admitting: Adult Health

## 2019-01-10 VITALS — BP 130/88 | Temp 98.2°F | Ht 72.0 in | Wt 214.0 lb

## 2019-01-10 DIAGNOSIS — E782 Mixed hyperlipidemia: Secondary | ICD-10-CM | POA: Diagnosis not present

## 2019-01-10 DIAGNOSIS — Z23 Encounter for immunization: Secondary | ICD-10-CM

## 2019-01-10 DIAGNOSIS — Z0000 Encounter for general adult medical examination without abnormal findings: Secondary | ICD-10-CM

## 2019-01-10 DIAGNOSIS — Z1211 Encounter for screening for malignant neoplasm of colon: Secondary | ICD-10-CM

## 2019-01-10 DIAGNOSIS — F17200 Nicotine dependence, unspecified, uncomplicated: Secondary | ICD-10-CM | POA: Diagnosis not present

## 2019-01-10 DIAGNOSIS — R351 Nocturia: Secondary | ICD-10-CM

## 2019-01-10 DIAGNOSIS — N401 Enlarged prostate with lower urinary tract symptoms: Secondary | ICD-10-CM

## 2019-01-10 DIAGNOSIS — I10 Essential (primary) hypertension: Secondary | ICD-10-CM

## 2019-01-10 MED ORDER — CHANTIX STARTING MONTH PAK 0.5 MG X 11 & 1 MG X 42 PO TABS: ORAL_TABLET | ORAL | 0 refills | Status: DC

## 2019-01-10 MED ORDER — VARENICLINE TARTRATE 1 MG PO TABS: 1.0000 mg | ORAL_TABLET | Freq: Two times a day (BID) | ORAL | 2 refills | Status: DC

## 2019-01-10 NOTE — Addendum Note (Signed)
Addended by: Miles Costain T on: 01/10/2019 03:33 PM   Modules accepted: Orders

## 2019-01-10 NOTE — Patient Instructions (Signed)
It was great seeing you today   I have prescribed Chantix - you have the starting pack and continuation pack at the pharmacy   Someone will call you to schedule you for your colonoscopy   Please make a lab appointment at the front desk

## 2019-01-10 NOTE — Progress Notes (Signed)
Subjective:    Patient ID: Scott Carson, male    DOB: [birth year], 48 y.o.   MRN: [id number]  HPI Patient presents for yearly preventative medicine examination. He is a pleasant 48 year old male who  has a past medical history of Arthritis, CVA (cerebral vascular accident) ([place]), Eczema of hand, Hypertension, Low back pain, and Smoker.  HTN - Currently prescribed Norvasc and Labetalol. He does not monitor his BP at home routinely. He denies chest pain, shortness of breath, dizziness, lightheadedness, or syncope   BP Readings from Last 3 Encounters:  01/10/19 130/88  02/08/18 (!) 118/92  07/31/17 128/90   Tobacco Use - smokes about a pack a day. He has quit using Chantix in the past and would like to try this medication again.   GERD - well controlled on Prilosec   Hyperlipidemia - takes simvastatin daily  Lab Results  Component Value Date   CHOL 116 07/31/2017   HDL 48 07/31/2017   LDLCALC 52 07/31/2017   TRIG 77 07/31/2017   CHOLHDL 2.4 07/31/2017   BPH - he is complaining of decreased stream, nocturia, weak stream, urgency, and incomplete bladder emptying. He denies hematuria or dysuria. These symptoms have been present for a few months.   All immunizations and health maintenance protocols were reviewed with the patient and needed orders were placed. Due for flu vaccination   Appropriate screening laboratory values were ordered for the patient including screening of hyperlipidemia, renal function and hepatic function. If indicated by BPH, a PSA was ordered.  Medication reconciliation,  past medical history, social history, problem list and allergies were reviewed in detail with the patient  Goals were established with regard to weight loss, exercise, and  diet in compliance with medications  End of life planning was discussed.   Review of Systems  Constitutional: Negative.   HENT: Negative.   Eyes: Negative.   Respiratory: Negative.   Cardiovascular: Negative.    Gastrointestinal: Negative.   Endocrine: Negative.   Genitourinary: Positive for decreased urine volume, difficulty urinating, frequency and urgency.  Musculoskeletal: Negative.   Skin: Negative.   Allergic/Immunologic: Negative.   Neurological: Negative.   Hematological: Negative.   Psychiatric/Behavioral: Negative.   All other systems reviewed and are negative.  Past Medical History:  Diagnosis Date  . Arthritis    BACK  . CVA (cerebral vascular accident) ([place])   . Eczema of hand   . Hypertension   . Low back pain   . Smoker     Social History   Socioeconomic History  . Marital status: Married    Spouse name: Kennyth Lose  . Number of children: 3  . Years of education: Not on file  . Highest education level: 12th grade  Occupational History  . Occupation: Designer, multimedia    Comment: Dance movement psychotherapist  Social Needs  . Financial resource strain: Not on file  . Food insecurity    Worry: Not on file    Inability: Not on file  . Transportation needs    Medical: Not on file    Non-medical: Not on file  Tobacco Use  . Smoking status: Current Every Day Smoker    Packs/day: 0.50    Types: Cigars    Start date: 04/30/2012    Last attempt to quit: 07/01/2013    Years since quitting: 5.5  . Smokeless tobacco: Never Used  Substance and Sexual Activity  . Alcohol use: No    Alcohol/week: 0.0 standard drinks    Frequency: Never  Comment: occasionaly  . Drug use: Yes    Types: Marijuana    Comment: Quit smoking & marijuana 1 month ago  . Sexual activity: Not on file  Lifestyle  . Physical activity    Days per week: Not on file    Minutes per session: Not on file  . Stress: Not on file  Relationships  . Social Herbalist on phone: Not on file    Gets together: Not on file    Attends religious service: Not on file    Active member of club or organization: Not on file    Attends meetings of clubs or organizations: Not on file    Relationship status: Not on  file  . Intimate partner violence    Fear of current or ex partner: Not on file    Emotionally abused: Not on file    Physically abused: Not on file    Forced sexual activity: Not on file  Other Topics Concern  . Not on file  Social History Narrative   Married   Alcohol use-no      Current Smoker   Occupation: Architect         Pt quit smoking cigarettes and cigars with Chantix. Does use cannibus. Drinks 2-3 cuos of coffee and 2-3 glasses of tea a day. No regular exercise, though is active at work. He is an only child.         Past Surgical History:  Procedure Laterality Date  . RESECTION DISTAL CLAVICAL Left 08/13/2015   Procedure: RESECTION DISTAL CLAVICAL;  Surgeon: Ninetta Lights, MD;  Location: [facility];  Service: Orthopedics;  Laterality: Left;  . SHOULDER ARTHROSCOPY WITH ROTATOR CUFF REPAIR AND SUBACROMIAL DECOMPRESSION Left 08/13/2015   Procedure: LEFT SHOULDER ARTHROSCOPY DEBRIDEMENT ACROMIOPLASTY, DISTAL CLAVICAL EXCISION ROTATOR CUFF REPAIR ;  Surgeon: Ninetta Lights, MD;  Location: [facility];  Service: Orthopedics;  Laterality: Left;  . TONSILLECTOMY    . UPPER GASTROINTESTINAL ENDOSCOPY      Family History  Problem Relation Age of Onset  . Hypertension Mother   . Glaucoma Father   . Diabetes Maternal Grandmother   . Stroke Maternal Grandmother   . Colon cancer Paternal Grandfather   . Other Neg Hx        no family history of colon cancer  . Stomach cancer Neg Hx   . Esophageal cancer Neg Hx   . Prostate cancer Neg Hx     No Known Allergies  Current Outpatient Medications on File Prior to Visit  Medication Sig Dispense Refill  . amLODipine (NORVASC) 5 MG tablet TAKE 1 TABLET BY MOUTH EVERY DAY 90 tablet 0  . aspirin EC 81 MG tablet Take 81 mg by mouth daily.    Marland Kitchen labetalol (NORMODYNE) 300 MG tablet TAKE 1 TABLET BY MOUTH TWICE A DAY 180 tablet 0  . Multiple Vitamins-Minerals (CENTRUM ADULTS PO) Take by mouth.    .  simvastatin (ZOCOR) 10 MG tablet TAKE 1 TABLET BY MOUTH EVERY DAY 90 tablet 0  . omeprazole (PRILOSEC) 20 MG capsule Take 1 capsule (20 mg total) by mouth daily. 90 capsule 1   No current facility-administered medications on file prior to visit.     BP 130/88   Temp 98.2 F (36.8 C) (Temporal)   Ht 6' (1.829 m)   Wt 214 lb (97.1 kg)   BMI 29.02 kg/m       Objective:   Physical Exam Vitals signs  and nursing note reviewed.  Constitutional:      General: He is not in acute distress.    Appearance: Normal appearance. He is well-developed. He is not diaphoretic.  HENT:     Head: Normocephalic and atraumatic.     Right Ear: Tympanic membrane and external ear normal.     Left Ear: Tympanic membrane and external ear normal.     Nose: Nose normal.     Mouth/Throat:     Mouth: Mucous membranes are moist.     Pharynx: Oropharynx is clear. No oropharyngeal exudate.  Eyes:     General:        Right eye: No discharge.        Left eye: No discharge.     Conjunctiva/sclera: Conjunctivae normal.     Pupils: Pupils are equal, round, and reactive to light.  Neck:     Thyroid: No thyromegaly.     Vascular: No carotid bruit.     Trachea: No tracheal deviation.  Cardiovascular:     Rate and Rhythm: Normal rate and regular rhythm.     Pulses: Normal pulses.     Heart sounds: Normal heart sounds. No murmur. No friction rub. No gallop.   Pulmonary:     Effort: Pulmonary effort is normal. No respiratory distress.     Breath sounds: Normal breath sounds. No stridor. No wheezing, rhonchi or rales.  Chest:     Chest wall: No tenderness.  Abdominal:     General: Bowel sounds are normal. There is no distension.     Palpations: Abdomen is soft.     Tenderness: There is no abdominal tenderness. There is no guarding or rebound.  Musculoskeletal: Normal range of motion.        General: No swelling, tenderness, deformity or signs of injury.     Right lower leg: No edema.     Left lower leg: No  edema.  Lymphadenopathy:     Cervical: No cervical adenopathy.  Skin:    General: Skin is warm and dry.     Capillary Refill: Capillary refill takes less than 2 seconds.     Coloration: Skin is not jaundiced or pale.     Findings: No bruising, erythema, lesion or rash.  Neurological:     General: No focal deficit present.     Mental Status: He is alert and oriented to person, place, and time.     Cranial Nerves: No cranial nerve deficit.     Coordination: Coordination normal.  Psychiatric:        Thought Content: Thought content normal.        Judgment: Judgment normal.       Assessment & Plan:  1. Routine general medical examination at a health care facility - Needs to quit smoking and start exercising and eating healthy  - Follow up in one year or sooner if needed - CBC with Differential/Platelet; Future - CMP; Future - Hemoglobin A1c; Future - Lipid panel; Future - TSH; Future  2. Colon cancer screening  - Screening Colonoscopy  3. Tobacco use disorder  - varenicline (CHANTIX STARTING MONTH PAK) 0.5 MG X 11 & 1 MG X 42 tablet; Take one 0.5 mg tablet by mouth once daily for 3 days, then increase to one 0.5 mg tablet twice daily for 4 days, then increase to one 1 mg tablet twice daily.  Dispense: 53 tablet; Refill: 0 - varenicline (CHANTIX CONTINUING MONTH PAK) 1 MG tablet; Take 1 tablet (1 mg total) by  mouth 2 (two) times daily.  Dispense: 60 tablet; Refill: 2  4. Mixed hyperlipidemia - Consider increase in statin  - CBC with Differential/Platelet; Future - CMP; Future - Hemoglobin A1c; Future - Lipid panel; Future - TSH; Future  5. Benign prostatic hyperplasia with nocturia - We discussed medications and side effects. He would like to hold off on medications for the time being  - PSA; Future  6. Essential hypertension - no change in medications  - CBC with Differential/Platelet; Future - CMP; Future - Hemoglobin A1c; Future - Lipid panel; Future - TSH;  Future  Dorothyann Peng, NP

## 2019-01-11 ENCOUNTER — Other Ambulatory Visit: Payer: Self-pay

## 2019-01-11 ENCOUNTER — Other Ambulatory Visit: Payer: BC Managed Care – PPO

## 2019-01-11 ENCOUNTER — Other Ambulatory Visit (INDEPENDENT_AMBULATORY_CARE_PROVIDER_SITE_OTHER): Payer: BC Managed Care – PPO

## 2019-01-11 DIAGNOSIS — R351 Nocturia: Secondary | ICD-10-CM

## 2019-01-11 DIAGNOSIS — E782 Mixed hyperlipidemia: Secondary | ICD-10-CM

## 2019-01-11 DIAGNOSIS — Z0000 Encounter for general adult medical examination without abnormal findings: Secondary | ICD-10-CM

## 2019-01-11 DIAGNOSIS — Z125 Encounter for screening for malignant neoplasm of prostate: Secondary | ICD-10-CM

## 2019-01-11 DIAGNOSIS — I10 Essential (primary) hypertension: Secondary | ICD-10-CM

## 2019-01-11 DIAGNOSIS — E785 Hyperlipidemia, unspecified: Secondary | ICD-10-CM | POA: Diagnosis not present

## 2019-01-11 DIAGNOSIS — N401 Enlarged prostate with lower urinary tract symptoms: Secondary | ICD-10-CM

## 2019-01-11 LAB — LIPID PANEL
Cholesterol: 111 mg/dL (ref 0–200)
HDL: 46.4 mg/dL (ref >39.00)
LDL Cholesterol: 58 mg/dL (ref 0–99)
NonHDL: 64.84
Total CHOL/HDL Ratio: 2
Triglycerides: 34 mg/dL (ref 0.0–149.0)
VLDL: 6.8 mg/dL (ref 0.0–40.0)

## 2019-01-11 LAB — COMPREHENSIVE METABOLIC PANEL
ALT: 19 U/L (ref 0–53)
AST: 25 U/L (ref 0–37)
Albumin: 4.4 g/dL (ref 3.5–5.2)
Alkaline Phosphatase: 70 U/L (ref 39–117)
BUN: 12 mg/dL (ref 6–23)
CO2: 28 mEq/L (ref 19–32)
Calcium: 9.1 mg/dL (ref 8.4–10.5)
Chloride: 105 mEq/L (ref 96–112)
Creatinine, Ser: 1.01 mg/dL (ref 0.40–1.50)
GFR: 94.73 mL/min (ref >60.00)
Glucose, Bld: 86 mg/dL (ref 70–99)
Potassium: 4 mEq/L (ref 3.5–5.1)
Sodium: 140 mEq/L (ref 135–145)
Total Bilirubin: 1.8 mg/dL — ABNORMAL HIGH (ref 0.2–1.2)
Total Protein: 6.9 g/dL (ref 6.0–8.3)

## 2019-01-11 LAB — CBC WITH DIFFERENTIAL/PLATELET
Basophils Absolute: 0 10*3/uL (ref 0.0–0.1)
Basophils Relative: 0.1 % (ref 0.0–3.0)
Eosinophils Absolute: 0 10*3/uL (ref 0.0–0.7)
Eosinophils Relative: 0.2 % (ref 0.0–5.0)
HCT: 44.7 % (ref 39.0–52.0)
Hemoglobin: 14.9 g/dL (ref 13.0–17.0)
Lymphocytes Relative: 8.8 % — ABNORMAL LOW (ref 12.0–46.0)
Lymphs Abs: 0.5 10*3/uL — ABNORMAL LOW (ref 0.7–4.0)
MCHC: 33.4 g/dL (ref 30.0–36.0)
MCV: 90.8 fl (ref 78.0–100.0)
Monocytes Absolute: 0.6 10*3/uL (ref 0.1–1.0)
Monocytes Relative: 10.6 % (ref 3.0–12.0)
Neutro Abs: 4.2 10*3/uL (ref 1.4–7.7)
Neutrophils Relative %: 80.3 % — ABNORMAL HIGH (ref 43.0–77.0)
Platelets: 214 10*3/uL (ref 150.0–400.0)
RBC: 4.92 Mil/uL (ref 4.22–5.81)
RDW: 13.9 % (ref 11.5–15.5)
WBC: 5.3 10*3/uL (ref 4.0–10.5)

## 2019-01-11 LAB — HEMOGLOBIN A1C: Hgb A1c MFr Bld: 5.6 % (ref 4.6–6.5)

## 2019-01-11 LAB — PSA: PSA: 1.76 ng/mL (ref 0.10–4.00)

## 2019-01-11 LAB — TSH: TSH: 0.65 u[IU]/mL (ref 0.35–4.50)

## 2019-01-11 NOTE — Addendum Note (Signed)
Addended by: Trenda Moots on: [id number] 08:11 AM   Modules accepted: Orders

## 2019-02-24 ENCOUNTER — Other Ambulatory Visit: Payer: Self-pay | Admitting: Adult Health

## 2019-02-24 DIAGNOSIS — E782 Mixed hyperlipidemia: Secondary | ICD-10-CM

## 2019-03-15 ENCOUNTER — Other Ambulatory Visit: Payer: Self-pay | Admitting: Adult Health

## 2019-03-15 DIAGNOSIS — K2100 Gastro-esophageal reflux disease with esophagitis, without bleeding: Secondary | ICD-10-CM

## 2019-03-17 ENCOUNTER — Other Ambulatory Visit: Payer: Self-pay | Admitting: Adult Health

## 2019-03-17 DIAGNOSIS — I10 Essential (primary) hypertension: Secondary | ICD-10-CM

## 2019-03-19 DIAGNOSIS — H40013 Open angle with borderline findings, low risk, bilateral: Secondary | ICD-10-CM | POA: Diagnosis not present

## 2019-04-08 DIAGNOSIS — H40013 Open angle with borderline findings, low risk, bilateral: Secondary | ICD-10-CM | POA: Diagnosis not present

## 2019-05-06 ENCOUNTER — Encounter: Payer: Self-pay | Admitting: Gastroenterology

## 2019-05-24 ENCOUNTER — Ambulatory Visit: Payer: Self-pay | Attending: Internal Medicine

## 2019-05-24 DIAGNOSIS — Z23 Encounter for immunization: Secondary | ICD-10-CM

## 2019-05-24 NOTE — Progress Notes (Signed)
   Covid-19 Vaccination Clinic  Name:  Scott Carson    MRN: [id number] DOB: 1970-09-20  05/24/2019  [name] was observed post Covid-19 immunization for 15 minutes without incident. He was provided with Vaccine Information Sheet and instruction to access the V-Safe system.   [name] was instructed to call 911 with any severe reactions post vaccine: Marland Kitchen Difficulty breathing  . Swelling of face and throat  . A fast heartbeat  . A bad rash all over body  . Dizziness and weakness   Immunizations Administered    Name Date Dose VIS Date Route   Pfizer COVID-19 Vaccine 05/24/2019 10:37 AM 0.3 mL 02/01/2019 Intramuscular   Manufacturer: [employer], [employer]   Lot: [id number]   [place]: [id number]

## 2019-06-07 ENCOUNTER — Other Ambulatory Visit: Payer: Self-pay

## 2019-06-07 ENCOUNTER — Ambulatory Visit (AMBULATORY_SURGERY_CENTER): Payer: Self-pay | Admitting: *Deleted

## 2019-06-07 VITALS — Temp 97.1°F | Ht 72.0 in | Wt 216.0 lb

## 2019-06-07 DIAGNOSIS — Z1211 Encounter for screening for malignant neoplasm of colon: Secondary | ICD-10-CM

## 2019-06-07 DIAGNOSIS — Z01818 Encounter for other preprocedural examination: Secondary | ICD-10-CM

## 2019-06-07 MED ORDER — SUPREP BOWEL PREP KIT 17.5-3.13-1.6 GM/177ML PO SOLN: 1.0000 | Freq: Once | ORAL | 0 refills | Status: AC

## 2019-06-07 NOTE — Progress Notes (Signed)
No egg or soy allergy known to patient  No issues with past sedation with any surgeries  or procedures, no intubation problems  No diet pills per patient No home 02 use per patient  No blood thinners per patient  Pt denies issues with constipation  No A fib or A flutter  EMMI video sent to pt's e mail   Due to the COVID-19 pandemic we are asking patients to follow these guidelines. Please only bring one care partner. Please be aware that your care partner may wait in the car in the parking lot or if they feel like they will be too hot to wait in the car, they may wait in the lobby on the 4th floor. All care partners are required to wear a mask the entire time (we do not have any that we can provide them), they need to practice social distancing, and we will do a Covid check for all patient's and care partners when you arrive. Also we will check their temperature and your temperature. If the care partner waits in their car they need to stay in the parking lot the entire time and we will call them on their cell phone when the patient is ready for discharge so they can bring the car to the front of the building. Also all patient's will need to wear a mask into building. Suprep code and coupon

## 2019-06-08 ENCOUNTER — Other Ambulatory Visit: Payer: Self-pay | Admitting: Adult Health

## 2019-06-08 DIAGNOSIS — E782 Mixed hyperlipidemia: Secondary | ICD-10-CM

## 2019-06-14 ENCOUNTER — Other Ambulatory Visit: Payer: Self-pay | Admitting: Adult Health

## 2019-06-14 NOTE — Telephone Encounter (Signed)
SENT TO THE PHARMACY BY E-SCRIBE. 

## 2019-06-17 ENCOUNTER — Ambulatory Visit: Payer: BC Managed Care – PPO | Attending: Internal Medicine

## 2019-06-17 DIAGNOSIS — Z23 Encounter for immunization: Secondary | ICD-10-CM

## 2019-06-17 NOTE — Progress Notes (Signed)
   Covid-19 Vaccination Clinic  Name:  Scott Carson    MRN: [id number] DOB: Feb 21, 1971  06/17/2019  Mr. Scott Carson was observed post Covid-19 immunization for 15 minutes without incident. He was provided with Vaccine Information Sheet and instruction to access the V-Safe system.   Mr. Scott Carson was instructed to call 911 with any severe reactions post vaccine: Marland Kitchen Difficulty breathing  . Swelling of face and throat  . A fast heartbeat  . A bad rash all over body  . Dizziness and weakness   Immunizations Administered    Name Date Dose VIS Date Route   Pfizer COVID-19 Vaccine 06/17/2019 12:36 PM 0.3 mL 04/17/2018 Intramuscular   Manufacturer: [place]   Lot: [id number]   [place]: [id number]

## 2019-06-19 ENCOUNTER — Other Ambulatory Visit: Payer: Self-pay

## 2019-06-19 ENCOUNTER — Other Ambulatory Visit: Payer: Self-pay | Admitting: Gastroenterology

## 2019-06-19 ENCOUNTER — Ambulatory Visit (INDEPENDENT_AMBULATORY_CARE_PROVIDER_SITE_OTHER): Payer: BC Managed Care – PPO

## 2019-06-19 DIAGNOSIS — Z1159 Encounter for screening for other viral diseases: Secondary | ICD-10-CM

## 2019-06-19 LAB — SARS CORONAVIRUS 2 (TAT 6-24 HRS): SARS Coronavirus 2: NEGATIVE

## 2019-06-21 ENCOUNTER — Ambulatory Visit (AMBULATORY_SURGERY_CENTER): Payer: BC Managed Care – PPO | Admitting: Gastroenterology

## 2019-06-21 ENCOUNTER — Other Ambulatory Visit: Payer: Self-pay

## 2019-06-21 ENCOUNTER — Encounter: Payer: Self-pay | Admitting: Gastroenterology

## 2019-06-21 VITALS — BP 142/94 | HR 69 | Temp 96.6°F | Resp 13 | Ht 72.0 in | Wt 216.0 lb

## 2019-06-21 DIAGNOSIS — Z1211 Encounter for screening for malignant neoplasm of colon: Secondary | ICD-10-CM

## 2019-06-21 MED ORDER — SODIUM CHLORIDE 0.9 % IV SOLN: 500.0000 mL | Freq: Once | INTRAVENOUS | Status: DC

## 2019-06-21 NOTE — Patient Instructions (Signed)
YOU HAD AN ENDOSCOPIC PROCEDURE TODAY AT THE [facility]:   Refer to the procedure report that was given to you for any specific questions about what was found during the examination.  If the procedure report does not answer your questions, please call your gastroenterologist to clarify.  If you requested that your care partner not be given the details of your procedure findings, then the procedure report has been included in a sealed envelope for you to review at your convenience later.  YOU SHOULD EXPECT: Some feelings of bloating in the abdomen. Passage of more gas than usual.  Walking can help get rid of the air that was put into your GI tract during the procedure and reduce the bloating. If you had a lower endoscopy (such as a colonoscopy or flexible sigmoidoscopy) you may notice spotting of blood in your stool or on the toilet paper. If you underwent a bowel prep for your procedure, you may not have a normal bowel movement for a few days.  Please Note:  You might notice some irritation and congestion in your nose or some drainage.  This is from the oxygen used during your procedure.  There is no need for concern and it should clear up in a day or so.  SYMPTOMS TO REPORT IMMEDIATELY:   Following lower endoscopy (colonoscopy or flexible sigmoidoscopy):  Excessive amounts of blood in the stool  Significant tenderness or worsening of abdominal pains  Swelling of the abdomen that is new, acute  Fever of 100F or higher   For urgent or emergent issues, a gastroenterologist can be reached at any hour by calling [phone]. Do not use MyChart messaging for urgent concerns.    DIET:  We do recommend a small meal at first, but then you may proceed to your regular diet.  Drink plenty of fluids but you should avoid alcoholic beverages for 24 hours.  MEDICATIONS: Continue present medications.  Please see handouts given to you by your recovery nurse.  ACTIVITY:  You should plan to  take it easy for the rest of today and you should NOT DRIVE or use heavy machinery until tomorrow (because of the sedation medicines used during the test).    FOLLOW UP: Our staff will call the number listed on your records 48-72 hours following your procedure to check on you and address any questions or concerns that you may have regarding the information given to you following your procedure. If we do not reach you, we will leave a message.  We will attempt to reach you two times.  During this call, we will ask if you have developed any symptoms of COVID 19. If you develop any symptoms (ie: fever, flu-like symptoms, shortness of breath, cough etc.) before then, please call [phone].  If you test positive for Covid 19 in the 2 weeks post procedure, please call and report this information to us.    If any biopsies were taken you will be contacted by phone or by letter within the next 1-3 weeks.  Please call us at [phone] if you have not heard about the biopsies in 3 weeks.   Thank you for allowing us to provide for your healthcare needs today.   SIGNATURES/CONFIDENTIALITY: You and/or your care partner have signed paperwork which will be entered into your electronic medical record.  These signatures attest to the fact that that the information above on your After Visit Summary has been reviewed and is understood.  Full responsibility of the   confidentiality of this discharge information lies with you and/or your care-partner. 

## 2019-06-21 NOTE — Progress Notes (Signed)
Patient's BP has been elevated today (see flowsheet). Patient denies symptoms. He states he did not take his BP meds this morning and that it has been "awhile" since he's followed up with his PCP regarding his BP. Instructed patient to follow up with his PCP to ensure his BP is well controlled. He states he will do so.

## 2019-06-21 NOTE — Op Note (Signed)
[place] Patient Name: Scott Carson Procedure Date: 06/21/2019 10:51 AM MRN: [id number] Endoscopist: Milus Banister , MD Age: 49 Referring MD:  Date of Birth: 1970/02/23 Gender: Male Account #: [id number] Procedure:                Colonoscopy Indications:              Screening for colorectal malignant neoplasm Medicines:                Monitored Anesthesia Care Procedure:                Pre-Anesthesia Assessment:                           - Prior to the procedure, a History and Physical                            was performed, and patient medications and                            allergies were reviewed. The patient's tolerance of                            previous anesthesia was also reviewed. The risks                            and benefits of the procedure and the sedation                            options and risks were discussed with the patient.                            All questions were answered, and informed consent                            was obtained. Prior Anticoagulants: The patient has                            taken no previous anticoagulant or antiplatelet                            agents. ASA Grade Assessment: III - A patient with                            severe systemic disease. After reviewing the risks                            and benefits, the patient was deemed in                            satisfactory condition to undergo the procedure.                           After obtaining informed consent, the colonoscope  was passed under direct vision. Throughout the                            procedure, the patient's blood pressure, pulse, and                            oxygen saturations were monitored continuously. The                            Colonoscope was introduced through the anus and                            advanced to the the cecum, identified by                            appendiceal orifice and  ileocecal valve. The                            colonoscopy was performed without difficulty. The                            patient tolerated the procedure well. The quality                            of the bowel preparation was good. The ileocecal                            valve, appendiceal orifice, and rectum were                            photographed. Scope In: 10:56:16 AM Scope Out: 11:08:16 AM Scope Withdrawal Time: 0 hours 9 minutes 9 seconds  Total Procedure Duration: 0 hours 12 minutes 0 seconds  Findings:                 Multiple small and large-mouthed diverticula were                            found in the entire colon.                           The exam was otherwise without abnormality on                            direct and retroflexion views. Complications:            No immediate complications. Estimated blood loss:                            None. Estimated Blood Loss:     Estimated blood loss: none. Impression:               - Diverticulosis in the entire examined colon.                           - The examination was otherwise normal on direct  and retroflexion views.                           - No polyps or cancers. Recommendation:           - Patient has a contact number available for                            emergencies. The signs and symptoms of potential                            delayed complications were discussed with the                            patient. Return to normal activities tomorrow.                            Written discharge instructions were provided to the                            patient.                           - Resume previous diet.                           - Continue present medications.                           - Repeat colonoscopy in 10 years for screening. Milus Banister, MD 06/21/2019 11:10:13 AM This report has been signed electronically.

## 2019-06-21 NOTE — Progress Notes (Signed)
Pt's states no medical or surgical changes since previsit or office visit.  Temp-JB  V/S-CW  Made Crna aware of b/p prior to procedure and that he did not take b/p medication. Pt. Denies any s/s from elevated b/p. and that pt. Smoked marjauana on yesterday

## 2019-06-21 NOTE — Progress Notes (Signed)
Report given to PACU, vss 

## 2019-06-25 ENCOUNTER — Telehealth: Payer: Self-pay

## 2019-06-25 NOTE — Telephone Encounter (Signed)
LVM

## 2019-06-27 ENCOUNTER — Other Ambulatory Visit: Payer: Self-pay

## 2019-06-28 ENCOUNTER — Encounter: Payer: Self-pay | Admitting: Adult Health

## 2019-06-28 ENCOUNTER — Ambulatory Visit (INDEPENDENT_AMBULATORY_CARE_PROVIDER_SITE_OTHER): Payer: BC Managed Care – PPO | Admitting: Adult Health

## 2019-06-28 VITALS — BP 140/100 | Temp 98.3°F | Wt 221.0 lb

## 2019-06-28 DIAGNOSIS — I10 Essential (primary) hypertension: Secondary | ICD-10-CM

## 2019-06-28 MED ORDER — HYDROCHLOROTHIAZIDE 12.5 MG PO CAPS: 12.5000 mg | ORAL_CAPSULE | Freq: Every day | ORAL | 1 refills | Status: DC

## 2019-06-28 NOTE — Progress Notes (Signed)
Subjective:    Patient ID: Scott Carson, male    DOB: 01-Dec-1970, 49 y.o.   MRN: [id number]  HPI 49 year old male who  has a past medical history of Arthritis, CVA (cerebral vascular accident) ([place]) (2020), Eczema of hand, Hypertension, Low back pain, and Smoker.  He presents to the office today for follow-up regarding hypertension.  Recently was having his colonoscopy done and his blood pressure was elevated.  Patient reported not taking his medication of the day of the colonoscopy and was advised to follow-up with his PCP to make sure his blood pressure was well controlled.  He is currently prescribed labetalol 300 mg twice daily and Norvasc 5 mg daily and is taking his medications on a daily basis.   His diet has been poor and he is not exercising.   He denies dizziness, lightheadedness, blurred vision, or headaches   BP Readings from Last 3 Encounters:  06/28/19 (!) 140/100  06/21/19 (!) 142/94  01/10/19 130/88    Wt Readings from Last 10 Encounters:  06/28/19 221 lb (100.2 kg)  06/21/19 216 lb (98 kg)  06/07/19 216 lb (98 kg)  01/10/19 214 lb (97.1 kg)  06/08/18 205 lb (93 kg)  02/08/18 209 lb (94.8 kg)  07/31/17 212 lb (96.2 kg)  07/13/17 216 lb (98 kg)  06/01/17 211 lb (95.7 kg)  02/08/17 213 lb 3.2 oz (96.7 kg)   Review of Systems See HPI   Past Medical History:  Diagnosis Date  . Arthritis    BACK  . CVA (cerebral vascular accident) ([place]) 2020  . Eczema of hand   . Hypertension   . Low back pain   . Smoker     Social History   Socioeconomic History  . Marital status: Married    Spouse name: Kennyth Lose  . Number of children: 3  . Years of education: Not on file  . Highest education level: 12th grade  Occupational History  . Occupation: Designer, multimedia    Comment: Dance movement psychotherapist  Tobacco Use  . Smoking status: Former Smoker    Packs/day: 0.50    Types: Cigars    Start date: 04/30/2012    Quit date: 07/01/2013    Years since quitting: 5.9  .  Smokeless tobacco: Never Used  Substance and Sexual Activity  . Alcohol use: Yes    Alcohol/week: 0.0 standard drinks    Comment: occasionaly  . Drug use: Yes    Types: Marijuana    Comment: smoked 06/20/19  . Sexual activity: Not on file  Other Topics Concern  . Not on file  Social History Narrative   Married   Alcohol use-no      Current Smoker   Occupation: Architect         Pt quit smoking cigarettes and cigars with Chantix. Does use cannibus. Drinks 2-3 cuos of coffee and 2-3 glasses of tea a day. No regular exercise, though is active at work. He is an only child.        Social Determinants of Health   Financial Resource Strain:   . Difficulty of Paying Living Expenses:   Food Insecurity:   . Worried About Charity fundraiser in the Last Year:   . Arboriculturist in the Last Year:   Transportation Needs:   . Film/video editor (Medical):   Marland Kitchen Lack of Transportation (Non-Medical):   Physical Activity:   . Days of Exercise per Week:   . Minutes of Exercise  per Session:   Stress:   . Feeling of Stress :   Social Connections:   . Frequency of Communication with Friends and Family:   . Frequency of Social Gatherings with Friends and Family:   . Attends Religious Services:   . Active Member of Clubs or Organizations:   . Attends Archivist Meetings:   Marland Kitchen Marital Status:   Intimate Partner Violence:   . Fear of Current or Ex-Partner:   . Emotionally Abused:   Marland Kitchen Physically Abused:   . Sexually Abused:     Past Surgical History:  Procedure Laterality Date  . RESECTION DISTAL CLAVICAL Left 08/13/2015   Procedure: RESECTION DISTAL CLAVICAL;  Surgeon: Ninetta Lights, MD;  Location: [place];  Service: Orthopedics;  Laterality: Left;  . SHOULDER ARTHROSCOPY WITH ROTATOR CUFF REPAIR AND SUBACROMIAL DECOMPRESSION Left 08/13/2015   Procedure: LEFT SHOULDER ARTHROSCOPY DEBRIDEMENT ACROMIOPLASTY, DISTAL CLAVICAL EXCISION ROTATOR CUFF REPAIR ;   Surgeon: Ninetta Lights, MD;  Location: [place];  Service: Orthopedics;  Laterality: Left;  . TONSILLECTOMY    . UPPER GASTROINTESTINAL ENDOSCOPY     2012,2013,2015 [facility]     Family History  Problem Relation Age of Onset  . Hypertension Mother   . Glaucoma Father   . Diabetes Maternal Grandmother   . Stroke Maternal Grandmother   . Colon cancer Paternal Grandfather   . Other Neg Hx        no family history of colon cancer  . Stomach cancer Neg Hx   . Esophageal cancer Neg Hx   . Prostate cancer Neg Hx   . Colon polyps Neg Hx   . Rectal cancer Neg Hx     No Known Allergies  Current Outpatient Medications on File Prior to Visit  Medication Sig Dispense Refill  . amLODipine (NORVASC) 5 MG tablet TAKE 1 TABLET BY MOUTH EVERY DAY 90 tablet 0  . aspirin EC 81 MG tablet Take 81 mg by mouth daily.    Marland Kitchen labetalol (NORMODYNE) 300 MG tablet TAKE 1 TABLET BY MOUTH TWICE A DAY 180 tablet 1  . Multiple Vitamins-Minerals (CENTRUM ADULTS PO) Take by mouth.    Marland Kitchen omeprazole (PRILOSEC) 20 MG capsule TAKE 1 CAPSULE BY MOUTH EVERY DAY 90 capsule 1  . simvastatin (ZOCOR) 10 MG tablet TAKE 1 TABLET BY MOUTH EVERY DAY 90 tablet 1  . varenicline (CHANTIX STARTING MONTH PAK) 0.5 MG X 11 & 1 MG X 42 tablet Take one 0.5 mg tablet by mouth once daily for 3 days, then increase to one 0.5 mg tablet twice daily for 4 days, then increase to one 1 mg tablet twice daily. 53 tablet 0   Current Facility-Administered Medications on File Prior to Visit  Medication Dose Route Frequency Provider Last Rate Last Admin  . 0.9 %  sodium chloride infusion  500 mL Intravenous Once Milus Banister, MD        BP (!) 140/100   Temp 98.3 F (36.8 C)   Wt 221 lb (100.2 kg)   BMI 29.97 kg/m       Objective:   Physical Exam Vitals and nursing note reviewed.  Constitutional:      Appearance: Normal appearance.  Cardiovascular:     Rate and Rhythm: Normal rate and regular rhythm.     Pulses:  Normal pulses.     Heart sounds: Normal heart sounds.  Pulmonary:     Effort: Pulmonary effort is normal.     Breath  sounds: Normal breath sounds.  Musculoskeletal:        General: Normal range of motion.     Right lower leg: Edema present.     Left lower leg: Edema present.  Skin:    General: Skin is warm and dry.     Capillary Refill: Capillary refill takes less than 2 seconds.  Neurological:     General: No focal deficit present.     Mental Status: He is alert and oriented to person, place, and time.  Psychiatric:        Mood and Affect: Mood normal.        Behavior: Behavior normal.        Thought Content: Thought content normal.        Judgment: Judgment normal.       Assessment & Plan:  1. Essential hypertension - Will add HCTZ 12.5 mg as he does have some mild lower extremity edema  - Encouraged low salt diet  - Follow up in 2 weeks  - hydrochlorothiazide (MICROZIDE) 12.5 MG capsule; Take 1 capsule (12.5 mg total) by mouth daily.  Dispense: 30 capsule; Refill: 1  Dorothyann Peng, NP

## 2019-07-08 ENCOUNTER — Other Ambulatory Visit: Payer: Self-pay | Admitting: Adult Health

## 2019-07-08 DIAGNOSIS — F17200 Nicotine dependence, unspecified, uncomplicated: Secondary | ICD-10-CM

## 2019-07-08 DIAGNOSIS — K219 Gastro-esophageal reflux disease without esophagitis: Secondary | ICD-10-CM

## 2019-07-08 DIAGNOSIS — K2100 Gastro-esophageal reflux disease with esophagitis, without bleeding: Secondary | ICD-10-CM

## 2019-07-10 DIAGNOSIS — K219 Gastro-esophageal reflux disease without esophagitis: Secondary | ICD-10-CM | POA: Insufficient documentation

## 2019-07-11 ENCOUNTER — Other Ambulatory Visit: Payer: Self-pay

## 2019-07-11 NOTE — Progress Notes (Deleted)
Subjective:    Patient ID: Scott Carson, male    DOB: 1970-05-26, 49 y.o.   MRN: [id number]  HPI  He presents to the office today for 2-week follow-up regarding essential hypertension.  He recently had a colonoscopy and it was noticed that his blood pressure was elevated.  He was taking his medications which included labetalol 300 mg twice daily and Norvasc 5 mg daily on a daily basis.  In the office at that time his blood pressure was 140/100, he was started on HCTZ 12.5 mg for better blood pressure control.  Review of Systems See HPI   Past Medical History:  Diagnosis Date  . Arthritis    BACK  . CVA (cerebral vascular accident) ([place]) 2020  . Eczema of hand   . Hypertension   . Low back pain   . Smoker     Social History   Socioeconomic History  . Marital status: Married    Spouse name: Kennyth Lose  . Number of children: 3  . Years of education: Not on file  . Highest education level: 12th grade  Occupational History  . Occupation: Designer, multimedia    Comment: Dance movement psychotherapist  Tobacco Use  . Smoking status: Former Smoker    Packs/day: 0.50    Types: Cigars    Start date: 04/30/2012    Quit date: 07/01/2013    Years since quitting: 6.0  . Smokeless tobacco: Never Used  Substance and Sexual Activity  . Alcohol use: Yes    Alcohol/week: 0.0 standard drinks    Comment: occasionaly  . Drug use: Yes    Types: Marijuana    Comment: smoked 06/20/19  . Sexual activity: Not on file  Other Topics Concern  . Not on file  Social History Narrative   Married   Alcohol use-no      Current Smoker   Occupation: Architect         Pt quit smoking cigarettes and cigars with Chantix. Does use cannibus. Drinks 2-3 cuos of coffee and 2-3 glasses of tea a day. No regular exercise, though is active at work. He is an only child.        Social Determinants of Health   Financial Resource Strain:   . Difficulty of Paying Living Expenses:   Food Insecurity:   . Worried About Paediatric nurse in the Last Year:   . Arboriculturist in the Last Year:   Transportation Needs:   . Film/video editor (Medical):   Marland Kitchen Lack of Transportation (Non-Medical):   Physical Activity:   . Days of Exercise per Week:   . Minutes of Exercise per Session:   Stress:   . Feeling of Stress :   Social Connections:   . Frequency of Communication with Friends and Family:   . Frequency of Social Gatherings with Friends and Family:   . Attends Religious Services:   . Active Member of Clubs or Organizations:   . Attends Archivist Meetings:   Marland Kitchen Marital Status:   Intimate Partner Violence:   . Fear of Current or Ex-Partner:   . Emotionally Abused:   Marland Kitchen Physically Abused:   . Sexually Abused:     Past Surgical History:  Procedure Laterality Date  . RESECTION DISTAL CLAVICAL Left 08/13/2015   Procedure: RESECTION DISTAL CLAVICAL;  Surgeon: Ninetta Lights, MD;  Location: [place];  Service: Orthopedics;  Laterality: Left;  . SHOULDER ARTHROSCOPY WITH ROTATOR CUFF REPAIR AND  SUBACROMIAL DECOMPRESSION Left 08/13/2015   Procedure: LEFT SHOULDER ARTHROSCOPY DEBRIDEMENT ACROMIOPLASTY, DISTAL CLAVICAL EXCISION ROTATOR CUFF REPAIR ;  Surgeon: Ninetta Lights, MD;  Location: [place];  Service: Orthopedics;  Laterality: Left;  . TONSILLECTOMY    . UPPER GASTROINTESTINAL ENDOSCOPY     2012,2013,2015 [facility]     Family History  Problem Relation Age of Onset  . Hypertension Mother   . Glaucoma Father   . Diabetes Maternal Grandmother   . Stroke Maternal Grandmother   . Colon cancer Paternal Grandfather   . Other Neg Hx        no family history of colon cancer  . Stomach cancer Neg Hx   . Esophageal cancer Neg Hx   . Prostate cancer Neg Hx   . Colon polyps Neg Hx   . Rectal cancer Neg Hx     No Known Allergies  Current Outpatient Medications on File Prior to Visit  Medication Sig Dispense Refill  . amLODipine (NORVASC) 5 MG tablet TAKE 1  TABLET BY MOUTH EVERY DAY 90 tablet 0  . aspirin EC 81 MG tablet Take 81 mg by mouth daily.    . CHANTIX 1 MG tablet TAKE 1 TABLET BY MOUTH TWICE A DAY 180 tablet 1  . hydrochlorothiazide (MICROZIDE) 12.5 MG capsule Take 1 capsule (12.5 mg total) by mouth daily. 30 capsule 1  . labetalol (NORMODYNE) 300 MG tablet TAKE 1 TABLET BY MOUTH TWICE A DAY 180 tablet 1  . Multiple Vitamins-Minerals (CENTRUM ADULTS PO) Take by mouth.    Marland Kitchen omeprazole (PRILOSEC) 20 MG capsule TAKE 1 CAPSULE BY MOUTH EVERY DAY 90 capsule 0  . simvastatin (ZOCOR) 10 MG tablet TAKE 1 TABLET BY MOUTH EVERY DAY 90 tablet 1  . varenicline (CHANTIX STARTING MONTH PAK) 0.5 MG X 11 & 1 MG X 42 tablet Take one 0.5 mg tablet by mouth once daily for 3 days, then increase to one 0.5 mg tablet twice daily for 4 days, then increase to one 1 mg tablet twice daily. 53 tablet 0   Current Facility-Administered Medications on File Prior to Visit  Medication Dose Route Frequency Provider Last Rate Last Admin  . 0.9 %  sodium chloride infusion  500 mL Intravenous Once Milus Banister, MD        There were no vitals taken for this visit.      Objective:   Physical Exam Vitals and nursing note reviewed.  Constitutional:      Appearance: Normal appearance.  Cardiovascular:     Rate and Rhythm: Normal rate and regular rhythm.     Pulses: Normal pulses.     Heart sounds: Normal heart sounds.  Pulmonary:     Effort: Pulmonary effort is normal.     Breath sounds: Normal breath sounds.  Musculoskeletal:        General: Normal range of motion.     Right lower leg: No edema.     Left lower leg: No edema.  Skin:    General: Skin is warm and dry.  Neurological:     General: No focal deficit present.     Mental Status: He is alert and oriented to person, place, and time.  Psychiatric:        Mood and Affect: Mood normal.        Behavior: Behavior normal.        Thought Content: Thought content normal.        Judgment: Judgment normal.  Assessment & Plan:

## 2019-07-12 ENCOUNTER — Ambulatory Visit: Payer: BC Managed Care – PPO | Admitting: Adult Health

## 2019-07-16 ENCOUNTER — Ambulatory Visit: Payer: BC Managed Care – PPO | Admitting: Adult Health

## 2019-07-21 ENCOUNTER — Other Ambulatory Visit: Payer: Self-pay | Admitting: Adult Health

## 2019-07-21 DIAGNOSIS — I10 Essential (primary) hypertension: Secondary | ICD-10-CM

## 2019-07-24 NOTE — Telephone Encounter (Signed)
APPT ON 07/26/19.  WILL HOLD

## 2019-07-26 ENCOUNTER — Other Ambulatory Visit: Payer: Self-pay

## 2019-07-26 ENCOUNTER — Encounter: Payer: Self-pay | Admitting: Adult Health

## 2019-07-26 ENCOUNTER — Ambulatory Visit (INDEPENDENT_AMBULATORY_CARE_PROVIDER_SITE_OTHER): Payer: BC Managed Care – PPO | Admitting: Adult Health

## 2019-07-26 DIAGNOSIS — I10 Essential (primary) hypertension: Secondary | ICD-10-CM | POA: Diagnosis not present

## 2019-07-26 LAB — BASIC METABOLIC PANEL
BUN: 14 mg/dL (ref 6–23)
CO2: 30 mEq/L (ref 19–32)
Calcium: 9.5 mg/dL (ref 8.4–10.5)
Chloride: 105 mEq/L (ref 96–112)
Creatinine, Ser: 1.03 mg/dL (ref 0.40–1.50)
GFR: 92.41 mL/min (ref >60.00)
Glucose, Bld: 69 mg/dL — ABNORMAL LOW (ref 70–99)
Potassium: 3.5 mEq/L (ref 3.5–5.1)
Sodium: 140 mEq/L (ref 135–145)

## 2019-07-26 MED ORDER — HYDROCHLOROTHIAZIDE 25 MG PO TABS: 25.0000 mg | ORAL_TABLET | Freq: Every day | ORAL | 0 refills | Status: DC

## 2019-07-26 MED ORDER — AMLODIPINE BESYLATE 10 MG PO TABS: 5.0000 mg | ORAL_TABLET | Freq: Every day | ORAL | 3 refills | Status: DC

## 2019-07-26 NOTE — Patient Instructions (Signed)
I am going to increase your Norvasc from 5 mg to 10 mg   I am also going to increase HCTZ to 25 mg   Please drink plenty of water and cut back on salt

## 2019-07-26 NOTE — Progress Notes (Signed)
Subjective:    Patient ID: Scott Carson, male    DOB: 1970-04-28, 49 y.o.   MRN: [id number]  HPI  49 year old male who  has a past medical history of Arthritis, CVA (cerebral vascular accident) ([place]) (2020), Eczema of hand, Hypertension, Low back pain, and Smoker.  Presents to the office today for follow-up regarding hypertension.  He was last seen in the office on 06/28/2019 at which time hydrochlorothiazide 12.5 mg was added to his regimen of Norvasc 5 mg daily and labetalol 300 mg twice daily. Since starting HCTZ he has not noticed any side effects. He is urinating more. His diet still consists of foods high in sodium. He had a bag of pork skins on the way here.   BP Readings from Last 3 Encounters:  07/26/19 (!) 140/110  06/28/19 (!) 140/100  06/21/19 (!) 142/94    Review of Systems  See HPI   Past Medical History:  Diagnosis Date  . Arthritis    BACK  . CVA (cerebral vascular accident) ([place]) 2020  . Eczema of hand   . Hypertension   . Low back pain   . Smoker     Social History   Socioeconomic History  . Marital status: Married    Spouse name: Kennyth Lose  . Number of children: 3  . Years of education: Not on file  . Highest education level: 12th grade  Occupational History  . Occupation: Designer, multimedia    Comment: Dance movement psychotherapist  Tobacco Use  . Smoking status: Former Smoker    Packs/day: 0.50    Types: Cigars    Start date: 04/30/2012    Quit date: 07/01/2013    Years since quitting: 6.0  . Smokeless tobacco: Never Used  Substance and Sexual Activity  . Alcohol use: Yes    Alcohol/week: 0.0 standard drinks    Comment: occasionaly  . Drug use: Yes    Types: Marijuana    Comment: smoked 06/20/19  . Sexual activity: Not on file  Other Topics Concern  . Not on file  Social History Narrative   Married   Alcohol use-no      Current Smoker   Occupation: Architect         Pt quit smoking cigarettes and cigars with Chantix. Does use cannibus. Drinks 2-3  cuos of coffee and 2-3 glasses of tea a day. No regular exercise, though is active at work. He is an only child.        Social Determinants of Health   Financial Resource Strain:   . Difficulty of Paying Living Expenses:   Food Insecurity:   . Worried About Charity fundraiser in the Last Year:   . Arboriculturist in the Last Year:   Transportation Needs:   . Film/video editor (Medical):   Marland Kitchen Lack of Transportation (Non-Medical):   Physical Activity:   . Days of Exercise per Week:   . Minutes of Exercise per Session:   Stress:   . Feeling of Stress :   Social Connections:   . Frequency of Communication with Friends and Family:   . Frequency of Social Gatherings with Friends and Family:   . Attends Religious Services:   . Active Member of Clubs or Organizations:   . Attends Archivist Meetings:   Marland Kitchen Marital Status:   Intimate Partner Violence:   . Fear of Current or Ex-Partner:   . Emotionally Abused:   Marland Kitchen Physically Abused:   .  Sexually Abused:     Past Surgical History:  Procedure Laterality Date  . RESECTION DISTAL CLAVICAL Left 08/13/2015   Procedure: RESECTION DISTAL CLAVICAL;  Surgeon: Ninetta Lights, MD;  Location: [place];  Service: Orthopedics;  Laterality: Left;  . SHOULDER ARTHROSCOPY WITH ROTATOR CUFF REPAIR AND SUBACROMIAL DECOMPRESSION Left 08/13/2015   Procedure: LEFT SHOULDER ARTHROSCOPY DEBRIDEMENT ACROMIOPLASTY, DISTAL CLAVICAL EXCISION ROTATOR CUFF REPAIR ;  Surgeon: Ninetta Lights, MD;  Location: [place];  Service: Orthopedics;  Laterality: Left;  . TONSILLECTOMY    . UPPER GASTROINTESTINAL ENDOSCOPY     2012,2013,2015 [facility]     Family History  Problem Relation Age of Onset  . Hypertension Mother   . Glaucoma Father   . Diabetes Maternal Grandmother   . Stroke Maternal Grandmother   . Colon cancer Paternal Grandfather   . Other Neg Hx        no family history of colon cancer  . Stomach cancer Neg  Hx   . Esophageal cancer Neg Hx   . Prostate cancer Neg Hx   . Colon polyps Neg Hx   . Rectal cancer Neg Hx     No Known Allergies  Current Outpatient Medications on File Prior to Visit  Medication Sig Dispense Refill  . aspirin EC 81 MG tablet Take 81 mg by mouth daily.    . hydrochlorothiazide (MICROZIDE) 12.5 MG capsule Take 1 capsule (12.5 mg total) by mouth daily. 30 capsule 1  . labetalol (NORMODYNE) 300 MG tablet TAKE 1 TABLET BY MOUTH TWICE A DAY 180 tablet 1  . Multiple Vitamins-Minerals (CENTRUM ADULTS PO) Take by mouth.    Marland Kitchen omeprazole (PRILOSEC) 20 MG capsule TAKE 1 CAPSULE BY MOUTH EVERY DAY 90 capsule 0  . simvastatin (ZOCOR) 10 MG tablet TAKE 1 TABLET BY MOUTH EVERY DAY 90 tablet 1   Current Facility-Administered Medications on File Prior to Visit  Medication Dose Route Frequency Provider Last Rate Last Admin  . 0.9 %  sodium chloride infusion  500 mL Intravenous Once Milus Banister, MD        BP (!) 140/110   Temp 98.4 F (36.9 C)   Wt 222 lb (100.7 kg)   BMI 30.11 kg/m       Objective:   Physical Exam Vitals and nursing note reviewed.  Constitutional:      Appearance: Normal appearance.  Cardiovascular:     Rate and Rhythm: Regular rhythm.     Pulses: Normal pulses.     Heart sounds: Normal heart sounds.  Pulmonary:     Effort: Pulmonary effort is normal.     Breath sounds: Normal breath sounds.  Neurological:     General: No focal deficit present.     Mental Status: He is alert and oriented to person, place, and time.  Psychiatric:        Mood and Affect: Mood normal.        Behavior: Behavior normal.        Thought Content: Thought content normal.        Judgment: Judgment normal.       Assessment & Plan:  1. Essential hypertension - No change in BP. Will max out norvasc and HCTZ. Needs to cut back on sodium. Follow up in 2 weeks. Check BP at home.  - amLODipine (NORVASC) 10 MG tablet; Take 0.5 tablets (5 mg total) by mouth daily.   Dispense: 90 tablet; Refill: 3 - hydrochlorothiazide (HYDRODIURIL) 25 MG tablet; Take  1 tablet (25 mg total) by mouth daily.  Dispense: 30 tablet; Refill: 0 - Basic Metabolic Panel  Dorothyann Peng, NP

## 2019-08-08 ENCOUNTER — Other Ambulatory Visit: Payer: Self-pay

## 2019-08-09 ENCOUNTER — Ambulatory Visit: Payer: BC Managed Care – PPO | Admitting: Adult Health

## 2019-08-09 ENCOUNTER — Encounter: Payer: Self-pay | Admitting: Adult Health

## 2019-08-09 VITALS — BP 136/100 | Temp 98.0°F | Wt 223.0 lb

## 2019-08-09 DIAGNOSIS — D492 Neoplasm of unspecified behavior of bone, soft tissue, and skin: Secondary | ICD-10-CM

## 2019-08-09 DIAGNOSIS — B079 Viral wart, unspecified: Secondary | ICD-10-CM | POA: Diagnosis not present

## 2019-08-09 DIAGNOSIS — R351 Nocturia: Secondary | ICD-10-CM | POA: Diagnosis not present

## 2019-08-09 DIAGNOSIS — I10 Essential (primary) hypertension: Secondary | ICD-10-CM

## 2019-08-09 DIAGNOSIS — N401 Enlarged prostate with lower urinary tract symptoms: Secondary | ICD-10-CM | POA: Diagnosis not present

## 2019-08-09 MED ORDER — AMLODIPINE BESYLATE 10 MG PO TABS: 10.0000 mg | ORAL_TABLET | Freq: Every day | ORAL | 2 refills | Status: DC

## 2019-08-09 MED ORDER — TAMSULOSIN HCL 0.4 MG PO CAPS: 0.4000 mg | ORAL_CAPSULE | Freq: Every day | ORAL | 0 refills | Status: DC

## 2019-08-09 NOTE — Progress Notes (Signed)
Subjective:    Patient ID: Scott Carson, male    DOB: 1970-11-03, 49 y.o.   MRN: [id number]  HPI  49  Year old male who  has a past medical history of Arthritis, CVA (cerebral vascular accident) ([place]) (2020), Eczema of hand, Hypertension, Low back pain, and Smoker.  He presents to the office today for 2-week follow-up regarding essential hypertension.  When he was last seen on 07/26/2019 during this visit his blood pressure continued to be high despite adding HCTZ 12.5 mg to his regimen.  At this time we increased his Norvasc to 10 mg from 5 mg and increase HCTZ to 25 mg daily.  He was kept on labetalol 300 mg twice daily.  He reports that he has cut back on salt.  Unfortunately when the prescription for Norvasc was sent and it stayed on the 5 mg dose which is what he has been taking.  He did pick a blood pressure cuff up and has been monitoring at home but does not know what his readings have been.  BP Readings from Last 3 Encounters:  08/09/19 (!) 136/100  07/26/19 (!) 140/110  06/28/19 (!) 140/100     He also reports worsening BPH symptoms which include nocturia which he is getting up 3-4 times a night, decreased stream, and incomplete bladder emptying.  He does feel as though it is time to go on some medication to help with his symptoms  Additionally he has a wart on his right index that he has been trying to remove with over-the-counter treatments.  These treatments have been ineffective.  Review of Systems See HPI   Past Medical History:  Diagnosis Date  . Arthritis    BACK  . CVA (cerebral vascular accident) ([place]) 2020  . Eczema of hand   . Hypertension   . Low back pain   . Smoker     Social History   Socioeconomic History  . Marital status: Married    Spouse name: Kennyth Lose  . Number of children: 3  . Years of education: Not on file  . Highest education level: 12th grade  Occupational History  . Occupation: Designer, multimedia    Comment: Dance movement psychotherapist  Tobacco  Use  . Smoking status: Former Smoker    Packs/day: 0.50    Types: Cigars    Start date: 04/30/2012    Quit date: 07/01/2013    Years since quitting: 6.1  . Smokeless tobacco: Never Used  Substance and Sexual Activity  . Alcohol use: Yes    Alcohol/week: 0.0 standard drinks    Comment: occasionaly  . Drug use: Yes    Types: Marijuana    Comment: smoked 06/20/19  . Sexual activity: Not on file  Other Topics Concern  . Not on file  Social History Narrative   Married   Alcohol use-no      Current Smoker   Occupation: Architect         Pt quit smoking cigarettes and cigars with Chantix. Does use cannibus. Drinks 2-3 cuos of coffee and 2-3 glasses of tea a day. No regular exercise, though is active at work. He is an only child.        Social Determinants of Health   Financial Resource Strain:   . Difficulty of Paying Living Expenses:   Food Insecurity:   . Worried About Charity fundraiser in the Last Year:   . Arboriculturist in the Last Year:   Transportation Needs:   .  Lack of Transportation (Medical):   Marland Kitchen Lack of Transportation (Non-Medical):   Physical Activity:   . Days of Exercise per Week:   . Minutes of Exercise per Session:   Stress:   . Feeling of Stress :   Social Connections:   . Frequency of Communication with Friends and Family:   . Frequency of Social Gatherings with Friends and Family:   . Attends Religious Services:   . Active Member of Clubs or Organizations:   . Attends Archivist Meetings:   Marland Kitchen Marital Status:   Intimate Partner Violence:   . Fear of Current or Ex-Partner:   . Emotionally Abused:   Marland Kitchen Physically Abused:   . Sexually Abused:     Past Surgical History:  Procedure Laterality Date  . RESECTION DISTAL CLAVICAL Left 08/13/2015   Procedure: RESECTION DISTAL CLAVICAL;  Surgeon: Ninetta Lights, MD;  Location: [place] [facility];  Service: Orthopedics;  Laterality: Left;  . SHOULDER ARTHROSCOPY WITH ROTATOR CUFF REPAIR  AND SUBACROMIAL DECOMPRESSION Left 08/13/2015   Procedure: LEFT SHOULDER ARTHROSCOPY DEBRIDEMENT ACROMIOPLASTY, DISTAL CLAVICAL EXCISION ROTATOR CUFF REPAIR ;  Surgeon: Ninetta Lights, MD;  Location: [place];  Service: Orthopedics;  Laterality: Left;  . TONSILLECTOMY    . UPPER GASTROINTESTINAL ENDOSCOPY     2012,2013,2015 [facility]     Family History  Problem Relation Age of Onset  . Hypertension Mother   . Glaucoma Father   . Diabetes Maternal Grandmother   . Stroke Maternal Grandmother   . Colon cancer Paternal Grandfather   . Other Neg Hx        no family history of colon cancer  . Stomach cancer Neg Hx   . Esophageal cancer Neg Hx   . Prostate cancer Neg Hx   . Colon polyps Neg Hx   . Rectal cancer Neg Hx     No Known Allergies  Current Outpatient Medications on File Prior to Visit  Medication Sig Dispense Refill  . amLODipine (NORVASC) 10 MG tablet Take 0.5 tablets (5 mg total) by mouth daily. 90 tablet 3  . aspirin EC 81 MG tablet Take 81 mg by mouth daily.    . hydrochlorothiazide (HYDRODIURIL) 25 MG tablet Take 1 tablet (25 mg total) by mouth daily. 30 tablet 0  . hydrochlorothiazide (MICROZIDE) 12.5 MG capsule Take 1 capsule (12.5 mg total) by mouth daily. 30 capsule 1  . labetalol (NORMODYNE) 300 MG tablet TAKE 1 TABLET BY MOUTH TWICE A DAY 180 tablet 1  . Multiple Vitamins-Minerals (CENTRUM ADULTS PO) Take by mouth.    Marland Kitchen omeprazole (PRILOSEC) 20 MG capsule TAKE 1 CAPSULE BY MOUTH EVERY DAY 90 capsule 0  . simvastatin (ZOCOR) 10 MG tablet TAKE 1 TABLET BY MOUTH EVERY DAY 90 tablet 1   Current Facility-Administered Medications on File Prior to Visit  Medication Dose Route Frequency Provider Last Rate Last Admin  . 0.9 %  sodium chloride infusion  500 mL Intravenous Once Milus Banister, MD        There were no vitals taken for this visit.      Objective:   Physical Exam Vitals and nursing note reviewed.  Constitutional:      General: He is  not in acute distress.    Appearance: Normal appearance. He is well-developed and normal weight.  HENT:     Head: Normocephalic and atraumatic.  Neck:     Vascular: No carotid bruit.     Trachea: No tracheal deviation.  Cardiovascular:     Rate and Rhythm: Normal rate and regular rhythm.     Pulses: Normal pulses.     Heart sounds: Normal heart sounds. No murmur heard.  No friction rub. No gallop.   Pulmonary:     Effort: Pulmonary effort is normal. No respiratory distress.     Breath sounds: Normal breath sounds. No stridor. No wheezing, rhonchi or rales.  Chest:     Chest wall: No tenderness.  Abdominal:     Tenderness: There is no left CVA tenderness.  Musculoskeletal:        General: No swelling, tenderness, deformity or signs of injury. Normal range of motion.     Right lower leg: No edema.     Left lower leg: No edema.  Lymphadenopathy:     Cervical: No cervical adenopathy.  Skin:    General: Skin is warm and dry.     Capillary Refill: Capillary refill takes less than 2 seconds.     Coloration: Skin is not jaundiced or pale.     Findings: Lesion present. No bruising, erythema or rash.     Comments: Small wart noted on right index finger  Neurological:     General: No focal deficit present.     Mental Status: He is alert and oriented to person, place, and time.     Cranial Nerves: No cranial nerve deficit.     Sensory: No sensory deficit.     Motor: No weakness.     Coordination: Coordination normal.     Gait: Gait normal.     Deep Tendon Reflexes: Reflexes normal.  Psychiatric:        Mood and Affect: Mood normal.        Behavior: Behavior normal.        Thought Content: Thought content normal.        Judgment: Judgment normal.       Assessment & Plan:  1. Essential hypertension -I asked him to take the 10 mg of Norvasc daily.  He can finish what he has at home just take double the dose.  I will follow-up with him in 2 weeks to see what his blood pressures have  been at home. - amLODipine (NORVASC) 10 MG tablet; Take 1 tablet (10 mg total) by mouth daily.  Dispense: 90 tablet; Refill: 2  2. Benign prostatic hyperplasia with nocturia  - tamsulosin (FLOMAX) 0.4 MG CAPS capsule; Take 1 capsule (0.4 mg total) by mouth daily.  Dispense: 30 capsule; Refill: 0  3. Skin neoplasm -Verbal consent obtained and using cryotherapy destruction of the wart was performed.  Patient tolerated procedure well  Dorothyann Peng, NP

## 2019-08-21 ENCOUNTER — Other Ambulatory Visit: Payer: Self-pay | Admitting: Adult Health

## 2019-08-21 DIAGNOSIS — I10 Essential (primary) hypertension: Secondary | ICD-10-CM

## 2019-08-22 NOTE — Telephone Encounter (Signed)
Sent to the pharmacy by e-scribe. 

## 2019-08-23 ENCOUNTER — Encounter: Payer: Self-pay | Admitting: Adult Health

## 2019-09-04 ENCOUNTER — Other Ambulatory Visit: Payer: Self-pay | Admitting: Adult Health

## 2019-09-04 DIAGNOSIS — N401 Enlarged prostate with lower urinary tract symptoms: Secondary | ICD-10-CM

## 2019-09-04 NOTE — Telephone Encounter (Signed)
SENT TO THE PHARMACY BY E-SCRIBE. 

## 2019-09-18 ENCOUNTER — Other Ambulatory Visit: Payer: Self-pay | Admitting: Adult Health

## 2019-09-18 DIAGNOSIS — K2100 Gastro-esophageal reflux disease with esophagitis, without bleeding: Secondary | ICD-10-CM

## 2019-09-19 NOTE — Telephone Encounter (Signed)
Sent to the pharmacy by e-scribe. 

## 2019-12-07 ENCOUNTER — Other Ambulatory Visit: Payer: Self-pay | Admitting: Adult Health

## 2019-12-12 ENCOUNTER — Other Ambulatory Visit: Payer: Self-pay | Admitting: Adult Health

## 2019-12-12 DIAGNOSIS — E782 Mixed hyperlipidemia: Secondary | ICD-10-CM

## 2019-12-21 ENCOUNTER — Ambulatory Visit: Payer: BC Managed Care – PPO

## 2020-01-25 ENCOUNTER — Ambulatory Visit: Payer: Self-pay | Attending: Internal Medicine

## 2020-01-25 DIAGNOSIS — Z23 Encounter for immunization: Secondary | ICD-10-CM

## 2020-01-25 NOTE — Progress Notes (Signed)
   Covid-19 Vaccination Clinic  Name:  Scott Carson    MRN: [id number] DOB: 05/12/1970  01/25/2020  [name] was observed post Covid-19 immunization for 15 minutes without incident. He was provided with Vaccine Information Sheet and instruction to access the V-Safe system.   [name] was instructed to call 911 with any severe reactions post vaccine: Marland Kitchen Difficulty breathing  . Swelling of face and throat  . A fast heartbeat  . A bad rash all over body  . Dizziness and weakness   Immunizations Administered    Name Date Dose VIS Date Route   Pfizer COVID-19 Vaccine 01/25/2020  1:12 PM 0.3 mL 12/11/2019 Intramuscular   Manufacturer: [place]   Lot: [id number]   NDC: [address]-1

## 2020-02-26 ENCOUNTER — Ambulatory Visit: Payer: BC Managed Care – PPO | Admitting: Adult Health

## 2020-02-26 DIAGNOSIS — Z0289 Encounter for other administrative examinations: Secondary | ICD-10-CM

## 2020-03-05 ENCOUNTER — Encounter: Payer: Self-pay | Admitting: Adult Health

## 2020-03-05 ENCOUNTER — Other Ambulatory Visit: Payer: Self-pay

## 2020-03-05 ENCOUNTER — Ambulatory Visit: Payer: BC Managed Care – PPO | Admitting: Adult Health

## 2020-03-05 VITALS — BP 118/90 | Temp 97.7°F | Wt 218.0 lb

## 2020-03-05 DIAGNOSIS — Z23 Encounter for immunization: Secondary | ICD-10-CM

## 2020-03-05 DIAGNOSIS — R55 Syncope and collapse: Secondary | ICD-10-CM | POA: Diagnosis not present

## 2020-03-05 DIAGNOSIS — R002 Palpitations: Secondary | ICD-10-CM | POA: Diagnosis not present

## 2020-03-05 DIAGNOSIS — R079 Chest pain, unspecified: Secondary | ICD-10-CM | POA: Diagnosis not present

## 2020-03-05 LAB — COMPREHENSIVE METABOLIC PANEL
ALT: 20 U/L (ref 0–53)
AST: 27 U/L (ref 0–37)
Albumin: 4.6 g/dL (ref 3.5–5.2)
Alkaline Phosphatase: 63 U/L (ref 39–117)
BUN: 12 mg/dL (ref 6–23)
CO2: 31 mEq/L (ref 19–32)
Calcium: 9.6 mg/dL (ref 8.4–10.5)
Chloride: 102 mEq/L (ref 96–112)
Creatinine, Ser: 1.08 mg/dL (ref 0.40–1.50)
GFR: 79.83 mL/min (ref >60.00)
Glucose, Bld: 88 mg/dL (ref 70–99)
Potassium: 3.7 mEq/L (ref 3.5–5.1)
Sodium: 138 mEq/L (ref 135–145)
Total Bilirubin: 1 mg/dL (ref 0.2–1.2)
Total Protein: 7.2 g/dL (ref 6.0–8.3)

## 2020-03-05 LAB — CBC WITH DIFFERENTIAL/PLATELET
Basophils Absolute: 0 10*3/uL (ref 0.0–0.1)
Basophils Relative: 0.1 % (ref 0.0–3.0)
Eosinophils Absolute: 0 10*3/uL (ref 0.0–0.7)
Eosinophils Relative: 0.8 % (ref 0.0–5.0)
HCT: 42.3 % (ref 39.0–52.0)
Hemoglobin: 14.2 g/dL (ref 13.0–17.0)
Lymphocytes Relative: 41.6 % (ref 12.0–46.0)
Lymphs Abs: 2.3 10*3/uL (ref 0.7–4.0)
MCHC: 33.5 g/dL (ref 30.0–36.0)
MCV: 88.6 fl (ref 78.0–100.0)
Monocytes Absolute: 0.5 10*3/uL (ref 0.1–1.0)
Monocytes Relative: 8.2 % (ref 3.0–12.0)
Neutro Abs: 2.7 10*3/uL (ref 1.4–7.7)
Neutrophils Relative %: 49.3 % (ref 43.0–77.0)
Platelets: 271 10*3/uL (ref 150.0–400.0)
RBC: 4.78 Mil/uL (ref 4.22–5.81)
RDW: 13.8 % (ref 11.5–15.5)
WBC: 5.6 10*3/uL (ref 4.0–10.5)

## 2020-03-05 NOTE — Progress Notes (Signed)
Subjective:    Patient ID: Scott Carson, male    DOB: 02-10-1971, 50 y.o.   MRN: [id number]  HPI 50 year old male who  has a past medical history of Arthritis, CVA (cerebral vascular accident) ([place]) (2020), Eczema of hand, Hypertension, Low back pain, and Smoker.  He presents to the office today for for an acute issue. He reports that about 2 weeks ago he was getting out of bed around 10 AM in the morning and as he was walking to the bathroom he had a syncopal episode and collapsed into a bedroom door. He does not remember much about the incident but does deny dizziness, lightheadedness, or chest pain during the incident. He then remembers his wife waking him up and he was on the floor. Does believe he hit his head on something possibly the door.  Since that time he has not had no other syncopal episodes but about 2-3 times a week he will get a feeling of dizziness or lightheadedness. This is when he is up walking around at a normal pace, when he sits down the symptoms go away. He also endorses right-sided chest pain that sharp in nature that lasts just a few seconds as well as feeling of fluttering in his chest. None of the symptoms coincide but do happen multiple times a week.  Is taking all of his medications including hydrochlorothiazide 25 mg, Norvasc 10 mg, and labetalol 300 mg twice daily. Fortunately he does not monitor his blood pressure at home  BP Readings from Last 3 Encounters:  03/05/20 118/90  08/09/19 (!) 136/100  07/26/19 (!) 140/110    Review of Systems See HPI   Past Medical History:  Diagnosis Date  . Arthritis    BACK  . CVA (cerebral vascular accident) ([place]) 2020  . Eczema of hand   . Hypertension   . Low back pain   . Smoker     Social History   Socioeconomic History  . Marital status: Married    Spouse name: Kennyth Lose  . Number of children: 3  . Years of education: Not on file  . Highest education level: 12th grade  Occupational History  . Occupation:  Designer, multimedia    Comment: Dance movement psychotherapist  Tobacco Use  . Smoking status: Former Smoker    Packs/day: 0.50    Types: Cigars    Start date: 04/30/2012    Quit date: 07/01/2013    Years since quitting: 6.6  . Smokeless tobacco: Never Used  Substance and Sexual Activity  . Alcohol use: Yes    Alcohol/week: 0.0 standard drinks    Comment: occasionaly  . Drug use: Yes    Types: Marijuana    Comment: smoked 06/20/19  . Sexual activity: Not on file  Other Topics Concern  . Not on file  Social History Narrative   Married   Alcohol use-no      Current Smoker   Occupation: Architect         Pt quit smoking cigarettes and cigars with Chantix. Does use cannibus. Drinks 2-3 cuos of coffee and 2-3 glasses of tea a day. No regular exercise, though is active at work. He is an only child.        Social Determinants of Health   Financial Resource Strain: Not on file  Food Insecurity: Not on file  Transportation Needs: Not on file  Physical Activity: Not on file  Stress: Not on file  Social Connections: Not on file  Intimate Partner  Violence: Not on file    Past Surgical History:  Procedure Laterality Date  . RESECTION DISTAL CLAVICAL Left 08/13/2015   Procedure: RESECTION DISTAL CLAVICAL;  Surgeon: Ninetta Lights, MD;  Location: [place];  Service: Orthopedics;  Laterality: Left;  . SHOULDER ARTHROSCOPY WITH ROTATOR CUFF REPAIR AND SUBACROMIAL DECOMPRESSION Left 08/13/2015   Procedure: LEFT SHOULDER ARTHROSCOPY DEBRIDEMENT ACROMIOPLASTY, DISTAL CLAVICAL EXCISION ROTATOR CUFF REPAIR ;  Surgeon: Ninetta Lights, MD;  Location: [place];  Service: Orthopedics;  Laterality: Left;  . TONSILLECTOMY    . UPPER GASTROINTESTINAL ENDOSCOPY     2012,2013,2015 [facility]     Family History  Problem Relation Age of Onset  . Hypertension Mother   . Glaucoma Father   . Diabetes Maternal Grandmother   . Stroke Maternal Grandmother   . Colon cancer Paternal  Grandfather   . Other Neg Hx        no family history of colon cancer  . Stomach cancer Neg Hx   . Esophageal cancer Neg Hx   . Prostate cancer Neg Hx   . Colon polyps Neg Hx   . Rectal cancer Neg Hx     No Known Allergies  Current Outpatient Medications on File Prior to Visit  Medication Sig Dispense Refill  . amLODipine (NORVASC) 10 MG tablet Take 1 tablet (10 mg total) by mouth daily. 90 tablet 2  . aspirin EC 81 MG tablet Take 81 mg by mouth daily.    . hydrochlorothiazide (HYDRODIURIL) 25 MG tablet TAKE 1 TABLET BY MOUTH EVERY DAY 90 tablet 1  . labetalol (NORMODYNE) 300 MG tablet TAKE 1 TABLET BY MOUTH TWICE A DAY 180 tablet 1  . Multiple Vitamins-Minerals (CENTRUM ADULTS PO) Take by mouth.    Marland Kitchen omeprazole (PRILOSEC) 20 MG capsule TAKE 1 CAPSULE BY MOUTH EVERY DAY 90 capsule 0  . simvastatin (ZOCOR) 10 MG tablet TAKE 1 TABLET BY MOUTH EVERY DAY 90 tablet 1  . tamsulosin (FLOMAX) 0.4 MG CAPS capsule TAKE 1 CAPSULE BY MOUTH EVERY DAY 90 capsule 1   Current Facility-Administered Medications on File Prior to Visit  Medication Dose Route Frequency Provider Last Rate Last Admin  . 0.9 %  sodium chloride infusion  500 mL Intravenous Once Milus Banister, MD        BP 118/90   Temp 97.7 F (36.5 C)   Wt 218 lb (98.9 kg)   BMI 29.57 kg/m       Objective:   Physical Exam Vitals and nursing note reviewed.  Constitutional:      Appearance: Normal appearance.  Neck:     Vascular: No carotid bruit.  Cardiovascular:     Rate and Rhythm: Normal rate and regular rhythm.     Pulses: Normal pulses.     Heart sounds: Normal heart sounds.  Pulmonary:     Effort: Pulmonary effort is normal.     Breath sounds: Normal breath sounds.  Musculoskeletal:        General: Normal range of motion.  Skin:    General: Skin is warm and dry.  Neurological:     General: No focal deficit present.     Mental Status: He is alert and oriented to person, place, and time.  Psychiatric:         Mood and Affect: Mood normal.        Behavior: Behavior normal.        Thought Content: Thought content normal.  Judgment: Judgment normal.       Assessment & Plan:  1. Syncope and collapse - unknown cause but possible hypotension related. - EKG 12-Lead- Sinus Brady with left atrial enlargement, Rate 59  - Will have him cut HCTZ in half over the next 2 weeks and monitor his BP at home  - Follow up in two weeks  - CBC with Differential/Platelet - Comprehensive metabolic panel - Cardiac event monitor; Future - US Carotid Duplex Bilateral; Future - Ambulatory referral to Cardiology  2. Palpitations  - Cardiac event monitor; Future - Ambulatory referral to Cardiology  3. Chest pain, unspecified type  - Cardiac event monitor; Future - Ambulatory referral to Cardiology   Dorothyann Peng, NP

## 2020-03-05 NOTE — Patient Instructions (Signed)
I am going to have you take 1/2 tab of the HCTZ over the next two weeks  I am also going to order you a heart monitor and ultrasound of the carotid arteries.   Will refer you to cardiology for further assessment as well   Follow up with me in two weeks

## 2020-03-09 ENCOUNTER — Telehealth: Payer: BC Managed Care – PPO | Admitting: Cardiology

## 2020-03-11 ENCOUNTER — Ambulatory Visit
Admission: RE | Admit: 2020-03-11 | Discharge: 2020-03-11 | Disposition: A | Discharge status: Home / Self Care | Payer: BC Managed Care – PPO | Source: Ambulatory Visit | Attending: Adult Health | Admitting: Adult Health

## 2020-03-11 DIAGNOSIS — R55 Syncope and collapse: Secondary | ICD-10-CM

## 2020-03-11 DIAGNOSIS — E782 Mixed hyperlipidemia: Secondary | ICD-10-CM | POA: Diagnosis not present

## 2020-03-11 DIAGNOSIS — I10 Essential (primary) hypertension: Secondary | ICD-10-CM | POA: Diagnosis not present

## 2020-03-11 DIAGNOSIS — Z87891 Personal history of nicotine dependence: Secondary | ICD-10-CM | POA: Diagnosis not present

## 2020-03-15 ENCOUNTER — Ambulatory Visit (INDEPENDENT_AMBULATORY_CARE_PROVIDER_SITE_OTHER): Payer: BC Managed Care – PPO

## 2020-03-15 DIAGNOSIS — R55 Syncope and collapse: Secondary | ICD-10-CM

## 2020-03-15 DIAGNOSIS — R002 Palpitations: Secondary | ICD-10-CM

## 2020-03-15 DIAGNOSIS — R079 Chest pain, unspecified: Secondary | ICD-10-CM

## 2020-03-18 ENCOUNTER — Other Ambulatory Visit: Payer: Self-pay

## 2020-03-19 ENCOUNTER — Encounter: Payer: Self-pay | Admitting: Adult Health

## 2020-03-19 ENCOUNTER — Ambulatory Visit: Payer: BC Managed Care – PPO | Admitting: Adult Health

## 2020-03-19 VITALS — BP 120/90 | Temp 98.1°F | Wt 217.0 lb

## 2020-03-19 DIAGNOSIS — R55 Syncope and collapse: Secondary | ICD-10-CM | POA: Diagnosis not present

## 2020-03-19 DIAGNOSIS — I10 Essential (primary) hypertension: Secondary | ICD-10-CM | POA: Diagnosis not present

## 2020-03-19 MED ORDER — HYDROCHLOROTHIAZIDE 12.5 MG PO CAPS
12.5000 mg | ORAL_CAPSULE | Freq: Every day | ORAL | 3 refills | Status: DC
Start: 2020-03-19 — End: 2020-03-25

## 2020-03-19 NOTE — Progress Notes (Signed)
Subjective:    Patient ID: Scott Carson, male    DOB: [birth year], 50 y.o.   MRN: [id number]  HPI 50 year old male who  has a past medical history of Arthritis, CVA (cerebral vascular accident) ([place]) (2020), Eczema of hand, Hypertension, Low back pain, and Smoker.  He presents to the office today for two week follow up regarding syncope and collapse that happened at the beginning of January.  When he was seen in the office originally he reported that he had no other syncopal episodes but about 2-3 times a week he was still feeling dizzy and lightheaded.  His EKGs showed sinus bradycardia with left atrial enlargement, carotid Doppler ultrasound was ordered as well as cardiac event monitoring and a follow-up with cardiology.  Is concerned that some of his symptoms were coming from transient episodes of hypotension so his HCTZ was decreased to 12.5 mg  His Carotid US showed   IMPRESSION: 1. Mild right carotid bifurcation plaque resulting in less than 50% diameter stenosis. 2. No significant left carotid plaque or stenosis. 3.  Antegrade bilateral vertebral arterial flow.  He is currently wearing his cardiac monitor and has a follow-up appointment with cardiology next week.  Today he reports that he has not had any syncope and collapse episodes nor has he experienced any lightheadedness or dizziness.  He is taking his blood pressure medication as directed.  BP Readings from Last 3 Encounters:  03/19/20 120/90  03/05/20 118/90  08/09/19 (!) 136/100    Review of Systems See HPI   Past Medical History:  Diagnosis Date  . Arthritis    BACK  . CVA (cerebral vascular accident) ([place]) 2020  . Eczema of hand   . Hypertension   . Low back pain   . Smoker     Social History   Socioeconomic History  . Marital status: Married    Spouse name: Kennyth Lose  . Number of children: 3  . Years of education: Not on file  . Highest education level: 12th grade  Occupational History  .  Occupation: Designer, multimedia    Comment: Dance movement psychotherapist  Tobacco Use  . Smoking status: Former Smoker    Packs/day: 0.50    Types: Cigars    Start date: 04/30/2012    Quit date: 07/01/2013    Years since quitting: 6.7  . Smokeless tobacco: Never Used  Substance and Sexual Activity  . Alcohol use: Yes    Alcohol/week: 0.0 standard drinks    Comment: occasionaly  . Drug use: Yes    Types: Marijuana    Comment: smoked 06/20/19  . Sexual activity: Not on file  Other Topics Concern  . Not on file  Social History Narrative   Married   Alcohol use-no      Current Smoker   Occupation: Architect         Pt quit smoking cigarettes and cigars with Chantix. Does use cannibus. Drinks 2-3 cuos of coffee and 2-3 glasses of tea a day. No regular exercise, though is active at work. He is an only child.        Social Determinants of Health   Financial Resource Strain: Not on file  Food Insecurity: Not on file  Transportation Needs: Not on file  Physical Activity: Not on file  Stress: Not on file  Social Connections: Not on file  Intimate Partner Violence: Not on file    Past Surgical History:  Procedure Laterality Date  . RESECTION DISTAL CLAVICAL Left 08/13/2015  Procedure: RESECTION DISTAL CLAVICAL;  Surgeon: Ninetta Lights, MD;  Location: [place];  Service: Orthopedics;  Laterality: Left;  . SHOULDER ARTHROSCOPY WITH ROTATOR CUFF REPAIR AND SUBACROMIAL DECOMPRESSION Left 08/13/2015   Procedure: LEFT SHOULDER ARTHROSCOPY DEBRIDEMENT ACROMIOPLASTY, DISTAL CLAVICAL EXCISION ROTATOR CUFF REPAIR ;  Surgeon: Ninetta Lights, MD;  Location: [facility];  Service: Orthopedics;  Laterality: Left;  . TONSILLECTOMY    . UPPER GASTROINTESTINAL ENDOSCOPY     2012,2013,2015 [facility]     Family History  Problem Relation Age of Onset  . Hypertension Mother   . Glaucoma Father   . Diabetes Maternal Grandmother   . Stroke Maternal Grandmother   . Colon cancer  Paternal Grandfather   . Other Neg Hx        no family history of colon cancer  . Stomach cancer Neg Hx   . Esophageal cancer Neg Hx   . Prostate cancer Neg Hx   . Colon polyps Neg Hx   . Rectal cancer Neg Hx     No Known Allergies  Current Outpatient Medications on File Prior to Visit  Medication Sig Dispense Refill  . amLODipine (NORVASC) 10 MG tablet Take 1 tablet (10 mg total) by mouth daily. 90 tablet 2  . aspirin EC 81 MG tablet Take 81 mg by mouth daily.    . hydrochlorothiazide (HYDRODIURIL) 25 MG tablet TAKE 1 TABLET BY MOUTH EVERY DAY 90 tablet 1  . labetalol (NORMODYNE) 300 MG tablet TAKE 1 TABLET BY MOUTH TWICE A DAY 180 tablet 1  . Multiple Vitamins-Minerals (CENTRUM ADULTS PO) Take by mouth.    Marland Kitchen omeprazole (PRILOSEC) 20 MG capsule TAKE 1 CAPSULE BY MOUTH EVERY DAY 90 capsule 0  . simvastatin (ZOCOR) 10 MG tablet TAKE 1 TABLET BY MOUTH EVERY DAY 90 tablet 1  . tamsulosin (FLOMAX) 0.4 MG CAPS capsule TAKE 1 CAPSULE BY MOUTH EVERY DAY 90 capsule 1   Current Facility-Administered Medications on File Prior to Visit  Medication Dose Route Frequency Provider Last Rate Last Admin  . 0.9 %  sodium chloride infusion  500 mL Intravenous Once Milus Banister, MD        BP 120/90   Temp 98.1 F (36.7 C)   Wt 217 lb (98.4 kg)   BMI 29.43 kg/m       Objective:   Physical Exam Vitals and nursing note reviewed.  Constitutional:      Appearance: Normal appearance.  Cardiovascular:     Rate and Rhythm: Normal rate and regular rhythm.     Pulses: Normal pulses.     Heart sounds: Normal heart sounds.  Skin:    General: Skin is warm and dry.  Neurological:     General: No focal deficit present.     Mental Status: He is alert and oriented to person, place, and time.  Psychiatric:        Mood and Affect: Mood normal.        Behavior: Behavior normal.        Thought Content: Thought content normal.        Judgment: Judgment normal.       Assessment & Plan:  1.  Essential hypertension - Stable with decrease of HCTZ. No further symptoms  - hydrochlorothiazide (MICROZIDE) 12.5 MG capsule; Take 1 capsule (12.5 mg total) by mouth daily.  Dispense: 90 capsule; Refill: 3  2. Syncope and collapse - No additional episodes.    Dorothyann Peng, NP

## 2020-03-20 ENCOUNTER — Encounter: Payer: Self-pay | Admitting: Adult Health

## 2020-03-21 ENCOUNTER — Other Ambulatory Visit: Payer: Self-pay | Admitting: Adult Health

## 2020-03-21 DIAGNOSIS — I10 Essential (primary) hypertension: Secondary | ICD-10-CM

## 2020-03-21 DIAGNOSIS — N401 Enlarged prostate with lower urinary tract symptoms: Secondary | ICD-10-CM

## 2020-03-24 NOTE — Telephone Encounter (Signed)
I HAVE SCHEDULED THE PT FOR A CPX.  TAMSULOSIN SENT TO THE PHARMACY.  HCTZ STRENGTH HAS BEEN CHANGED FROM 25MG  TO 12.5 MG.  NOTHING FURTHER NEEDED.

## 2020-03-25 ENCOUNTER — Other Ambulatory Visit: Payer: Self-pay

## 2020-03-25 ENCOUNTER — Encounter: Payer: Self-pay | Admitting: Cardiology

## 2020-03-25 ENCOUNTER — Ambulatory Visit: Payer: BC Managed Care – PPO | Admitting: Cardiology

## 2020-03-25 VITALS — BP 104/70 | HR 82 | Ht 72.0 in | Wt 217.0 lb

## 2020-03-25 DIAGNOSIS — I6529 Occlusion and stenosis of unspecified carotid artery: Secondary | ICD-10-CM | POA: Diagnosis not present

## 2020-03-25 DIAGNOSIS — R55 Syncope and collapse: Secondary | ICD-10-CM

## 2020-03-25 MED ORDER — ROSUVASTATIN CALCIUM 20 MG PO TABS: 20.0000 mg | ORAL_TABLET | Freq: Every day | ORAL | 3 refills | Status: DC

## 2020-03-25 NOTE — Progress Notes (Signed)
Cardiology Office Note:    Date:  03/25/2020   ID:  Scott Carson, DOB 12-16-1970, MRN [id number]  PCP:  Dorothyann Peng, NP  [facility] Cardiologist:  Candee Furbish, MD  [facility] Electrophysiologist:  None   Referring MD: Dorothyann Peng, NP     History of Present Illness:    Scott Carson is a 50 y.o. male here for the evaluation of syncope at the request of Sallee Provencal, NP.  In review of prior office note from 03/05/2020 he reported that he was getting out of bed at around 10 AM in the morning [place] the bathroom and then had a syncopal collapse into the bedroom door.  Does not remember much.  Woke up, wife was helping him.  No other syncopal episodes.  Still talking HCTZ 25.  About 2-3 times a week however he may get the feeling of dizziness or lightheadedness when he is walking around her normal pace.  If he sits that goes away.  Rare atypical right-sided chest pain as well.  His blood pressure was 118/90 at that prior office visit, previous to that was in the 140/100 range.  Your Holter monitor in 2019 that was benign.  Quit tib, no SOB.  When going to work feels poor. Hearts not in it.   Past Medical History:  Diagnosis Date  . Arthritis    BACK  . CVA (cerebral vascular accident) ([place]) 2020  . Eczema of hand   . Hypertension   . Low back pain   . Smoker     Past Surgical History:  Procedure Laterality Date  . RESECTION DISTAL CLAVICAL Left 08/13/2015   Procedure: RESECTION DISTAL CLAVICAL;  Surgeon: Ninetta Lights, MD;  Location: [place];  Service: Orthopedics;  Laterality: Left;  . SHOULDER ARTHROSCOPY WITH ROTATOR CUFF REPAIR AND SUBACROMIAL DECOMPRESSION Left 08/13/2015   Procedure: LEFT SHOULDER ARTHROSCOPY DEBRIDEMENT ACROMIOPLASTY, DISTAL CLAVICAL EXCISION ROTATOR CUFF REPAIR ;  Surgeon: Ninetta Lights, MD;  Location: [place];  Service: Orthopedics;  Laterality: Left;  . TONSILLECTOMY    . UPPER  GASTROINTESTINAL ENDOSCOPY     2012,2013,2015 [facility]     Current Medications: Current Meds  Medication Sig  . amLODipine (NORVASC) 10 MG tablet Take 1 tablet (10 mg total) by mouth daily.  Marland Kitchen aspirin EC 81 MG tablet Take 81 mg by mouth daily.  Marland Kitchen labetalol (NORMODYNE) 300 MG tablet TAKE 1 TABLET BY MOUTH TWICE A DAY  . Multiple Vitamins-Minerals (CENTRUM ADULTS PO) Take by mouth.  Marland Kitchen omeprazole (PRILOSEC) 20 MG capsule TAKE 1 CAPSULE BY MOUTH EVERY DAY  . rosuvastatin (CRESTOR) 20 MG tablet Take 1 tablet (20 mg total) by mouth daily.  . tamsulosin (FLOMAX) 0.4 MG CAPS capsule TAKE 1 CAPSULE BY MOUTH EVERY DAY  . [DISCONTINUED] hydrochlorothiazide (MICROZIDE) 12.5 MG capsule Take 1 capsule (12.5 mg total) by mouth daily.  . [DISCONTINUED] simvastatin (ZOCOR) 10 MG tablet TAKE 1 TABLET BY MOUTH EVERY DAY     Allergies:   Patient has no known allergies.   Social History   Socioeconomic History  . Marital status: Married    Spouse name: Kennyth Lose  . Number of children: 3  . Years of education: Not on file  . Highest education level: 12th grade  Occupational History  . Occupation: Designer, multimedia    Comment: Dance movement psychotherapist  Tobacco Use  . Smoking status: Former Smoker    Packs/day: 0.50    Types: Cigars    Start  date: 04/30/2012    Quit date: 07/01/2013    Years since quitting: 6.7  . Smokeless tobacco: Never Used  Substance and Sexual Activity  . Alcohol use: Yes    Alcohol/week: 0.0 standard drinks    Comment: occasionaly  . Drug use: Yes    Types: Marijuana    Comment: smoked 06/20/19  . Sexual activity: Not on file  Other Topics Concern  . Not on file  Social History Narrative   ** Merged History Encounter **       Married Alcohol use-no    Current Smoker Occupation: Architect     Pt quit smoking cigarettes and cigars with Chantix. Does use cannibus. Drinks 2-3 cuos of coffee and 2-3 glasses of tea a day. No regular exercise, though is active at work. He is    an  only child.      Social Determinants of Health   Financial Resource Strain: Not on file  Food Insecurity: Not on file  Transportation Needs: Not on file  Physical Activity: Not on file  Stress: Not on file  Social Connections: Not on file     Family History: The patient's family history includes Colon cancer in his paternal grandfather; Diabetes in his maternal grandmother; Glaucoma in his father; Hypertension in his mother; Stroke in his maternal grandmother. There is no history of Other, Stomach cancer, Esophageal cancer, Prostate cancer, Colon polyps, or Rectal cancer.  ROS:   Please see the history of present illness.     All other systems reviewed and are negative.  EKGs/Labs/Other Studies Reviewed:    The following studies were reviewed today:  Carotid Doppler 03/11/2020: 1. Mild right carotid bifurcation plaque resulting in less than 50% diameter stenosis. 2. No significant left carotid plaque or stenosis. 3.  Antegrade bilateral vertebral arterial flow.  EKG: 03/05/2020-sinus rhythm 59 left atrial enlargement  Recent Labs: 03/05/2020: ALT 20; BUN 12; Creatinine, Ser 1.08; Hemoglobin 14.2; Platelets 271.0; Potassium 3.7; Sodium 138  Recent Lipid Panel    Component Value Date/Time   CHOL 111 01/11/2019 1048   TRIG 34.0 01/11/2019 1048   HDL 46.40 01/11/2019 1048   CHOLHDL 2 01/11/2019 1048   VLDL 6.8 01/11/2019 1048   LDLCALC 58 01/11/2019 1048   LDLCALC 52 07/31/2017 1636     Risk Assessment/Calculations:      Physical Exam:    VS:  BP 104/70 (BP Location: Left Arm, Patient Position: Sitting, Cuff Size: Normal)   Pulse 82   Ht 6' (1.829 m)   Wt 217 lb (98.4 kg)   SpO2 96%   BMI 29.43 kg/m     Wt Readings from Last 3 Encounters:  03/25/20 217 lb (98.4 kg)  03/19/20 217 lb (98.4 kg)  03/05/20 218 lb (98.9 kg)     GEN:  Well nourished, well developed in no acute distress HEENT: Normal NECK: No JVD; No carotid bruits LYMPHATICS: No  lymphadenopathy CARDIAC: RRR, no murmurs, rubs, gallops RESPIRATORY:  Clear to auscultation without rales, wheezing or rhonchi  ABDOMEN: Soft, non-tender, non-distended MUSCULOSKELETAL:  No edema; No deformity  SKIN: Warm and dry NEUROLOGIC:  Alert and oriented x 3 PSYCHIATRIC:  Normal affect   ASSESSMENT:    1. Syncope and collapse   2. Stenosis of carotid artery, unspecified laterality    PLAN:    In order of problems listed above:  Syncope -Agree that this may be hypotension.  Today in the office it was 104/70.  He has lost a little bit of weight  he states.  We will go ahead and discontinue his hydrochlorothiazide.  Continuing with amlodipine 10 and labetalol 300 twice a day for now. -I will see him back in 1 month to see how he is feeling. -Awaiting monitor results.  He is finishing up his Zio patch monitor soon.  Currently wearing. -I will check an echocardiogram.  Carotid artery plaque -We will change his simvastatin 10 over to Crestor 20 mg a day.  I would like him to be on a high intensity statin.  Last LDL was 58.  Excellent.  Creatinine 1.08 potassium 3.7 ALT 20  Essential hypertension -As described above.  Stopping the HCTZ.    Medication Adjustments/Labs and Tests Ordered: Current medicines are reviewed at length with the patient today.  Concerns regarding medicines are outlined above.  Orders Placed This Encounter  Procedures  . ECHOCARDIOGRAM COMPLETE   Meds ordered this encounter  Medications  . rosuvastatin (CRESTOR) 20 MG tablet    Sig: Take 1 tablet (20 mg total) by mouth daily.    Dispense:  90 tablet    Refill:  3    Patient Instructions  Medication Instructions:  Please discontinue your Hydrochlorothiazide.  Stop Simvastatin and start Crestor 20 mg a day. Continue all other medications as listed.  *If you need a refill on your cardiac medications before your next appointment, please call your pharmacy*  Testing/Procedures: Your physician has  requested that you have an echocardiogram. Echocardiography is a painless test that uses sound waves to create images of your heart. It provides your doctor with information about the size and shape of your heart and how well your heart's chambers and valves are working. This procedure takes approximately one hour. There are no restrictions for this procedure.  Follow-Up: At [facility], you and your health needs are our priority.  As part of our continuing mission to provide you with exceptional heart care, we have created designated Provider Care Teams.  These Care Teams include your primary Cardiologist (physician) and Advanced Practice Providers (APPs -  Physician Assistants and Nurse Practitioners) who all work together to provide you with the care you need, when you need it.  We recommend signing up for the patient portal called "MyChart".  Sign up information is provided on this After Visit Summary.  MyChart is used to connect with patients for Virtual Visits (Telemedicine).  Patients are able to view lab/test results, encounter notes, upcoming appointments, etc.  Non-urgent messages can be sent to your provider as well.   To learn more about what you can do with MyChart, go to [email or web address].    Your next appointment:   4 week(s)  The format for your next appointment:   In Person  Provider:   Candee Furbish, MD   Thank you for choosing [facility]!!        Signed, Candee Furbish, MD  03/25/2020 2:57 PM    [place]

## 2020-03-25 NOTE — Patient Instructions (Addendum)
Medication Instructions:  Please discontinue your Hydrochlorothiazide.  Stop Simvastatin and start Crestor 20 mg a day. Continue all other medications as listed.  *If you need a refill on your cardiac medications before your next appointment, please call your pharmacy*  Testing/Procedures: Your physician has requested that you have an echocardiogram. Echocardiography is a painless test that uses sound waves to create images of your heart. It provides your doctor with information about the size and shape of your heart and how well your heart's chambers and valves are working. This procedure takes approximately one hour. There are no restrictions for this procedure.  Follow-Up: At [facility], you and your health needs are our priority.  As part of our continuing mission to provide you with exceptional heart care, we have created designated Provider Care Teams.  These Care Teams include your primary Cardiologist (physician) and Advanced Practice Providers (APPs -  Physician Assistants and Nurse Practitioners) who all work together to provide you with the care you need, when you need it.  We recommend signing up for the patient portal called "MyChart".  Sign up information is provided on this After Visit Summary.  MyChart is used to connect with patients for Virtual Visits (Telemedicine).  Patients are able to view lab/test results, encounter notes, upcoming appointments, etc.  Non-urgent messages can be sent to your provider as well.   To learn more about what you can do with MyChart, go to [email or web address].    Your next appointment:   4 week(s)  The format for your next appointment:   In Person  Provider:   Candee Furbish, MD   Thank you for choosing [facility]!!

## 2020-03-29 ENCOUNTER — Other Ambulatory Visit: Payer: Self-pay | Admitting: Adult Health

## 2020-03-29 DIAGNOSIS — K2100 Gastro-esophageal reflux disease with esophagitis, without bleeding: Secondary | ICD-10-CM

## 2020-03-31 NOTE — Telephone Encounter (Signed)
Sent to the pharmacy by e-scribe.  Pt has upcoming cpx. 

## 2020-04-16 ENCOUNTER — Ambulatory Visit (HOSPITAL_COMMUNITY): Payer: BC Managed Care – PPO | Attending: Cardiology

## 2020-04-16 ENCOUNTER — Other Ambulatory Visit: Payer: Self-pay

## 2020-04-16 DIAGNOSIS — R55 Syncope and collapse: Secondary | ICD-10-CM | POA: Diagnosis not present

## 2020-04-16 LAB — ECHOCARDIOGRAM COMPLETE
Area-P 1/2: 4.89 cm2
S' Lateral: 3 cm

## 2020-04-23 ENCOUNTER — Encounter: Payer: Self-pay | Admitting: Cardiology

## 2020-04-23 ENCOUNTER — Ambulatory Visit: Payer: BC Managed Care – PPO | Admitting: Cardiology

## 2020-04-23 ENCOUNTER — Other Ambulatory Visit: Payer: Self-pay

## 2020-04-23 VITALS — BP 120/80 | HR 86 | Ht 72.0 in | Wt 213.0 lb

## 2020-04-23 DIAGNOSIS — I10 Essential (primary) hypertension: Secondary | ICD-10-CM

## 2020-04-23 DIAGNOSIS — Z8639 Personal history of other endocrine, nutritional and metabolic disease: Secondary | ICD-10-CM

## 2020-04-23 DIAGNOSIS — R55 Syncope and collapse: Secondary | ICD-10-CM | POA: Diagnosis not present

## 2020-04-23 DIAGNOSIS — I6529 Occlusion and stenosis of unspecified carotid artery: Secondary | ICD-10-CM

## 2020-04-23 DIAGNOSIS — I77810 Thoracic aortic ectasia: Secondary | ICD-10-CM | POA: Insufficient documentation

## 2020-04-23 NOTE — Progress Notes (Signed)
Cardiology Office Note:    Date:  04/23/2020   ID:  Scott Carson, DOB 1970-06-27, MRN [id number]  PCP:  Dorothyann Peng, NP   [place]  Cardiologist:  Candee Furbish, MD  Advanced Practice Provider:  No care team member to display Electrophysiologist:  None       Referring MD: Dorothyann Peng, NP     History of Present Illness:    Scott Carson is a 49 y.o. male here for follow-up of syncope.  Seen last month.  Review of last note he reported that getting out of bed and walking to the bathroom had a syncopal episode, collapse of the bedroom floor.  Does not remember much.  He woke up and his wife was helping him.  No other episodes.  A couple times a week he would feel this episode of dizziness and lightheadedness when walking around at a normal pace.  Usually it is relieved when he is sitting down.  Currently feels better after stopping the HCTZ.  No further syncopal episodes.  Does have some occasional tightness in his left upper chest which may be related to left shoulder musculoskeletal tension.  Works in [employer]  Past Medical History:  Diagnosis Date  . Arthritis    BACK  . CVA (cerebral vascular accident) ([place]) 2020  . Eczema of hand   . Hypertension   . Low back pain   . Smoker     Past Surgical History:  Procedure Laterality Date  . RESECTION DISTAL CLAVICAL Left 08/13/2015   Procedure: RESECTION DISTAL CLAVICAL;  Surgeon: Ninetta Lights, MD;  Location: [place];  Service: Orthopedics;  Laterality: Left;  . SHOULDER ARTHROSCOPY WITH ROTATOR CUFF REPAIR AND SUBACROMIAL DECOMPRESSION Left 08/13/2015   Procedure: LEFT SHOULDER ARTHROSCOPY DEBRIDEMENT ACROMIOPLASTY, DISTAL CLAVICAL EXCISION ROTATOR CUFF REPAIR ;  Surgeon: Ninetta Lights, MD;  Location: [place];  Service: Orthopedics;  Laterality: Left;  . TONSILLECTOMY    . UPPER GASTROINTESTINAL ENDOSCOPY     2012,2013,2015 [facility]     Current  Medications: Current Meds  Medication Sig  . amLODipine (NORVASC) 10 MG tablet Take 1 tablet (10 mg total) by mouth daily.  Marland Kitchen aspirin EC 81 MG tablet Take 81 mg by mouth daily.  Marland Kitchen labetalol (NORMODYNE) 300 MG tablet TAKE 1 TABLET BY MOUTH TWICE A DAY  . Multiple Vitamins-Minerals (CENTRUM ADULTS PO) Take by mouth.  Marland Kitchen omeprazole (PRILOSEC) 20 MG capsule TAKE 1 CAPSULE BY MOUTH EVERY DAY  . rosuvastatin (CRESTOR) 20 MG tablet Take 1 tablet (20 mg total) by mouth daily.  . tamsulosin (FLOMAX) 0.4 MG CAPS capsule TAKE 1 CAPSULE BY MOUTH EVERY DAY     Allergies:   Patient has no known allergies.   Social History   Socioeconomic History  . Marital status: Married    Spouse name: Kennyth Lose  . Number of children: 3  . Years of education: Not on file  . Highest education level: 12th grade  Occupational History  . Occupation: Designer, multimedia    Comment: Dance movement psychotherapist  Tobacco Use  . Smoking status: Former Smoker    Packs/day: 0.50    Types: Cigars    Start date: 04/30/2012    Quit date: 07/01/2013    Years since quitting: 6.8  . Smokeless tobacco: Never Used  Substance and Sexual Activity  . Alcohol use: Yes    Alcohol/week: 0.0 standard drinks    Comment: occasionaly  . Drug use: Yes  Types: Marijuana    Comment: smoked 06/20/19  . Sexual activity: Not on file  Other Topics Concern  . Not on file  Social History Narrative   ** Merged History Encounter **       Married Alcohol use-no    Current Smoker Occupation: Architect     Pt quit smoking cigarettes and cigars with Chantix. Does use cannibus. Drinks 2-3 cuos of coffee and 2-3 glasses of tea a day. No regular exercise, though is active at work. He is    an only child.      Social Determinants of Health   Financial Resource Strain: Not on file  Food Insecurity: Not on file  Transportation Needs: Not on file  Physical Activity: Not on file  Stress: Not on file  Social Connections: Not on file     Family  History: The patient's family history includes Colon cancer in his paternal grandfather; Diabetes in his maternal grandmother; Glaucoma in his father; Hypertension in his mother; Stroke in his maternal grandmother. There is no history of Other, Stomach cancer, Esophageal cancer, Prostate cancer, Colon polyps, or Rectal cancer.  ROS:   Please see the history of present illness.     All other systems reviewed and are negative.  EKGs/Labs/Other Studies Reviewed:    The following studies were reviewed today:  Carotid Doppler 03/11/2020-mild right bifurcation plaque less than 50%.  Echocardiogram 04/16/2020:  1. Left ventricular ejection fraction, by estimation, is 55 to 60%. The  left ventricle has normal function. The left ventricle has no regional  wall motion abnormalities. There is mild left ventricular hypertrophy.  Left ventricular diastolic parameters  are consistent with Grade I diastolic dysfunction (impaired relaxation).  2. Right ventricular systolic function is normal. The right ventricular  size is normal. Tricuspid regurgitation signal is inadequate for assessing  PA pressure.  3. The mitral valve is normal in structure. Trivial mitral valve  regurgitation. No evidence of mitral stenosis.  4. The aortic valve is tricuspid. Aortic valve regurgitation is trivial.  No aortic stenosis is present.  5. Aortic dilatation noted. There is mild to moderate dilatation of the  aortic root, measuring 41 mm.  6. The inferior vena cava is normal in size with greater than 50%  respiratory variability, suggesting right atrial pressure of 3 mmHg.   Comparison(s): 02/17/17 EF 55-60%.   Event monitor 03/31/2020:   Sinus rhythm with average heart rate of 84 bpm. Normal heart rate variability.  No pauses, no atrial fibrillation  Rare PVCs, rare PACs.   Reassuring monitor.   Recent Labs: 03/05/2020: ALT 20; BUN 12; Creatinine, Ser 1.08; Hemoglobin 14.2; Platelets 271.0; Potassium  3.7; Sodium 138  Recent Lipid Panel    Component Value Date/Time   CHOL 111 01/11/2019 1048   TRIG 34.0 01/11/2019 1048   HDL 46.40 01/11/2019 1048   CHOLHDL 2 01/11/2019 1048   VLDL 6.8 01/11/2019 1048   LDLCALC 58 01/11/2019 1048   LDLCALC 52 07/31/2017 1636     Risk Assessment/Calculations:      Physical Exam:    VS:  BP 120/80   Pulse 86   Ht 6' (1.829 m)   Wt 213 lb (96.6 kg)   SpO2 99%   BMI 28.89 kg/m     Wt Readings from Last 3 Encounters:  04/23/20 213 lb (96.6 kg)  03/25/20 217 lb (98.4 kg)  03/19/20 217 lb (98.4 kg)     GEN:  Well nourished, well developed in no acute distress HEENT: Normal  NECK: No JVD; No carotid bruits LYMPHATICS: No lymphadenopathy CARDIAC: RRR, no murmurs, rubs, gallops RESPIRATORY:  Clear to auscultation without rales, wheezing or rhonchi  ABDOMEN: Soft, non-tender, non-distended MUSCULOSKELETAL:  No edema; No deformity  SKIN: Warm and dry NEUROLOGIC:  Alert and oriented x 3 PSYCHIATRIC:  Normal affect   ASSESSMENT:    1. Syncope and collapse   2. Stenosis of carotid artery, unspecified laterality   3. Essential hypertension   4. Dilated aortic root ([place])   5. Hx of hyperlipidemia    PLAN:    In order of problems listed above:  Syncope -Event monitor reassuring as above -Echocardiogram also reassuring. -At prior visit we discontinued his HCTZ. -Thought was that hypotension could be playing a role in this.  Currently feels better.  Blood pressure 120/80 now.  Continue with other medications.  Carotid artery plaque -Because of moderate plaque, we change his simvastatin 10 over to Crestor 20 mg a day.  Prior LDL had been 58 however plaque was present.  We will check lipid panel and 2 months with ALT.  Essential hypertension -Medications reviewed as above.  Recently stopped HCTZ because of syncopal episodes.  No other changes.  Dilated ascending aorta -41 mm.  We will repeat echocardiogram in 1 year for  stability.  Esophageal stricture -Occasionally will need esophageal dilatation.  Currently stable.   Medication Adjustments/Labs and Tests Ordered: Current medicines are reviewed at length with the patient today.  Concerns regarding medicines are outlined above.  Orders Placed This Encounter  Procedures  . Lipid panel  . ALT  . ECHOCARDIOGRAM COMPLETE   No orders of the defined types were placed in this encounter.   Patient Instructions  Medication Instructions:  Your physician recommends that you continue on your current medications as directed. Please refer to the Current Medication list given to you today. *If you need a refill on your cardiac medications before your next appointment, please call your pharmacy*   Lab Work: none If you have labs (blood work) drawn today and your tests are completely normal, you will receive your results only by: Marland Kitchen MyChart Message (if you have MyChart) OR . A paper copy in the mail If you have any lab test that is abnormal or we need to change your treatment, we will call you to review the results.   Testing/Procedures: Your physician has requested that you have an echocardiogram. Echocardiography is a painless test that uses sound waves to create images of your heart. It provides your doctor with information about the size and shape of your heart and how well your heart's chambers and valves are working. This procedure takes approximately one hour. There are no restrictions for this procedure.  LIPID/ALT blood work in 2 months   Follow-Up: At [facility], you and your health needs are our priority.  As part of our continuing mission to provide you with exceptional heart care, we have created designated Provider Care Teams.  These Care Teams include your primary Cardiologist (physician) and Advanced Practice Providers (APPs -  Physician Assistants and Nurse Practitioners) who all work together to provide you with the care you need, when you  need it.  We recommend signing up for the patient portal called "MyChart".  Sign up information is provided on this After Visit Summary.  MyChart is used to connect with patients for Virtual Visits (Telemedicine).  Patients are able to view lab/test results, encounter notes, upcoming appointments, etc.  Non-urgent messages can be sent to your provider  as well.   To learn more about what you can do with MyChart, go to [email or web address].    Your next appointment:   12 month(s)  The format for your next appointment:   In Person  Provider:   You may see Candee Furbish, MD or one of the following Advanced Practice Providers on your designated Care Team:    Kathyrn Drown, NP        Signed, Candee Furbish, MD  04/23/2020 2:57 PM    [place]

## 2020-04-23 NOTE — Patient Instructions (Signed)
Medication Instructions:  Your physician recommends that you continue on your current medications as directed. Please refer to the Current Medication list given to you today. *If you need a refill on your cardiac medications before your next appointment, please call your pharmacy*   Lab Work: none If you have labs (blood work) drawn today and your tests are completely normal, you will receive your results only by: Marland Kitchen MyChart Message (if you have MyChart) OR . A paper copy in the mail If you have any lab test that is abnormal or we need to change your treatment, we will call you to review the results.   Testing/Procedures: Your physician has requested that you have an echocardiogram. Echocardiography is a painless test that uses sound waves to create images of your heart. It provides your doctor with information about the size and shape of your heart and how well your heart's chambers and valves are working. This procedure takes approximately one hour. There are no restrictions for this procedure.  LIPID/ALT blood work in 2 months   Follow-Up: At [facility], you and your health needs are our priority.  As part of our continuing mission to provide you with exceptional heart care, we have created designated Provider Care Teams.  These Care Teams include your primary Cardiologist (physician) and Advanced Practice Providers (APPs -  Physician Assistants and Nurse Practitioners) who all work together to provide you with the care you need, when you need it.  We recommend signing up for the patient portal called "MyChart".  Sign up information is provided on this After Visit Summary.  MyChart is used to connect with patients for Virtual Visits (Telemedicine).  Patients are able to view lab/test results, encounter notes, upcoming appointments, etc.  Non-urgent messages can be sent to your provider as well.   To learn more about what you can do with MyChart, go to [email or web address].    Your  next appointment:   12 month(s)  The format for your next appointment:   In Person  Provider:   You may see Candee Furbish, MD or one of the following Advanced Practice Providers on your designated Care Team:    Kathyrn Drown, NP

## 2020-05-20 ENCOUNTER — Other Ambulatory Visit (HOSPITAL_COMMUNITY): Payer: BC Managed Care – PPO

## 2020-05-20 DIAGNOSIS — H40013 Open angle with borderline findings, low risk, bilateral: Secondary | ICD-10-CM | POA: Diagnosis not present

## 2020-05-20 DIAGNOSIS — H52203 Unspecified astigmatism, bilateral: Secondary | ICD-10-CM | POA: Diagnosis not present

## 2020-05-20 DIAGNOSIS — H524 Presbyopia: Secondary | ICD-10-CM | POA: Diagnosis not present

## 2020-05-20 DIAGNOSIS — H5213 Myopia, bilateral: Secondary | ICD-10-CM | POA: Diagnosis not present

## 2020-06-17 ENCOUNTER — Encounter: Payer: BC Managed Care – PPO | Admitting: Adult Health

## 2020-06-22 ENCOUNTER — Other Ambulatory Visit: Payer: Self-pay | Admitting: Adult Health

## 2020-06-22 ENCOUNTER — Other Ambulatory Visit: Payer: BC Managed Care – PPO

## 2020-06-22 DIAGNOSIS — R351 Nocturia: Secondary | ICD-10-CM

## 2020-06-22 DIAGNOSIS — N401 Enlarged prostate with lower urinary tract symptoms: Secondary | ICD-10-CM

## 2020-06-22 DIAGNOSIS — I10 Essential (primary) hypertension: Secondary | ICD-10-CM

## 2020-07-05 ENCOUNTER — Other Ambulatory Visit: Payer: Self-pay | Admitting: Adult Health

## 2020-07-05 DIAGNOSIS — K2100 Gastro-esophageal reflux disease with esophagitis, without bleeding: Secondary | ICD-10-CM

## 2020-07-19 ENCOUNTER — Other Ambulatory Visit: Payer: Self-pay | Admitting: Adult Health

## 2020-07-23 ENCOUNTER — Other Ambulatory Visit: Payer: Self-pay

## 2020-07-24 ENCOUNTER — Ambulatory Visit (INDEPENDENT_AMBULATORY_CARE_PROVIDER_SITE_OTHER): Payer: BC Managed Care – PPO | Admitting: Adult Health

## 2020-07-24 ENCOUNTER — Encounter: Payer: Self-pay | Admitting: Adult Health

## 2020-07-24 VITALS — BP 122/90 | HR 77 | Temp 98.4°F | Ht 72.25 in | Wt 211.0 lb

## 2020-07-24 DIAGNOSIS — Z0000 Encounter for general adult medical examination without abnormal findings: Secondary | ICD-10-CM

## 2020-07-24 DIAGNOSIS — E782 Mixed hyperlipidemia: Secondary | ICD-10-CM

## 2020-07-24 DIAGNOSIS — I10 Essential (primary) hypertension: Secondary | ICD-10-CM | POA: Diagnosis not present

## 2020-07-24 DIAGNOSIS — Z125 Encounter for screening for malignant neoplasm of prostate: Secondary | ICD-10-CM

## 2020-07-24 DIAGNOSIS — Z1159 Encounter for screening for other viral diseases: Secondary | ICD-10-CM | POA: Diagnosis not present

## 2020-07-24 LAB — CBC WITH DIFFERENTIAL/PLATELET
Basophils Absolute: 0 10*3/uL (ref 0.0–0.1)
Basophils Relative: 0.2 % (ref 0.0–3.0)
Eosinophils Absolute: 0 10*3/uL (ref 0.0–0.7)
Eosinophils Relative: 0.8 % (ref 0.0–5.0)
HCT: 41.8 % (ref 39.0–52.0)
Hemoglobin: 14 g/dL (ref 13.0–17.0)
Lymphocytes Relative: 35.1 % (ref 12.0–46.0)
Lymphs Abs: 2 10*3/uL (ref 0.7–4.0)
MCHC: 33.5 g/dL (ref 30.0–36.0)
MCV: 89.7 fl (ref 78.0–100.0)
Monocytes Absolute: 0.4 10*3/uL (ref 0.1–1.0)
Monocytes Relative: 7.6 % (ref 3.0–12.0)
Neutro Abs: 3.2 10*3/uL (ref 1.4–7.7)
Neutrophils Relative %: 56.3 % (ref 43.0–77.0)
Platelets: 256 10*3/uL (ref 150.0–400.0)
RBC: 4.66 Mil/uL (ref 4.22–5.81)
RDW: 14.1 % (ref 11.5–15.5)
WBC: 5.7 10*3/uL (ref 4.0–10.5)

## 2020-07-24 LAB — TSH: TSH: 1.02 u[IU]/mL (ref 0.35–4.50)

## 2020-07-24 LAB — PSA: PSA: 2.15 ng/mL (ref 0.10–4.00)

## 2020-07-24 MED ORDER — FINASTERIDE 5 MG PO TABS: 5.0000 mg | ORAL_TABLET | Freq: Every day | ORAL | 3 refills | Status: DC

## 2020-07-24 NOTE — Progress Notes (Signed)
Subjective:    Patient ID: Scott Carson, male    DOB: 28-Mar-1970, 50 y.o.   MRN: [id number]  HPI Patient presents for yearly preventative medicine examination. Pleasant 50 year old male who  has a past medical history of Arthritis, CVA (cerebral vascular accident) ([place]) (2020), Eczema of hand, Hypertension, Low back pain, and Smoker.   Hypertension - takes Norvasc 10 mg daily and labetalol 300 mg BID.  He denies dizziness, lightheadedness, chest pain, or shortness of breath.  At this year he was taking HCTZ but had a syncopal episode and was thought that this was due to orthostatic hypotension.  HCT was discontinued and symptoms have resolved BP Readings from Last 3 Encounters:  07/24/20 122/90  04/23/20 120/80  03/25/20 104/70   Hyperlipidemia - takes Crestor 20 mg daily. Denies myalgia or fatigue  Lab Results  Component Value Date   CHOL 111 01/11/2019   HDL 46.40 01/11/2019   LDLCALC 58 01/11/2019   TRIG 34.0 01/11/2019   CHOLHDL 2 01/11/2019   GERD - takes Prilosec 20 mg daily.   BPH - stopped Flomax as he did not like the sexual side effects. Is interested in trying something else. Symptoms include incomplete bladder emptying, frequency, and nocturia.    All immunizations and health maintenance protocols were reviewed with the patient and needed orders were placed.  Appropriate screening laboratory values were ordered for the patient including screening of hyperlipidemia, renal function and hepatic function. If indicated by BPH, a PSA was ordered.  Medication reconciliation,  past medical history, social history, problem list and allergies were reviewed in detail with the patient  Goals were established with regard to weight loss, exercise, and  diet in compliance with medications  He is up-to-date on routine colon cancer screening  Review of Systems  Constitutional: Negative.   HENT: Negative.   Eyes: Negative.   Respiratory: Negative.   Cardiovascular: Negative.    Gastrointestinal: Negative.   Endocrine: Negative.   Genitourinary: Positive for difficulty urinating and frequency.  Musculoskeletal: Negative.   Skin: Negative.   Allergic/Immunologic: Negative.   Neurological: Negative.   Hematological: Negative.   Psychiatric/Behavioral: Negative.   All other systems reviewed and are negative.  Past Medical History:  Diagnosis Date  . Arthritis    BACK  . CVA (cerebral vascular accident) ([place]) 2020  . Eczema of hand   . Hypertension   . Low back pain   . Smoker     Social History   Socioeconomic History  . Marital status: Married    Spouse name: Kennyth Lose  . Number of children: 3  . Years of education: Not on file  . Highest education level: 12th grade  Occupational History  . Occupation: Designer, multimedia    Comment: Dance movement psychotherapist  Tobacco Use  . Smoking status: Former Smoker    Packs/day: 0.50    Types: Cigars    Start date: 04/30/2012    Quit date: 07/01/2013    Years since quitting: 7.0  . Smokeless tobacco: Never Used  Substance and Sexual Activity  . Alcohol use: Yes    Alcohol/week: 0.0 standard drinks    Comment: occasionaly  . Drug use: Yes    Types: Marijuana    Comment: smoked 06/20/19  . Sexual activity: Not on file  Other Topics Concern  . Not on file  Social History Narrative   ** Merged History Encounter **       Married Alcohol use-no    Current Smoker Occupation:  construction     Pt quit smoking cigarettes and cigars with Chantix. Does use cannibus. Drinks 2-3 cuos of coffee and 2-3 glasses of tea a day. No regular exercise, though is active at work. He is    an only child.      Social Determinants of Health   Financial Resource Strain: Not on file  Food Insecurity: Not on file  Transportation Needs: Not on file  Physical Activity: Not on file  Stress: Not on file  Social Connections: Not on file  Intimate Partner Violence: Not on file    Past Surgical History:  Procedure Laterality Date  .  RESECTION DISTAL CLAVICAL Left 08/13/2015   Procedure: RESECTION DISTAL CLAVICAL;  Surgeon: Ninetta Lights, MD;  Location: [facility];  Service: Orthopedics;  Laterality: Left;  . SHOULDER ARTHROSCOPY WITH ROTATOR CUFF REPAIR AND SUBACROMIAL DECOMPRESSION Left 08/13/2015   Procedure: LEFT SHOULDER ARTHROSCOPY DEBRIDEMENT ACROMIOPLASTY, DISTAL CLAVICAL EXCISION ROTATOR CUFF REPAIR ;  Surgeon: Ninetta Lights, MD;  Location: [facility];  Service: Orthopedics;  Laterality: Left;  . TONSILLECTOMY    . UPPER GASTROINTESTINAL ENDOSCOPY     2012,2013,2015 [facility]     Family History  Problem Relation Age of Onset  . Hypertension Mother   . Glaucoma Father   . Diabetes Maternal Grandmother   . Stroke Maternal Grandmother   . Colon cancer Paternal Grandfather   . Other Neg Hx        no family history of colon cancer  . Stomach cancer Neg Hx   . Esophageal cancer Neg Hx   . Prostate cancer Neg Hx   . Colon polyps Neg Hx   . Rectal cancer Neg Hx     No Known Allergies  Current Outpatient Medications on File Prior to Visit  Medication Sig Dispense Refill  . amLODipine (NORVASC) 10 MG tablet TAKE 1 TABLET BY MOUTH EVERY DAY 90 tablet 1  . aspirin EC 81 MG tablet Take 81 mg by mouth daily.    . hydrochlorothiazide (MICROZIDE) 12.5 MG capsule Take 12.5 mg by mouth daily.    Marland Kitchen labetalol (NORMODYNE) 300 MG tablet TAKE 1 TABLET BY MOUTH TWICE A DAY 180 tablet 1  . Multiple Vitamins-Minerals (CENTRUM ADULTS PO) Take by mouth.    Marland Kitchen omeprazole (PRILOSEC) 20 MG capsule TAKE 1 CAPSULE BY MOUTH EVERY DAY 90 capsule 0  . rosuvastatin (CRESTOR) 20 MG tablet Take 1 tablet (20 mg total) by mouth daily. 90 tablet 3  . tamsulosin (FLOMAX) 0.4 MG CAPS capsule TAKE 1 CAPSULE BY MOUTH EVERY DAY 90 capsule 0   No current facility-administered medications on file prior to visit.    BP 122/90   Pulse 77   Temp 98.4 F (36.9 C) (Oral)   Ht 6' 0.25" (1.835 m)   Wt 211 lb (95.7 kg)    SpO2 98%   BMI 28.42 kg/m       Objective:   Physical Exam Vitals and nursing note reviewed.  Constitutional:      General: He is not in acute distress.    Appearance: Normal appearance. He is well-developed and normal weight.  HENT:     Head: Normocephalic and atraumatic.     Right Ear: Tympanic membrane, ear canal and external ear normal. There is no impacted cerumen.     Left Ear: Tympanic membrane, ear canal and external ear normal. There is no impacted cerumen.     Nose: Nose normal. No congestion or rhinorrhea.  Mouth/Throat:     Mouth: Mucous membranes are moist.     Pharynx: Oropharynx is clear. No oropharyngeal exudate or posterior oropharyngeal erythema.  Eyes:     General:        Right eye: No discharge.        Left eye: No discharge.     Extraocular Movements: Extraocular movements intact.     Conjunctiva/sclera: Conjunctivae normal.     Pupils: Pupils are equal, round, and reactive to light.  Neck:     Vascular: No carotid bruit.     Trachea: No tracheal deviation.  Cardiovascular:     Rate and Rhythm: Normal rate and regular rhythm.     Pulses: Normal pulses.     Heart sounds: Normal heart sounds. No murmur heard. No friction rub. No gallop.   Pulmonary:     Effort: Pulmonary effort is normal. No respiratory distress.     Breath sounds: Normal breath sounds. No stridor. No wheezing, rhonchi or rales.  Chest:     Chest wall: No tenderness.  Abdominal:     General: Bowel sounds are normal. There is no distension.     Palpations: Abdomen is soft. There is no mass.     Tenderness: There is no abdominal tenderness. There is no right CVA tenderness, left CVA tenderness, guarding or rebound.     Hernia: No hernia is present.  Musculoskeletal:        General: No swelling, tenderness, deformity or signs of injury. Normal range of motion.     Right lower leg: No edema.     Left lower leg: No edema.  Lymphadenopathy:     Cervical: No cervical adenopathy.   Skin:    General: Skin is warm and dry.     Capillary Refill: Capillary refill takes less than 2 seconds.     Coloration: Skin is not jaundiced or pale.     Findings: No bruising, erythema, lesion or rash.  Neurological:     General: No focal deficit present.     Mental Status: He is alert and oriented to person, place, and time.     Cranial Nerves: No cranial nerve deficit.     Sensory: No sensory deficit.     Motor: No weakness.     Coordination: Coordination normal.     Gait: Gait normal.     Deep Tendon Reflexes: Reflexes normal.  Psychiatric:        Mood and Affect: Mood normal.        Behavior: Behavior normal.        Thought Content: Thought content normal.        Judgment: Judgment normal.       Assessment & Plan:  1. Routine general medical examination at a health care facility - Encouraged lifestyle modifications  - Follow up in one year or sooner if needed - CBC with Differential/Platelet; Future - Comprehensive metabolic panel; Future - Lipid panel; Future - TSH; Future - Hep C Antibody; Future  2. Mixed hyperlipidemia - Consider increase in statin  - CBC with Differential/Platelet; Future - Comprehensive metabolic panel; Future - Lipid panel; Future - TSH; Future - Hep C Antibody; Future  3. Essential hypertension - Controlled. No change in medications  - CBC with Differential/Platelet; Future - Comprehensive metabolic panel; Future - Lipid panel; Future - TSH; Future - Hep C Antibody; Future  4. BPH  - trial on Proscar 5 mg  - PSA; Future  5. Need for hepatitis C screening test  -  Hep C Antibody; Future  [employer]

## 2020-07-24 NOTE — Addendum Note (Signed)
Addended by: Amanda Cockayne on: 07/24/2020 09:20 AM   Modules accepted: Orders

## 2020-07-24 NOTE — Patient Instructions (Signed)
It was great seeing you today   We will follow up with you regarding your labs   I have sent in a medication called Proscar to help with your urinary issues. It may take a month for this medication to really work   I will see you back in one year or sooner if needed

## 2020-07-27 LAB — COMPREHENSIVE METABOLIC PANEL
ALT: 14 U/L (ref 0–53)
AST: 17 U/L (ref 0–37)
Albumin: 4.2 g/dL (ref 3.5–5.2)
Alkaline Phosphatase: 54 U/L (ref 39–117)
BUN: 11 mg/dL (ref 6–23)
CO2: 25 mEq/L (ref 19–32)
Calcium: 9.4 mg/dL (ref 8.4–10.5)
Chloride: 105 mEq/L (ref 96–112)
Creatinine, Ser: 1.02 mg/dL (ref 0.40–1.50)
GFR: 85.26 mL/min (ref >60.00)
Glucose, Bld: 102 mg/dL — ABNORMAL HIGH (ref 70–99)
Potassium: 4.1 mEq/L (ref 3.5–5.1)
Sodium: 139 mEq/L (ref 135–145)
Total Bilirubin: 0.6 mg/dL (ref 0.2–1.2)
Total Protein: 6.7 g/dL (ref 6.0–8.3)

## 2020-07-27 LAB — LIPID PANEL
Cholesterol: 97 mg/dL (ref 0–200)
HDL: 49.5 mg/dL (ref >39.00)
LDL Cholesterol: 42 mg/dL (ref 0–99)
NonHDL: 47.88
Total CHOL/HDL Ratio: 2
Triglycerides: 28 mg/dL (ref 0.0–149.0)
VLDL: 5.6 mg/dL (ref 0.0–40.0)

## 2020-07-27 LAB — HEPATITIS C ANTIBODY
Hepatitis C Ab: NONREACTIVE
SIGNAL TO CUT-OFF: 0.02 (ref <1.00)

## 2020-10-05 ENCOUNTER — Other Ambulatory Visit: Payer: Self-pay | Admitting: Adult Health

## 2020-10-05 DIAGNOSIS — N401 Enlarged prostate with lower urinary tract symptoms: Secondary | ICD-10-CM

## 2020-10-05 DIAGNOSIS — R351 Nocturia: Secondary | ICD-10-CM

## 2020-10-05 DIAGNOSIS — K2100 Gastro-esophageal reflux disease with esophagitis, without bleeding: Secondary | ICD-10-CM

## 2021-01-19 ENCOUNTER — Other Ambulatory Visit: Payer: Self-pay | Admitting: Adult Health

## 2021-01-19 DIAGNOSIS — I10 Essential (primary) hypertension: Secondary | ICD-10-CM

## 2021-01-23 ENCOUNTER — Other Ambulatory Visit: Payer: Self-pay | Admitting: Adult Health

## 2021-01-23 DIAGNOSIS — K2100 Gastro-esophageal reflux disease with esophagitis, without bleeding: Secondary | ICD-10-CM

## 2021-02-16 ENCOUNTER — Telehealth: Payer: Self-pay | Admitting: Adult Health

## 2021-02-16 NOTE — Telephone Encounter (Signed)
Patient called to clarify that he is unable to get into mychart and a good number to reach him at is [phone].  FYI

## 2021-02-17 NOTE — Telephone Encounter (Signed)
Pt notified of PCP response & verb understanding. 

## 2021-02-17 NOTE — Telephone Encounter (Signed)
Pt states he wanted to know when he needed another appt. Last CPE 07/23/20 with recommendation to return in 7yr or sooner if needed. Pt verb understanding.  Pt states that Proscar 5mg  has been helpful, but he is still experiencing urinary frequency (urinating normal amts). He is fine with this if this is normal. Advised pt that I will consult PCP & call him back if any changes are needed. Pt agrees to plan.

## 2021-02-26 ENCOUNTER — Encounter: Payer: Self-pay | Admitting: Gastroenterology

## 2021-02-26 ENCOUNTER — Ambulatory Visit: Payer: BC Managed Care – PPO | Admitting: Gastroenterology

## 2021-02-26 VITALS — BP 134/88 | HR 96 | Ht 71.0 in | Wt 207.5 lb

## 2021-02-26 DIAGNOSIS — K219 Gastro-esophageal reflux disease without esophagitis: Secondary | ICD-10-CM

## 2021-02-26 DIAGNOSIS — R1310 Dysphagia, unspecified: Secondary | ICD-10-CM

## 2021-02-26 DIAGNOSIS — K222 Esophageal obstruction: Secondary | ICD-10-CM

## 2021-02-26 NOTE — Progress Notes (Signed)
02/26/2021 Scott Carson [id number] [birth year]   HISTORY OF PRESENT ILLNESS: This is a pleasant 51 year old male is a patient of Dr. Ardis Hughs.  He has history of GERD, esophagitis, Schatzki's ring.  He had an EGD in 2012, 2013, and 2015.  He has really not had any issues since that time.  Has been maintained on omeprazole 20 mg daily.  He says that several months ago he had some issues with getting it from the pharmacy and he was doing well so he discontinued it.  He says for about the past month to month and a half he has been having intermittent issues with swallowing again.  He is having food get stuck intermittently.  He says eating [place] on Thanksgiving caused him to have an issue.  Looks like his PCP did send in a new prescription for his PPI last month, but he says that he has not gotten it or been made aware of it by the pharmacy.  No nausea or vomiting.  No abdominal pain.  No NSAID use.   Past Medical History:  Diagnosis Date   Arthritis    BACK   CVA (cerebral vascular accident) ([place]) 2020   Eczema of hand    Hypertension    Low back pain    Smoker    Past Surgical History:  Procedure Laterality Date   RESECTION DISTAL CLAVICAL Left 08/13/2015   Procedure: RESECTION DISTAL CLAVICAL;  Surgeon: Ninetta Lights, MD;  Location: [facility];  Service: Orthopedics;  Laterality: Left;   SHOULDER ARTHROSCOPY WITH ROTATOR CUFF REPAIR AND SUBACROMIAL DECOMPRESSION Left 08/13/2015   Procedure: LEFT SHOULDER ARTHROSCOPY DEBRIDEMENT ACROMIOPLASTY, DISTAL CLAVICAL EXCISION ROTATOR CUFF REPAIR ;  Surgeon: Ninetta Lights, MD;  Location: [facility];  Service: Orthopedics;  Laterality: Left;   TONSILLECTOMY     UPPER GASTROINTESTINAL ENDOSCOPY     2012,2013,2015 Scott Carson     reports that he quit smoking about 7 years ago. His smoking use included cigars. He started smoking about 8 years ago. He smoked an average of .5 packs per day. He has never used smokeless  tobacco. He reports current alcohol use. He reports current drug use. Drug: Marijuana. family history includes Colon cancer in his paternal grandfather; Diabetes in his maternal grandmother; Glaucoma in his father; Hypertension in his mother; Stroke in his maternal grandmother. No Known Allergies    Outpatient Encounter Medications as of 02/26/2021  Medication Sig   amLODipine (NORVASC) 10 MG tablet TAKE 1 TABLET BY MOUTH EVERY DAY   aspirin EC 81 MG tablet Take 81 mg by mouth daily.   finasteride (PROSCAR) 5 MG tablet Take 1 tablet (5 mg total) by mouth daily.   hydrochlorothiazide (MICROZIDE) 12.5 MG capsule Take 12.5 mg by mouth daily.   labetalol (NORMODYNE) 300 MG tablet TAKE 1 TABLET BY MOUTH TWICE A DAY   Multiple Vitamins-Minerals (CENTRUM ADULTS PO) Take by mouth.   omeprazole (PRILOSEC) 20 MG capsule TAKE 1 CAPSULE BY MOUTH EVERY DAY   rosuvastatin (CRESTOR) 20 MG tablet Take 1 tablet (20 mg total) by mouth daily.   [DISCONTINUED] tamsulosin (FLOMAX) 0.4 MG CAPS capsule TAKE 1 CAPSULE BY MOUTH EVERY DAY   No facility-administered encounter medications on file as of 02/26/2021.     REVIEW OF SYSTEMS  : All other systems reviewed and negative except where noted in the History of Present Illness.   PHYSICAL EXAM: BP 134/88 (BP Location: Left Arm, Patient Position: Sitting, Cuff Size: Normal)  Pulse 96    Ht 5\' 11"  (1.803 m) Comment: height measured without shoes   Wt 207 lb 8 oz (94.1 kg)    BMI 28.94 kg/m  General: Well developed AA male in no acute distress Head: Normocephalic and atraumatic Eyes:  Sclerae anicteric, conjunctiva pink. Ears: Normal auditory acuity Lungs: Clear throughout to auscultation; no W/R/R. Heart: Regular rate and rhythm; no M/R/G. Abdomen: Soft, non-distended.  BS present.  Non-tender. Musculoskeletal: Symmetrical with no gross deformities  Skin: No lesions on visible extremities Extremities: No edema  Neurological: Alert oriented x 4, grossly  non-focal Psychological:  Alert and cooperative. Normal mood and affect  ASSESSMENT AND PLAN: *GERD, dysphagia, history of Schatzki's ring: Last EGD with dilation was in 2015.  Was doing really well on omeprazole 20 mg daily, but then had issues getting it from his pharmacy so he discontinued it.  Then over the past month to month and a half he started having some dysphagia intermittently again.  This could all just be from reflux and esophagitis.  We are going to restart his omeprazole 20 mg daily.  Looks like a new prescription was just sent in by his PCP.  We will also schedule EGD with possible dilation with Dr. Ardis Hughs a few weeks out, if he is not improved then he can certainly proceed with that, but if he feels much better by restarting his PPI then he could certainly cancel the procedure.  The risks, benefits, and alternatives to EGD with dilation were discussed with the patient and he consents to proceed.     CC:  Dorothyann Peng, NP

## 2021-02-26 NOTE — Patient Instructions (Signed)
You have been scheduled for an endoscopy. Please follow written instructions given to you at your visit today. If you use inhalers (even only as needed), please bring them with you on the day of your procedure.  If you are age 52 or older, your body mass index should be between 23-30. Your Body mass index is 28.94 kg/m. If this is out of the aforementioned range listed, please consider follow up with your Primary Care Provider.  If you are age 60 or younger, your body mass index should be between 19-25. Your Body mass index is 28.94 kg/m. If this is out of the aformentioned range listed, please consider follow up with your Primary Care Provider.   ________________________________________________________  The [facility] providers would like to encourage you to use [facility] to communicate with providers for non-urgent requests or questions.  Due to long hold times on the telephone, sending your provider a message by [facility] may be a faster and more efficient way to get a response.  Please allow 48 business hours for a response.  Please remember that this is for non-urgent requests.  _______________________________________________________

## 2021-02-28 NOTE — Progress Notes (Signed)
I agree with the above note, plan 

## 2021-03-25 ENCOUNTER — Encounter: Payer: Self-pay | Admitting: Certified Registered Nurse Anesthetist

## 2021-03-26 ENCOUNTER — Ambulatory Visit (AMBULATORY_SURGERY_CENTER): Payer: BC Managed Care – PPO | Admitting: Gastroenterology

## 2021-03-26 ENCOUNTER — Other Ambulatory Visit: Payer: Self-pay

## 2021-03-26 ENCOUNTER — Encounter: Payer: Self-pay | Admitting: Gastroenterology

## 2021-03-26 VITALS — BP 115/85 | HR 69 | Temp 96.8°F | Resp 13 | Ht 71.0 in | Wt 207.0 lb

## 2021-03-26 DIAGNOSIS — K2970 Gastritis, unspecified, without bleeding: Secondary | ICD-10-CM

## 2021-03-26 DIAGNOSIS — K219 Gastro-esophageal reflux disease without esophagitis: Secondary | ICD-10-CM

## 2021-03-26 DIAGNOSIS — R1310 Dysphagia, unspecified: Secondary | ICD-10-CM

## 2021-03-26 DIAGNOSIS — K222 Esophageal obstruction: Secondary | ICD-10-CM

## 2021-03-26 DIAGNOSIS — K2100 Gastro-esophageal reflux disease with esophagitis, without bleeding: Secondary | ICD-10-CM

## 2021-03-26 DIAGNOSIS — K2950 Unspecified chronic gastritis without bleeding: Secondary | ICD-10-CM | POA: Diagnosis not present

## 2021-03-26 MED ORDER — OMEPRAZOLE 40 MG PO CPDR: 40.0000 mg | DELAYED_RELEASE_CAPSULE | Freq: Every day | ORAL | 11 refills | Status: DC

## 2021-03-26 MED ORDER — SODIUM CHLORIDE 0.9 % IV SOLN: 500.0000 mL | Freq: Once | INTRAVENOUS | Status: DC

## 2021-03-26 NOTE — Progress Notes (Signed)
Report given to PACU, vss 

## 2021-03-26 NOTE — Op Note (Signed)
[facility] Patient Name: Scott Carson Procedure Date: 03/26/2021 9:21 AM MRN: [id number] Endoscopist: Milus Banister , MD Age: 51 Referring MD:  Date of Birth: 1970-03-09 Gender: Male Account #: [id number] Procedure:                Upper GI endoscopy Indications:              Dysphagia, GERD. previous EGDs with dilation, last                            was 7 years ago Medicines:                Monitored Anesthesia Care Procedure:                Pre-Anesthesia Assessment:                           - Prior to the procedure, a History and Physical                            was performed, and patient medications and                            allergies were reviewed. The patient's tolerance of                            previous anesthesia was also reviewed. The risks                            and benefits of the procedure and the sedation                            options and risks were discussed with the patient.                            All questions were answered, and informed consent                            was obtained. Prior Anticoagulants: The patient has                            taken no previous anticoagulant or antiplatelet                            agents. ASA Grade Assessment: II - A patient with                            mild systemic disease. After reviewing the risks                            and benefits, the patient was deemed in                            satisfactory condition to undergo the procedure.  After obtaining informed consent, the endoscope was                            passed under direct vision. Throughout the                            procedure, the patient's blood pressure, pulse, and                            oxygen saturations were monitored continuously. The                            Endoscope was introduced through the mouth, and                            advanced to the second part of duodenum. The  upper                            GI endoscopy was accomplished without difficulty.                            The patient tolerated the procedure well. Scope In: Scope Out: Findings:                 Mild inflammation was found in the gastric antrum.                            Biopsies were taken with a cold forceps for                            histology.                           LA Grade C reflux related esopagititis with                            resultant edema and minor peptic appearing                            stricture. I used a 16-17-18mm CRE TTS balloon to                            dilate this, held inflated for 1 minute.                           The exam was otherwise without abnormality. Complications:            No immediate complications. Estimated blood loss:                            None. Estimated Blood Loss:     Estimated blood loss: none. Impression:               - Mild, non-specific gastritis. Biopsied to check  for H. pylori.                           - Acid related distal esophagitis and minor peptic                            stricture which I dilated to 46mm today using a TTS                            balloon.                           - The examination was otherwise normal. Recommendation:           - Patient has a contact number available for                            emergencies. The signs and symptoms of potential                            delayed complications were discussed with the                            patient. Return to normal activities tomorrow.                            Written discharge instructions were provided to the                            patient.                           - Resume previous diet.                           - Continue present medications. New prescription                            written today for omeprazole 40mg  pills, one pill                            before your first meal of  the day, disp 1 month, 11                            refills.                           - Await pathology results. Milus Banister, MD 03/26/2021 10:02:00 AM This report has been signed electronically.

## 2021-03-26 NOTE — Progress Notes (Signed)
0930 Robinul 0.1 mg IV given due large amount of secretions upon assessment.  MD made aware, vss  

## 2021-03-26 NOTE — Patient Instructions (Signed)
HANDOUTS PROVIDED ON: GASTRITIS  The biopsies taken today have been sent for pathology.  The results can take 1-3 weeks to receive.    You may resume your previous diet and medication schedule.  Thank you for allowing [place] to care for you today!!!   YOU HAD AN ENDOSCOPIC PROCEDURE TODAY AT [facility]:   Refer to the procedure report that was given to you for any specific questions about what was found during the examination.  If the procedure report does not answer your questions, please call your gastroenterologist to clarify.  If you requested that your care partner not be given the details of your procedure findings, then the procedure report has been included in a sealed envelope for you to review at your convenience later.  YOU SHOULD EXPECT: Some feelings of bloating in the abdomen. Passage of more gas than usual.  Walking can help get rid of the air that was put into your GI tract during the procedure and reduce the bloating.   Please Note:  You might notice some irritation and congestion in your nose or some drainage.  This is from the oxygen used during your procedure.  There is no need for concern and it should clear up in a day or so.  SYMPTOMS TO REPORT IMMEDIATELY:  Following upper endoscopy (EGD)  Vomiting of blood or coffee ground material  New chest pain or pain under the shoulder blades  Painful or persistently difficult swallowing  New shortness of breath  Fever of 100F or higher  Black, tarry-looking stools  For urgent or emergent issues, a gastroenterologist can be reached at any hour by calling [phone]. Do not use MyChart messaging for urgent concerns.    DIET:  We do recommend a small meal at first, but then you may proceed to your regular diet.  Drink plenty of fluids but you should avoid alcoholic beverages for 24 hours.  ACTIVITY:  You should plan to take it easy for the rest of today and you should NOT DRIVE or use heavy machinery until  tomorrow (because of the sedation medicines used during the test).    FOLLOW UP: Our staff will call the number listed on your records Tuesday morning between 7:15 am and 8:15 am following your procedure to check on you and address any questions or concerns that you may have regarding the information given to you following your procedure. If we do not reach you, we will leave a message.  We will attempt to reach you two times.  During this call, we will ask if you have developed any symptoms of COVID 19. If you develop any symptoms (ie: fever, flu-like symptoms, shortness of breath, cough etc.) before then, please call [phone].  If you test positive for Covid 19 in the 2 weeks post procedure, please call and report this information to [place].    If any biopsies were taken you will be contacted by phone or by letter within the next 1-3 weeks.  Please call us at [phone] if you have not heard about the biopsies in 3 weeks.    SIGNATURES/CONFIDENTIALITY: You and/or your care partner have signed paperwork which will be entered into your electronic medical record.  These signatures attest to the fact that that the information above on your After Visit Summary has been reviewed and is understood.  Full responsibility of the confidentiality of this discharge information lies with you and/or your care-partner.

## 2021-03-26 NOTE — Progress Notes (Signed)
Called to room to assist during endoscopic procedure.  Patient ID and intended procedure confirmed with present staff. Received instructions for my participation in the procedure from the performing physician.  

## 2021-03-26 NOTE — Progress Notes (Signed)
°  The recent H&P (dated 3 weeks ago) was reviewed, the patient was examined and there is no change in the patients condition since that H&P was completed.   Milus Banister  03/26/2021, 9:41 AM

## 2021-03-30 ENCOUNTER — Telehealth: Payer: Self-pay

## 2021-03-30 NOTE — Telephone Encounter (Signed)
°  Follow up Call-  Call back number 03/26/2021 06/21/2019  Post procedure Call Back phone  # [phone]  Permission to leave phone message Yes Yes  Some recent data might be hidden     Patient questions:  Do you have a fever, pain , or abdominal swelling? No. Pain Score  0 *  Have you tolerated food without any problems? Yes.    Have you been able to return to your normal activities? Yes.    Do you have any questions about your discharge instructions: Diet   No. Medications  No. Follow up visit  No.  Do you have questions or concerns about your Care? No.  Actions: * If pain score is 4 or above: No action needed, pain <4.

## 2021-04-02 ENCOUNTER — Encounter: Payer: Self-pay | Admitting: Gastroenterology

## 2021-04-16 ENCOUNTER — Other Ambulatory Visit: Payer: Self-pay

## 2021-04-16 ENCOUNTER — Ambulatory Visit (HOSPITAL_COMMUNITY): Payer: BC Managed Care – PPO | Attending: Cardiology

## 2021-04-16 DIAGNOSIS — I351 Nonrheumatic aortic (valve) insufficiency: Secondary | ICD-10-CM | POA: Diagnosis not present

## 2021-04-16 DIAGNOSIS — I77810 Thoracic aortic ectasia: Secondary | ICD-10-CM | POA: Diagnosis not present

## 2021-04-16 LAB — ECHOCARDIOGRAM COMPLETE
Area-P 1/2: 3.03 cm2
P 1/2 time: 412 msec
S' Lateral: 3 cm

## 2021-04-21 ENCOUNTER — Other Ambulatory Visit: Payer: Self-pay | Admitting: Cardiology

## 2021-04-21 ENCOUNTER — Other Ambulatory Visit: Payer: Self-pay | Admitting: Adult Health

## 2021-04-21 DIAGNOSIS — I10 Essential (primary) hypertension: Secondary | ICD-10-CM

## 2021-04-27 ENCOUNTER — Other Ambulatory Visit: Payer: Self-pay | Admitting: *Deleted

## 2021-04-27 DIAGNOSIS — I77810 Thoracic aortic ectasia: Secondary | ICD-10-CM

## 2021-04-27 NOTE — Progress Notes (Signed)
Aortic root is 44 mm, ascending is 41 mm. Both mildly dilated.  ?In one year let's check a CTA of chest to evaluate aorta for comparison to echo. ?  ?Scott Furbish, MD ? ? ?Pt is aware he will need this testing in 1 year.  Aware he will be contacted at a later date to be scheduled. ?

## 2021-05-24 ENCOUNTER — Other Ambulatory Visit: Payer: Self-pay | Admitting: Cardiology

## 2021-06-04 ENCOUNTER — Telehealth: Payer: Self-pay | Admitting: Cardiology

## 2021-06-04 MED ORDER — ROSUVASTATIN CALCIUM 20 MG PO TABS: ORAL_TABLET | ORAL | 0 refills | Status: DC

## 2021-06-04 NOTE — Telephone Encounter (Signed)
Refills have been sent in for patient up until appointment with Dr. Marlou Porch ?

## 2021-06-04 NOTE — Telephone Encounter (Signed)
Pt c/o medication issue: ? ?1. Name of Medication: rosuvastatin (CRESTOR) 20 MG tablet ? ?2. How are you currently taking this medication (dosage and times per day)? As written ? ?3. Are you having a reaction (difficulty breathing--STAT)? No  ? ?4. What is your medication issue? Patient scheduled to see Dr. Marlou Porch on 09/02/21. Please send in refills ?

## 2021-07-27 ENCOUNTER — Other Ambulatory Visit: Payer: Self-pay | Admitting: Adult Health

## 2021-07-27 DIAGNOSIS — I10 Essential (primary) hypertension: Secondary | ICD-10-CM

## 2021-07-27 NOTE — Telephone Encounter (Signed)
Pt needs a CPE for further refills 

## 2021-08-06 ENCOUNTER — Other Ambulatory Visit: Payer: Self-pay | Admitting: Cardiology

## 2021-08-08 ENCOUNTER — Other Ambulatory Visit: Payer: Self-pay | Admitting: Adult Health

## 2021-08-08 DIAGNOSIS — I10 Essential (primary) hypertension: Secondary | ICD-10-CM

## 2021-09-02 ENCOUNTER — Ambulatory Visit: Payer: BC Managed Care – PPO | Admitting: Cardiology

## 2021-09-03 ENCOUNTER — Other Ambulatory Visit: Payer: Self-pay | Admitting: Adult Health

## 2021-09-03 NOTE — Telephone Encounter (Signed)
Patient need to schedule an ov for more refills. 

## 2021-09-08 ENCOUNTER — Telehealth: Payer: Self-pay | Admitting: Adult Health

## 2021-09-08 MED ORDER — LABETALOL HCL 300 MG PO TABS: 300.0000 mg | ORAL_TABLET | Freq: Two times a day (BID) | ORAL | 0 refills | Status: DC

## 2021-09-08 NOTE — Telephone Encounter (Signed)
Pt is calling and has cpe sch for 10-07-2021 and would like refill on labetalol (NORMODYNE) 300 MG tablet  [id number] [place] [place]:  [phone] Fax:  [phone]

## 2021-09-08 NOTE — Telephone Encounter (Signed)
Prescription sent to pharmacy. No other action needed.  

## 2021-09-16 ENCOUNTER — Other Ambulatory Visit: Payer: Self-pay | Admitting: Adult Health

## 2021-09-16 ENCOUNTER — Other Ambulatory Visit: Payer: Self-pay

## 2021-09-16 DIAGNOSIS — I10 Essential (primary) hypertension: Secondary | ICD-10-CM

## 2021-09-16 MED ORDER — ROSUVASTATIN CALCIUM 20 MG PO TABS: ORAL_TABLET | ORAL | 0 refills | Status: DC

## 2021-10-05 ENCOUNTER — Ambulatory Visit: Payer: BC Managed Care – PPO | Admitting: Cardiology

## 2021-10-05 ENCOUNTER — Other Ambulatory Visit: Payer: Self-pay | Admitting: Adult Health

## 2021-10-06 NOTE — Progress Notes (Signed)
Office Visit    Patient Name: Scott Carson Date of Encounter: 10/15/2021  Primary Care Provider:  Dorothyann Peng, NP Primary Cardiologist:  Candee Furbish, MD Primary Electrophysiologist: None  Chief Complaint    Scott Carson is a 51 y.o. male with PMH of CVA in 2018, HLD, HTN, tobacco abuse, mildly dilated ascending aorta, syncope who presents today for follow-up of HTN and HLD.  Past Medical History    Past Medical History:  Diagnosis Date   Arthritis    BACK   CVA (cerebral vascular accident) ([place]) 2020   Eczema of hand    Hypertension    Low back pain    Smoker    Past Surgical History:  Procedure Laterality Date   RESECTION DISTAL CLAVICAL Left 08/13/2015   Procedure: RESECTION DISTAL CLAVICAL;  Surgeon: Ninetta Lights, MD;  Location: [place];  Service: Orthopedics;  Laterality: Left;   SHOULDER ARTHROSCOPY WITH ROTATOR CUFF REPAIR AND SUBACROMIAL DECOMPRESSION Left 08/13/2015   Procedure: LEFT SHOULDER ARTHROSCOPY DEBRIDEMENT ACROMIOPLASTY, DISTAL CLAVICAL EXCISION ROTATOR CUFF REPAIR ;  Surgeon: Ninetta Lights, MD;  Location: [place];  Service: Orthopedics;  Laterality: Left;   TONSILLECTOMY     UPPER GASTROINTESTINAL ENDOSCOPY     2012,2013,2015 [facility]     Allergies  No Known Allergies  History of Present Illness    Scott Carson is a 51 year old male with the above-mentioned past medical history who presents today for follow-up of hypertension and hyperlipidemia.  Scott Carson has a history of CVA that was initially discovered by his optometrist in 10/2016 by MRI of the brain.   He was referred to Dr. Tomi Likens and underwent extensive stroke work-up and started on ASA 81 mg and simvastatin.  MRI of the brain revealed revealed late subacute to early chronic right PCA territory infarct affecting the right occipital lobe. Cardiac CTA of head and neck was completed and revealed no significant large vessel stenosis or occlusion. TTE  with bubble study from 02/17/17 demonstrated EF 55-60% with mildly dilated ascending aorta and negative bubble study.  Scott Carson also wore a 24 hour holter monitor from 03/02/17 did not show any arrhythmias.  Scott Carson was initially seen via referral from PCP by Dr. Marlou Porch on 03/2020 for complaint of syncope.  Blood pressure during visit was 104/70 and patient was currently on HCTZ that was discontinued.  He wore ZIO monitor and had a recheck of 2D echo performed.  Event monitor shows sinus rhythm with average rate of 84 and no pauses or SVT noted.  2D echo completed as well with EF of 60-65%, no RWMA, moderate asymmetric LVH, mild aortic stenosis, aortic dilation present measuring 44 mm and ascending aorta measuring 39 mm.  Scott Carson presents today for annual follow-up.  He is here alone for his visit today.  He reports that he has been doing well with no complaints of chest pain or dizziness.  His blood pressure today was 138/80 with heart rate of 80.  He is currently still smoking but has just recently started Chantix.  We reviewed his previous echo and discussed the importance of smoking cessation and prevention of stroke and heart disease.  He is compliant with his medications and is currently planning to increase his physical activity with walking.  Patient denies chest pain, palpitations, dyspnea, PND, orthopnea, nausea, vomiting, dizziness, syncope, edema, weight gain, or early satiety.  Home Medications    Current Outpatient Medications  Medication Sig Dispense  Refill   amLODipine (NORVASC) 10 MG tablet Take 1 tablet (10 mg total) by mouth daily. 90 tablet 3   aspirin EC 81 MG tablet Take 81 mg by mouth daily.     finasteride (PROSCAR) 5 MG tablet Take 1 tablet (5 mg total) by mouth daily. 90 tablet 3   labetalol (NORMODYNE) 300 MG tablet Take 1 tablet (300 mg total) by mouth 2 (two) times daily. SCHEDULE APPT FOR FUTURE REFILLS 180 tablet 3   Multiple Vitamins-Minerals (CENTRUM ADULTS PO) Take  by mouth.     omeprazole (PRILOSEC) 40 MG capsule Take 1 capsule (40 mg total) by mouth daily before breakfast. 30 capsule 11   rosuvastatin (CRESTOR) 20 MG tablet TAKE 1 TABLET BY MOUTH DAILY. PLEASE KEEP APPT WITH DR. Marlou Porch IN AUGUST 2023 BEFORE ANYMORE REFILLS. 30 tablet 0   varenicline (CHANTIX PAK) 0.5 MG X 11 & 1 MG X 42 tablet Take one 0.5 mg tablet by mouth once daily for 3 days, then increase to one 0.5 mg tablet twice daily for 4 days, then increase to one 1 mg tablet twice daily. 53 tablet 0   varenicline (CHANTIX) 1 MG tablet Take 1 tablet (1 mg total) by mouth 2 (two) times daily. 60 tablet 3   No current facility-administered medications for this visit.     Review of Systems  Please see the history of present illness.    All other systems reviewed and are otherwise negative except as noted above.  Physical Exam    Wt Readings from Last 3 Encounters:  10/15/21 214 lb 3.2 oz (97.2 kg)  10/07/21 210 lb (95.3 kg)  03/26/21 207 lb (93.9 kg)   VS: Vitals:   10/15/21 1403  BP: 138/80  Pulse: 80  SpO2: 98%  ,Body mass index is 29.87 kg/m.  Constitutional:      Appearance: Healthy appearance. Not in distress.  Neck:     Vascular: JVD normal.  Pulmonary:     Effort: Pulmonary effort is normal.     Breath sounds: No wheezing. No rales. Diminished in the bases Cardiovascular:     Normal rate. Regular rhythm. Normal S1. Normal S2.      Murmurs: There is no murmur.  Edema:    Peripheral edema absent.  Abdominal:     Palpations: Abdomen is soft non tender. There is no hepatomegaly.  Skin:    General: Skin is warm and dry.  Neurological:     General: No focal deficit present.     Mental Status: Alert and oriented to person, place and time.     Cranial Nerves: Cranial nerves are intact.  EKG/LABS/Other Studies Reviewed    ECG personally reviewed by me today -sinus rhythm with left atrial enlargement with left axis deviation and left ventricular hypertrophy  Lab  Results  Component Value Date   WBC 4.9 10/07/2021   HGB 15.0 10/07/2021   HCT 45.8 10/07/2021   MCV 91.2 10/07/2021   PLT 237.0 10/07/2021   Lab Results  Component Value Date   CREATININE 0.99 10/07/2021   BUN 12 10/07/2021   NA 139 10/07/2021   K 3.6 10/07/2021   CL 102 10/07/2021   CO2 26 10/07/2021   Lab Results  Component Value Date   ALT 28 10/07/2021   AST 30 10/07/2021   ALKPHOS 67 10/07/2021   BILITOT 1.2 10/07/2021   Lab Results  Component Value Date   CHOL 101 10/07/2021   HDL 43.90 10/07/2021   LDLCALC 50 10/07/2021  TRIG 36.0 10/07/2021   CHOLHDL 2 10/07/2021    Lab Results  Component Value Date   HGBA1C 5.8 10/07/2021    Assessment & Plan    1.  Dilated ascending aorta: -2D echo completed in February 2023 with EF of 60-65%, no RWMA, moderate asymmetric LVH, mild aortic stenosis, aortic dilation present measuring 44 mm and ascending aorta measuring 39 mm. -Per Dr. Marlou Porch will have patient complete CT angio of the chest for evaluation of aortic dilation -Patient advised to keep blood pressure under control and abstain from excess sodium in diet. -Continue current antihypertensive regimen with labetalol 300 mg twice daily and Norvasc 10 mg daily  2.  History of CVA: -MRI of the brain revealed revealed late subacute to early chronic right PCA territory infarct affecting the right occipital lobe -He reports no residual effects or new deficits -Blood pressure today was well controlled and patient compliant with medications as noted above  3.  Hypertension: -Blood pressure today is well controlled at 138/80 -Continue current regimen as noted above  4.  Hyperlipidemia: -Patient's last LDL cholesterol was 50 well-controlled and less than goal of 70 -Continue Crestor 20 mg daily  5.  Tobacco abuse: -Patient currently smoking with advisement to discontinue -Recently started on Chantix PCP  Disposition: Follow-up with Candee Furbish, MD or APP in 12  months   Medication Adjustments/Labs and Tests Ordered: Current medicines are reviewed at length with the patient today.  Concerns regarding medicines are outlined above.   Signed, Mable Fill, Marissa Nestle, NP 10/15/2021, 2:54 PM [place]

## 2021-10-07 ENCOUNTER — Ambulatory Visit (INDEPENDENT_AMBULATORY_CARE_PROVIDER_SITE_OTHER): Payer: BC Managed Care – PPO | Admitting: Adult Health

## 2021-10-07 ENCOUNTER — Encounter: Payer: Self-pay | Admitting: Adult Health

## 2021-10-07 VITALS — BP 120/80 | HR 81 | Temp 98.5°F | Ht 70.75 in | Wt 210.0 lb

## 2021-10-07 DIAGNOSIS — Z0000 Encounter for general adult medical examination without abnormal findings: Secondary | ICD-10-CM | POA: Diagnosis not present

## 2021-10-07 DIAGNOSIS — Z23 Encounter for immunization: Secondary | ICD-10-CM

## 2021-10-07 DIAGNOSIS — I10 Essential (primary) hypertension: Secondary | ICD-10-CM | POA: Diagnosis not present

## 2021-10-07 DIAGNOSIS — E782 Mixed hyperlipidemia: Secondary | ICD-10-CM

## 2021-10-07 DIAGNOSIS — K219 Gastro-esophageal reflux disease without esophagitis: Secondary | ICD-10-CM

## 2021-10-07 DIAGNOSIS — Z125 Encounter for screening for malignant neoplasm of prostate: Secondary | ICD-10-CM

## 2021-10-07 DIAGNOSIS — F17200 Nicotine dependence, unspecified, uncomplicated: Secondary | ICD-10-CM

## 2021-10-07 LAB — COMPREHENSIVE METABOLIC PANEL
ALT: 28 U/L (ref 0–53)
AST: 30 U/L (ref 0–37)
Albumin: 4.4 g/dL (ref 3.5–5.2)
Alkaline Phosphatase: 67 U/L (ref 39–117)
BUN: 12 mg/dL (ref 6–23)
CO2: 26 mEq/L (ref 19–32)
Calcium: 9.3 mg/dL (ref 8.4–10.5)
Chloride: 102 mEq/L (ref 96–112)
Creatinine, Ser: 0.99 mg/dL (ref 0.40–1.50)
GFR: 87.63 mL/min (ref >60.00)
Glucose, Bld: 90 mg/dL (ref 70–99)
Potassium: 3.6 mEq/L (ref 3.5–5.1)
Sodium: 139 mEq/L (ref 135–145)
Total Bilirubin: 1.2 mg/dL (ref 0.2–1.2)
Total Protein: 6.9 g/dL (ref 6.0–8.3)

## 2021-10-07 LAB — CBC WITH DIFFERENTIAL/PLATELET
Basophils Absolute: 0 10*3/uL (ref 0.0–0.1)
Basophils Relative: 0.2 % (ref 0.0–3.0)
Eosinophils Absolute: 0 10*3/uL (ref 0.0–0.7)
Eosinophils Relative: 1 % (ref 0.0–5.0)
HCT: 45.8 % (ref 39.0–52.0)
Hemoglobin: 15 g/dL (ref 13.0–17.0)
Lymphocytes Relative: 40 % (ref 12.0–46.0)
Lymphs Abs: 2 10*3/uL (ref 0.7–4.0)
MCHC: 32.8 g/dL (ref 30.0–36.0)
MCV: 91.2 fl (ref 78.0–100.0)
Monocytes Absolute: 0.5 10*3/uL (ref 0.1–1.0)
Monocytes Relative: 9.7 % (ref 3.0–12.0)
Neutro Abs: 2.4 10*3/uL (ref 1.4–7.7)
Neutrophils Relative %: 49.1 % (ref 43.0–77.0)
Platelets: 237 10*3/uL (ref 150.0–400.0)
RBC: 5.02 Mil/uL (ref 4.22–5.81)
RDW: 14.2 % (ref 11.5–15.5)
WBC: 4.9 10*3/uL (ref 4.0–10.5)

## 2021-10-07 LAB — LIPID PANEL
Cholesterol: 101 mg/dL (ref 0–200)
HDL: 43.9 mg/dL (ref >39.00)
LDL Cholesterol: 50 mg/dL (ref 0–99)
NonHDL: 57.29
Total CHOL/HDL Ratio: 2
Triglycerides: 36 mg/dL (ref 0.0–149.0)
VLDL: 7.2 mg/dL (ref 0.0–40.0)

## 2021-10-07 LAB — PSA: PSA: 2.38 ng/mL (ref 0.10–4.00)

## 2021-10-07 LAB — HEMOGLOBIN A1C: Hgb A1c MFr Bld: 5.8 % (ref 4.6–6.5)

## 2021-10-07 MED ORDER — VARENICLINE TARTRATE 0.5 MG X 11 & 1 MG X 42 PO TBPK: ORAL_TABLET | ORAL | 0 refills | Status: DC

## 2021-10-07 MED ORDER — LABETALOL HCL 300 MG PO TABS: 300.0000 mg | ORAL_TABLET | Freq: Two times a day (BID) | ORAL | 3 refills | Status: DC

## 2021-10-07 MED ORDER — AMLODIPINE BESYLATE 10 MG PO TABS: 10.0000 mg | ORAL_TABLET | Freq: Every day | ORAL | 3 refills | Status: DC

## 2021-10-07 MED ORDER — FINASTERIDE 5 MG PO TABS: 5.0000 mg | ORAL_TABLET | Freq: Every day | ORAL | 3 refills | Status: DC

## 2021-10-07 MED ORDER — VARENICLINE TARTRATE 1 MG PO TABS: 1.0000 mg | ORAL_TABLET | Freq: Two times a day (BID) | ORAL | 3 refills | Status: DC

## 2021-10-07 NOTE — Patient Instructions (Signed)
It was great seeing you today   We will follow up with you regarding your lab work   Please let me know if you need anything   

## 2021-10-07 NOTE — Progress Notes (Signed)
Subjective:    Patient ID: Scott Carson, male    DOB: February 15, 1971, 51 y.o.   MRN: [id number]  HPI Patient presents for yearly preventative medicine examination. He is a pleasant 51 year old male who  has a past medical history of Arthritis, CVA (cerebral vascular accident) ([place]) (2020), Eczema of hand, Hypertension, Low back pain, and Smoker.  HTN -managed with Norvasc 10 mg daily and labetalol 300 mg twice daily.  He denies dizziness, lightheadedness, chest pain, or shortness of breath BP Readings from Last 3 Encounters:  10/07/21 120/80  03/26/21 115/85  02/26/21 134/88   Hyperlipidemia-managed with Crestor 20 mg daily.  He denies myalgia or fatigue Lab Results  Component Value Date   CHOL 97 07/24/2020   HDL 49.50 07/24/2020   LDLCALC 42 07/24/2020   TRIG 28.0 07/24/2020   CHOLHDL 2 07/24/2020    GERD-takes Prilosec 20 mg daily feels well controlled  BPH-currently managed with Proscar 5 mg daily.  Tobacco Use - continues to smoke. He would like to go back on Chantix   All immunizations and health maintenance protocols were reviewed with the patient and needed orders were placed.   Appropriate screening laboratory values were ordered for the patient including screening of hyperlipidemia, renal function and hepatic function. If indicated by BPH, a PSA was ordered.  Medication reconciliation,  past medical history, social history, problem list and allergies were reviewed in detail with the patient  Goals were established with regard to weight loss, exercise, and  diet in compliance with medications  He is up to date on routine colon cancer screening    Review of Systems  Constitutional: Negative.   HENT: Negative.    Eyes: Negative.   Respiratory: Negative.    Cardiovascular: Negative.   Gastrointestinal: Negative.   Endocrine: Negative.   Genitourinary: Negative.   Musculoskeletal: Negative.   Skin: Negative.   Allergic/Immunologic: Negative.   Neurological:  Negative.   Hematological: Negative.   Psychiatric/Behavioral: Negative.    All other systems reviewed and are negative.  Past Medical History:  Diagnosis Date   Arthritis    BACK   CVA (cerebral vascular accident) ([place]) 2020   Eczema of hand    Hypertension    Low back pain    Smoker     Social History   Socioeconomic History   Marital status: Married    Spouse name: Kennyth Lose   Number of children: 3   Years of education: Not on file   Highest education level: 12th grade  Occupational History   Occupation: Designer, multimedia    Comment: Dance movement psychotherapist  Tobacco Use   Smoking status: Every Day    Packs/day: 0.50    Types: Cigars, Cigarettes    Start date: 04/30/2012    Last attempt to quit: 07/01/2013    Years since quitting: 8.2   Smokeless tobacco: Never  Substance and Sexual Activity   Alcohol use: Not Currently    Comment: occasionaly   Drug use: Not Currently    Types: Marijuana    Comment: smoked 06/20/19   Sexual activity: Not on file  Other Topics Concern   Not on file  Social History Narrative   ** Merged History Encounter **       Married Alcohol use-no    Current Smoker Occupation: Architect     Pt quit smoking cigarettes and cigars with Chantix. Does use cannibus. Drinks 2-3 cuos of coffee and 2-3 glasses of tea a day. No regular exercise, though  is active at work. He is    an only child.      Social Determinants of Health   Financial Resource Strain: Not on file  Food Insecurity: Not on file  Transportation Needs: Not on file  Physical Activity: Not on file  Stress: Not on file  Social Connections: Not on file  Intimate Partner Violence: Not on file    Past Surgical History:  Procedure Laterality Date   RESECTION DISTAL CLAVICAL Left 08/13/2015   Procedure: RESECTION DISTAL CLAVICAL;  Surgeon: Ninetta Lights, MD;  Location: [place];  Service: Orthopedics;  Laterality: Left;   SHOULDER ARTHROSCOPY WITH ROTATOR CUFF REPAIR  AND SUBACROMIAL DECOMPRESSION Left 08/13/2015   Procedure: LEFT SHOULDER ARTHROSCOPY DEBRIDEMENT ACROMIOPLASTY, DISTAL CLAVICAL EXCISION ROTATOR CUFF REPAIR ;  Surgeon: Ninetta Lights, MD;  Location: [place];  Service: Orthopedics;  Laterality: Left;   TONSILLECTOMY     UPPER GASTROINTESTINAL ENDOSCOPY     2012,2013,2015 [facility]     Family History  Problem Relation Age of Onset   Hypertension Mother    Glaucoma Father    Diabetes Maternal Grandmother    Stroke Maternal Grandmother    Colon cancer Paternal Grandfather    Other Neg Hx        no family history of colon cancer   Stomach cancer Neg Hx    Esophageal cancer Neg Hx    Prostate cancer Neg Hx    Colon polyps Neg Hx    Rectal cancer Neg Hx     No Known Allergies  Current Outpatient Medications on File Prior to Visit  Medication Sig Dispense Refill   amLODipine (NORVASC) 10 MG tablet TAKE 1 TABLET BY MOUTH EVERY DAY 30 tablet 0   aspirin EC 81 MG tablet Take 81 mg by mouth daily.     finasteride (PROSCAR) 5 MG tablet Take 1 tablet (5 mg total) by mouth daily. 90 tablet 3   hydrochlorothiazide (MICROZIDE) 12.5 MG capsule TAKE 1 CAPSULE BY MOUTH EVERY DAY 90 capsule 3   labetalol (NORMODYNE) 300 MG tablet Take 1 tablet (300 mg total) by mouth 2 (two) times daily. SCHEDULE APPT FOR FUTURE REFILLS 60 tablet 0   Multiple Vitamins-Minerals (CENTRUM ADULTS PO) Take by mouth.     omeprazole (PRILOSEC) 40 MG capsule Take 1 capsule (40 mg total) by mouth daily before breakfast. 30 capsule 11   rosuvastatin (CRESTOR) 20 MG tablet TAKE 1 TABLET BY MOUTH DAILY. PLEASE KEEP APPT WITH DR. Marlou Porch IN AUGUST 2023 BEFORE ANYMORE REFILLS. 30 tablet 0   No current facility-administered medications on file prior to visit.    BP 120/80   Pulse 81   Temp 98.5 F (36.9 C) (Oral)   Ht 5' 10.75" (1.797 m)   Wt 210 lb (95.3 kg)   SpO2 97%   BMI 29.50 kg/m       Objective:   Physical Exam Vitals and nursing note  reviewed.  Constitutional:      General: He is not in acute distress.    Appearance: Normal appearance. He is well-developed and normal weight.  HENT:     Head: Normocephalic and atraumatic.     Right Ear: Tympanic membrane, ear canal and external ear normal. There is no impacted cerumen.     Left Ear: Tympanic membrane, ear canal and external ear normal. There is no impacted cerumen.     Nose: Nose normal. No congestion or rhinorrhea.     Mouth/Throat:  Mouth: Mucous membranes are moist.     Pharynx: Oropharynx is clear. No oropharyngeal exudate or posterior oropharyngeal erythema.  Eyes:     General:        Right eye: No discharge.        Left eye: No discharge.     Extraocular Movements: Extraocular movements intact.     Conjunctiva/sclera: Conjunctivae normal.     Pupils: Pupils are equal, round, and reactive to light.  Neck:     Vascular: No carotid bruit.     Trachea: No tracheal deviation.  Cardiovascular:     Rate and Rhythm: Normal rate and regular rhythm.     Pulses: Normal pulses.     Heart sounds: Normal heart sounds. No murmur heard.    No friction rub. No gallop.  Pulmonary:     Effort: Pulmonary effort is normal. No respiratory distress.     Breath sounds: Normal breath sounds. No stridor. No wheezing, rhonchi or rales.  Chest:     Chest wall: No tenderness.  Abdominal:     General: Bowel sounds are normal. There is no distension.     Palpations: Abdomen is soft. There is no mass.     Tenderness: There is no abdominal tenderness. There is no right CVA tenderness, left CVA tenderness, guarding or rebound.     Hernia: No hernia is present.  Musculoskeletal:        General: No swelling, tenderness, deformity or signs of injury. Normal range of motion.     Right lower leg: No edema.     Left lower leg: No edema.  Lymphadenopathy:     Cervical: No cervical adenopathy.  Skin:    General: Skin is warm and dry.     Capillary Refill: Capillary refill takes less  than 2 seconds.     Coloration: Skin is not jaundiced or pale.     Findings: No bruising, erythema, lesion or rash.  Neurological:     General: No focal deficit present.     Mental Status: He is alert and oriented to person, place, and time.     Cranial Nerves: No cranial nerve deficit.     Sensory: No sensory deficit.     Motor: No weakness.     Coordination: Coordination normal.     Gait: Gait normal.     Deep Tendon Reflexes: Reflexes normal.  Psychiatric:        Mood and Affect: Mood normal.        Behavior: Behavior normal.        Thought Content: Thought content normal.        Judgment: Judgment normal.       Assessment & Plan:  1. Routine general medical examination at a health care facility - Needs to quit smoking  - stay active and eat healthy  - CBC with Differential/Platelet; Future - Comprehensive metabolic panel; Future - Hemoglobin A1c; Future - Lipid panel; Future  2. Mixed hyperlipidemia - Continue with statin. Follow up with cardiology  - CBC with Differential/Platelet; Future - Comprehensive metabolic panel; Future - Hemoglobin A1c; Future - Lipid panel; Future  3. Essential hypertension - Controlled. No changes in medication  - CBC with Differential/Platelet; Future - Comprehensive metabolic panel; Future - Hemoglobin A1c; Future - Lipid panel; Future - amLODipine (NORVASC) 10 MG tablet; Take 1 tablet (10 mg total) by mouth daily.  Dispense: 90 tablet; Refill: 3  4. BPH   - PSA; Future - finasteride (PROSCAR) 5 MG tablet; Take  1 tablet (5 mg total) by mouth daily.  Dispense: 90 tablet; Refill: 3  5. Gastroesophageal reflux disease, unspecified whether esophagitis present - Continue with PPI   6. Tobacco use disorder  - varenicline (CHANTIX) 1 MG tablet; Take 1 tablet (1 mg total) by mouth 2 (two) times daily.  Dispense: 60 tablet; Refill: 3 - varenicline (CHANTIX PAK) 0.5 MG X 11 & 1 MG X 42 tablet; Take one 0.5 mg tablet by mouth once daily  for 3 days, then increase to one 0.5 mg tablet twice daily for 4 days, then increase to one 1 mg tablet twice daily.  Dispense: 53 tablet; Refill: 0  7. Need for shingles vaccine  - Varicella-zoster vaccine IM  Dorothyann Peng, NP

## 2021-10-15 ENCOUNTER — Encounter: Payer: Self-pay | Admitting: Nurse Practitioner

## 2021-10-15 ENCOUNTER — Ambulatory Visit: Payer: BC Managed Care – PPO | Admitting: Nurse Practitioner

## 2021-10-15 VITALS — BP 138/80 | HR 80 | Ht 71.0 in | Wt 214.2 lb

## 2021-10-15 DIAGNOSIS — F17200 Nicotine dependence, unspecified, uncomplicated: Secondary | ICD-10-CM

## 2021-10-15 DIAGNOSIS — I10 Essential (primary) hypertension: Secondary | ICD-10-CM

## 2021-10-15 DIAGNOSIS — I77810 Thoracic aortic ectasia: Secondary | ICD-10-CM

## 2021-10-15 DIAGNOSIS — Z8639 Personal history of other endocrine, nutritional and metabolic disease: Secondary | ICD-10-CM

## 2021-10-15 NOTE — Patient Instructions (Signed)
Medication Instructions:  Your physician recommends that you continue on your current medications as directed. Please refer to the Current Medication list given to you today.  *If you need a refill on your cardiac medications before your next appointment, please call your pharmacy*   Lab Work: None ordered  If you have labs (blood work) drawn today and your tests are completely normal, you will receive your results only by: [facility] (if you have MyChart) OR A paper copy in the mail If you have any lab test that is abnormal or we need to change your treatment, we will call you to review the results.   Testing/Procedures: We will get the CTA that Dr. Marlou Porch ordered in March scheduled.     Follow-Up: At [facility], you and your health needs are our priority.  As part of our continuing mission to provide you with exceptional heart care, we have created designated Provider Care Teams.  These Care Teams include your primary Cardiologist (physician) and Advanced Practice Providers (APPs -  Physician Assistants and Nurse Practitioners) who all work together to provide you with the care you need, when you need it.  We recommend signing up for the patient portal called "MyChart".  Sign up information is provided on this After Visit Summary.  MyChart is used to connect with patients for Virtual Visits (Telemedicine).  Patients are able to view lab/test results, encounter notes, upcoming appointments, etc.  Non-urgent messages can be sent to your provider as well.   To learn more about what you can do with MyChart, go to [email or web address].    Your next appointment:   1 year(s)  The format for your next appointment:   In Person  Provider:   Candee Furbish, MD  or Ambrose Pancoast, NP         Other Instructions   Important Information About Sugar

## 2021-10-18 NOTE — Addendum Note (Signed)
Addended by: Briant Cedar on: 10/18/2021 03:45 PM   Modules accepted: Orders

## 2021-10-19 NOTE — Addendum Note (Signed)
Addended by: Briant Cedar on: 10/19/2021 08:43 AM   Modules accepted: Orders

## 2021-10-26 ENCOUNTER — Other Ambulatory Visit: Payer: Self-pay | Admitting: Adult Health

## 2021-10-26 DIAGNOSIS — F17200 Nicotine dependence, unspecified, uncomplicated: Secondary | ICD-10-CM

## 2021-11-19 ENCOUNTER — Other Ambulatory Visit: Payer: Self-pay | Admitting: Cardiology

## 2022-01-07 ENCOUNTER — Ambulatory Visit: Payer: BC Managed Care – PPO

## 2022-01-08 ENCOUNTER — Other Ambulatory Visit: Payer: Self-pay | Admitting: Adult Health

## 2022-01-08 DIAGNOSIS — F17200 Nicotine dependence, unspecified, uncomplicated: Secondary | ICD-10-CM

## 2022-01-10 ENCOUNTER — Ambulatory Visit (INDEPENDENT_AMBULATORY_CARE_PROVIDER_SITE_OTHER): Payer: BC Managed Care – PPO

## 2022-01-10 DIAGNOSIS — Z23 Encounter for immunization: Secondary | ICD-10-CM | POA: Diagnosis not present

## 2022-01-11 NOTE — Telephone Encounter (Signed)
Does the starter pack needs to be filled again?

## 2022-03-28 ENCOUNTER — Ambulatory Visit (HOSPITAL_BASED_OUTPATIENT_CLINIC_OR_DEPARTMENT_OTHER)
Admission: RE | Admit: 2022-03-28 | Discharge: 2022-03-28 | Disposition: A | Discharge status: Home / Self Care | Payer: BC Managed Care – PPO | Source: Ambulatory Visit | Attending: Cardiology | Admitting: Cardiology

## 2022-03-28 ENCOUNTER — Encounter (HOSPITAL_BASED_OUTPATIENT_CLINIC_OR_DEPARTMENT_OTHER): Payer: Self-pay

## 2022-03-28 ENCOUNTER — Ambulatory Visit (HOSPITAL_BASED_OUTPATIENT_CLINIC_OR_DEPARTMENT_OTHER): Admission: RE | Admit: 2022-03-28 | Payer: BC Managed Care – PPO | Source: Ambulatory Visit

## 2022-03-28 DIAGNOSIS — I77810 Thoracic aortic ectasia: Secondary | ICD-10-CM | POA: Diagnosis not present

## 2022-03-28 LAB — POCT I-STAT CREATININE: Creatinine, Ser: 1.1 mg/dL (ref 0.61–1.24)

## 2022-03-28 MED ORDER — IOHEXOL 300 MG/ML  SOLN: 100.0000 mL | Freq: Once | INTRAMUSCULAR | Status: DC | PRN

## 2022-03-28 MED ORDER — IOHEXOL 350 MG/ML SOLN
100.0000 mL | Freq: Once | INTRAVENOUS | Status: AC | PRN
  Administered 2022-03-28: 75 mL via INTRAVENOUS

## 2022-03-29 ENCOUNTER — Ambulatory Visit: Payer: BC Managed Care – PPO | Admitting: Adult Health

## 2022-04-01 ENCOUNTER — Ambulatory Visit: Payer: BC Managed Care – PPO | Admitting: Adult Health

## 2022-04-01 ENCOUNTER — Encounter: Payer: Self-pay | Admitting: Adult Health

## 2022-04-01 VITALS — BP 122/84 | HR 73 | Temp 97.5°F | Ht 71.0 in | Wt 215.0 lb

## 2022-04-01 DIAGNOSIS — M25511 Pain in right shoulder: Secondary | ICD-10-CM

## 2022-04-01 DIAGNOSIS — K4090 Unilateral inguinal hernia, without obstruction or gangrene, not specified as recurrent: Secondary | ICD-10-CM | POA: Diagnosis not present

## 2022-04-01 NOTE — Progress Notes (Signed)
Subjective:    Patient ID: Scott Carson, male    DOB: 1971-01-19, 52 y.o.   MRN: [id number]  HPI 52 year old male who  has a past medical history of Arthritis, CVA (cerebral vascular accident) ([place]) (2020), Eczema of hand, Hypertension, Low back pain, and Smoker.  He presents to the office today for a mass in groin that he has had for awhile" This mass becomes hard when he uses the bathroom and has sex. Denies pain   He is also having some right shoulder pain with loss of ROM    Review of Systems See HPI   Past Medical History:  Diagnosis Date   Arthritis    BACK   CVA (cerebral vascular accident) ([place]) 2020   Eczema of hand    Hypertension    Low back pain    Smoker     Social History   Socioeconomic History   Marital status: Married    Spouse name: Kennyth Lose   Number of children: 3   Years of education: Not on file   Highest education level: 12th grade  Occupational History   Occupation: Designer, multimedia    Comment: Dance movement psychotherapist  Tobacco Use   Smoking status: Every Day    Packs/day: 0.50    Types: Cigars, Cigarettes    Start date: 04/30/2012    Last attempt to quit: 07/01/2013    Years since quitting: 8.7   Smokeless tobacco: Never  Substance and Sexual Activity   Alcohol use: Not Currently    Comment: occasionaly   Drug use: Not Currently    Types: Marijuana    Comment: smoked 06/20/19   Sexual activity: Not on file  Other Topics Concern   Not on file  Social History Narrative   ** Merged History Encounter **       Married Alcohol use-no    Current Smoker Occupation: Architect     Pt quit smoking cigarettes and cigars with Chantix. Does use cannibus. Drinks 2-3 cuos of coffee and 2-3 glasses of tea a day. No regular exercise, though is active at work. He is    an only child.      Social Determinants of Health   Financial Resource Strain: Not on file  Food Insecurity: Not on file  Transportation Needs: Not on file  Physical Activity: Not on  file  Stress: Not on file  Social Connections: Not on file  Intimate Partner Violence: Not on file    Past Surgical History:  Procedure Laterality Date   RESECTION DISTAL CLAVICAL Left 08/13/2015   Procedure: RESECTION DISTAL CLAVICAL;  Surgeon: Ninetta Lights, MD;  Location: [facility] [facility];  Service: Orthopedics;  Laterality: Left;   SHOULDER ARTHROSCOPY WITH ROTATOR CUFF REPAIR AND SUBACROMIAL DECOMPRESSION Left 08/13/2015   Procedure: LEFT SHOULDER ARTHROSCOPY DEBRIDEMENT ACROMIOPLASTY, DISTAL CLAVICAL EXCISION ROTATOR CUFF REPAIR ;  Surgeon: Ninetta Lights, MD;  Location: [place];  Service: Orthopedics;  Laterality: Left;   TONSILLECTOMY     UPPER GASTROINTESTINAL ENDOSCOPY     2012,2013,2015 [facility]     Family History  Problem Relation Age of Onset   Hypertension Mother    Glaucoma Father    Diabetes Maternal Grandmother    Stroke Maternal Grandmother    Colon cancer Paternal Grandfather    Other Neg Hx        no family history of colon cancer   Stomach cancer Neg Hx    Esophageal cancer Neg Hx  Prostate cancer Neg Hx    Colon polyps Neg Hx    Rectal cancer Neg Hx     No Known Allergies  Current Outpatient Medications on File Prior to Visit  Medication Sig Dispense Refill   amLODipine (NORVASC) 10 MG tablet Take 1 tablet (10 mg total) by mouth daily. 90 tablet 3   aspirin EC 81 MG tablet Take 81 mg by mouth daily.     finasteride (PROSCAR) 5 MG tablet Take 1 tablet (5 mg total) by mouth daily. 90 tablet 3   labetalol (NORMODYNE) 300 MG tablet Take 1 tablet (300 mg total) by mouth 2 (two) times daily. SCHEDULE APPT FOR FUTURE REFILLS 180 tablet 3   Multiple Vitamins-Minerals (CENTRUM ADULTS PO) Take by mouth.     omeprazole (PRILOSEC) 40 MG capsule Take 1 capsule (40 mg total) by mouth daily before breakfast. 30 capsule 11   rosuvastatin (CRESTOR) 20 MG tablet TAKE 1 TABLET BY MOUTH DAILY. 90 tablet 3   varenicline (CHANTIX PAK) 0.5 MG X  11 & 1 MG X 42 tablet Take one 0.5 mg tablet by mouth once daily for 3 days, then increase to one 0.5 mg tablet twice daily for 4 days, then increase to one 1 mg tablet twice daily. 53 tablet 0   varenicline (CHANTIX) 1 MG tablet TAKE 1 TABLET BY MOUTH TWICE A DAY 180 tablet 1   No current facility-administered medications on file prior to visit.    BP 122/84   Pulse 73   Temp (!) 97.5 F (36.4 C) (Oral)   Ht 5' 11"$  (1.803 m)   Wt 215 lb (97.5 kg)   SpO2 99%   BMI 29.99 kg/m       Objective:   Physical Exam Vitals and nursing note reviewed.  Constitutional:      Appearance: Normal appearance.  Cardiovascular:     Rate and Rhythm: Normal rate and regular rhythm.     Pulses: Normal pulses.     Heart sounds: Normal heart sounds.  Pulmonary:     Effort: Pulmonary effort is normal.     Breath sounds: Normal breath sounds.  Abdominal:     General: Abdomen is flat. Bowel sounds are normal.     Palpations: Abdomen is soft.     Hernia: A hernia is present. Hernia is present in the right inguinal area.  Musculoskeletal:     Right shoulder: Bony tenderness present. Decreased range of motion. Decreased strength.  Skin:    General: Skin is warm and dry.  Neurological:     General: No focal deficit present.     Mental Status: He is oriented to person, place, and time.  Psychiatric:        Mood and Affect: Mood normal.        Behavior: Behavior normal.        Thought Content: Thought content normal.        Judgment: Judgment normal.       Assessment & Plan:  1. Non-recurrent unilateral inguinal hernia without obstruction or gangrene - Easily reduced. Reviewed red flags .Will refer to General Surgery  - Ambulatory referral to General Surgery  2. Acute pain of right shoulder - Possibly rotator cuff repair. He is going to go through [facility]   Dorothyann Peng, NP

## 2022-04-05 ENCOUNTER — Other Ambulatory Visit: Payer: Self-pay | Admitting: Adult Health

## 2022-04-05 ENCOUNTER — Other Ambulatory Visit: Payer: Self-pay | Admitting: Gastroenterology

## 2022-04-05 DIAGNOSIS — K222 Esophageal obstruction: Secondary | ICD-10-CM

## 2022-04-05 DIAGNOSIS — F17200 Nicotine dependence, unspecified, uncomplicated: Secondary | ICD-10-CM

## 2022-04-05 DIAGNOSIS — K2970 Gastritis, unspecified, without bleeding: Secondary | ICD-10-CM

## 2022-04-05 DIAGNOSIS — K219 Gastro-esophageal reflux disease without esophagitis: Secondary | ICD-10-CM

## 2022-04-05 DIAGNOSIS — R1310 Dysphagia, unspecified: Secondary | ICD-10-CM

## 2022-04-06 NOTE — Telephone Encounter (Signed)
This is a patient of Dr Ardis Hughs.  Please advise if OK to refill as you are DOD.  Thank you

## 2022-04-07 ENCOUNTER — Encounter: Payer: Self-pay | Admitting: *Deleted

## 2022-04-07 ENCOUNTER — Telehealth: Payer: Self-pay | Admitting: Cardiology

## 2022-04-07 MED ORDER — VARENICLINE TARTRATE 1 MG PO TABS: 1.0000 mg | ORAL_TABLET | Freq: Two times a day (BID) | ORAL | 1 refills | Status: DC

## 2022-04-07 NOTE — Telephone Encounter (Signed)
Pt returning call. Please advise. °

## 2022-04-07 NOTE — Telephone Encounter (Signed)
That's fine, okay to refill for one year. Thanks

## 2022-04-07 NOTE — Telephone Encounter (Signed)
Patient stated someone from the clinic called however, they did not leave a message. Verified there was not a recent  telephone encounter or note created from this clinic. Patient voiced understanding.

## 2022-04-07 NOTE — Telephone Encounter (Signed)
Okay for refill?  

## 2022-07-08 ENCOUNTER — Ambulatory Visit (HOSPITAL_COMMUNITY)
Admission: EM | Admit: 2022-07-08 | Discharge: 2022-07-08 | Disposition: A | Discharge status: Home / Self Care | Payer: BC Managed Care – PPO | Attending: Emergency Medicine | Admitting: Emergency Medicine

## 2022-07-08 ENCOUNTER — Encounter (HOSPITAL_COMMUNITY): Payer: Self-pay

## 2022-07-08 DIAGNOSIS — S61209A Unspecified open wound of unspecified finger without damage to nail, initial encounter: Secondary | ICD-10-CM | POA: Diagnosis not present

## 2022-07-08 MED ORDER — MUPIROCIN CALCIUM 2 % EX CREA: 1.0000 | TOPICAL_CREAM | Freq: Two times a day (BID) | CUTANEOUS | 0 refills | Status: DC

## 2022-07-08 NOTE — ED Provider Notes (Signed)
[facility]    CSN: [id number] Arrival date & time: 07/08/22  1722      History   Chief Complaint Chief Complaint  Patient presents with   Finger Injury    HPI Scott Carson is a 52 y.o. male.   Patient presents to clinic over a cut to his left pinky finger that occurred yesterday with a piece of plastic.  He applied a bandage to the area yesterday.  Reports the last Tdap was updated 2 or 3 years ago.  Sensation and movement intact.  Patient has not taken anything for pain.  The history is provided by the patient and medical records.    Past Medical History:  Diagnosis Date   Arthritis    BACK   CVA (cerebral vascular accident) ([facility]) 2020   Eczema of hand    Hypertension    Low back pain    Smoker     Patient Active Problem List   Diagnosis Date Noted   Dysphagia 02/26/2021   Stenosis of carotid artery 04/23/2020   Dilated aortic root ([facility]) 04/23/2020   GERD (gastroesophageal reflux disease) 07/10/2019   OAG (open angle glaucoma) suspect, low risk, bilateral 02/18/2018   Tobacco use disorder 06/27/2014   Hand dermatitis 04/23/2014   Dizziness 01/24/2014   Left rotator cuff tear 12/20/2013   Urinary frequency 05/17/2013   Low back pain 01/07/2011   Essential hypertension 09/15/2006    Past Surgical History:  Procedure Laterality Date   RESECTION DISTAL CLAVICAL Left 08/13/2015   Procedure: RESECTION DISTAL CLAVICAL;  Surgeon: Loreta Ave, MD;  Location: [facility];  Service: Orthopedics;  Laterality: Left;   SHOULDER ARTHROSCOPY WITH ROTATOR CUFF REPAIR AND SUBACROMIAL DECOMPRESSION Left 08/13/2015   Procedure: LEFT SHOULDER ARTHROSCOPY DEBRIDEMENT ACROMIOPLASTY, DISTAL CLAVICAL EXCISION ROTATOR CUFF REPAIR ;  Surgeon: Loreta Ave, MD;  Location: [facility];  Service: Orthopedics;  Laterality: Left;   TONSILLECTOMY     UPPER GASTROINTESTINAL ENDOSCOPY     2012,2013,2015 [facility]        Home Medications     Prior to Admission medications   Medication Sig Start Date End Date Taking? Authorizing Provider  mupirocin cream (BACTROBAN) 2 % Apply 1 Application topically 2 (two) times daily. 07/08/22  Yes Rinaldo Ratel, [place] N, FNP  amLODipine (NORVASC) 10 MG tablet Take 1 tablet (10 mg total) by mouth daily. 10/07/21   Nafziger, Kandee Keen, NP  aspirin EC 81 MG tablet Take 81 mg by mouth daily.    [provider]  finasteride (PROSCAR) 5 MG tablet Take 1 tablet (5 mg total) by mouth daily. 10/07/21   Nafziger, Kandee Keen, NP  labetalol (NORMODYNE) 300 MG tablet Take 1 tablet (300 mg total) by mouth 2 (two) times daily. SCHEDULE APPT FOR FUTURE REFILLS 10/07/21 04/01/22  Shirline Frees, NP  Multiple Vitamins-Minerals (CENTRUM ADULTS PO) Take by mouth.    [provider]  omeprazole (PRILOSEC) 40 MG capsule TAKE 1 CAPSULE BY MOUTH EVERY DAY BEFORE BREAKFAST 04/07/22   Armbruster, Willaim Rayas, MD  rosuvastatin (CRESTOR) 20 MG tablet TAKE 1 TABLET BY MOUTH DAILY. 11/19/21   Jake Bathe, MD  varenicline (CHANTIX PAK) 0.5 MG X 11 & 1 MG X 42 tablet Take one 0.5 mg tablet by mouth once daily for 3 days, then increase to one 0.5 mg tablet twice daily for 4 days, then increase to one 1 mg tablet twice daily. 10/07/21   Nafziger, Kandee Keen, NP  varenicline (CHANTIX) 1 MG tablet  Take 1 tablet (1 mg total) by mouth 2 (two) times daily. 04/07/22   Nafziger, Kandee Keen, NP    Family History Family History  Problem Relation Age of Onset   Hypertension Mother    Glaucoma Father    Diabetes Maternal Grandmother    Stroke Maternal Grandmother    Colon cancer Paternal Grandfather    Other Neg Hx        no family history of colon cancer   Stomach cancer Neg Hx    Esophageal cancer Neg Hx    Prostate cancer Neg Hx    Colon polyps Neg Hx    Rectal cancer Neg Hx     Social History Social History   Tobacco Use   Smoking status: Every Day    Packs/day: .5    Types: Cigars, Cigarettes    Start date: 04/30/2012    Last attempt  to quit: 07/01/2013    Years since quitting: 9.0   Smokeless tobacco: Never  Substance Use Topics   Alcohol use: Not Currently    Comment: occasionaly   Drug use: Not Currently    Types: Marijuana    Comment: smoked 06/20/19     Allergies   Patient has no known allergies.   Review of Systems Review of Systems   Physical Exam Triage Vital Signs ED Triage Vitals  Enc Vitals Group     BP 07/08/22 1741 (!) 154/96     Pulse Rate 07/08/22 1741 84     Resp 07/08/22 1741 18     Temp 07/08/22 1741 98.4 F (36.9 C)     Temp Source 07/08/22 1741 Oral     SpO2 07/08/22 1741 98 %     Weight --      Height --      Head Circumference --      Peak Flow --      Pain Score 07/08/22 1742 0     Pain Loc --      Pain Edu? --      Excl. in GC? --    No data found.  Updated Vital Signs BP (!) 154/96 (BP Location: Left Arm)   Pulse 84   Temp 98.4 F (36.9 C) (Oral)   Resp 18   SpO2 98%   Visual Acuity Right Eye Distance:   Left Eye Distance:   Bilateral Distance:    Right Eye Near:   Left Eye Near:    Bilateral Near:     Physical Exam Vitals and nursing note reviewed.  Constitutional:      Appearance: Normal appearance.  HENT:     Head: Normocephalic and atraumatic.     Right Ear: External ear normal.     Left Ear: External ear normal.     Nose: Nose normal.     Mouth/Throat:     Mouth: Mucous membranes are moist.  Eyes:     Conjunctiva/sclera: Conjunctivae normal.  Cardiovascular:     Rate and Rhythm: Normal rate.  Pulmonary:     Effort: Pulmonary effort is normal. No respiratory distress.  Musculoskeletal:        General: Signs of injury present. No swelling, tenderness or deformity. Normal range of motion.     Right hand: Laceration present. Normal range of motion. Normal strength. Normal sensation. There is no disruption of two-point discrimination. Normal capillary refill.     Cervical back: Normal range of motion.  Skin:    General: Skin is warm and dry.   Neurological:  Mental Status: He is alert.  Psychiatric:        Behavior: Behavior is cooperative.      UC Treatments / Results  Labs (all labs ordered are listed, but only abnormal results are displayed) Labs Reviewed - No data to display  EKG   Radiology No results found.  Procedures Procedures (including critical care time)  Medications Ordered in UC Medications - No data to display  Initial Impression / Assessment and Plan / UC Course  I have reviewed the triage vital signs and the nursing notes.  Pertinent labs & imaging results that were available during my care of the patient were reviewed by me and considered in my medical decision making (see chart for details).  Vitals and triage reviewed, patient is hemodynamically stable.  Left pinky finger with the top layer of skin removed, wound bed is shallow, edges are unable to be approximated.  Is not a candidate for suturing.  Entire wound bed is able to be visualized, no tendon or ligament involvement.  Up-to-date on Tdap.  Discussed nonsutured wound care and antibacterial solutions and ointments.  Return precautions given for signs of infection.  Patient verbalized understanding, no questions at this time.     Final Clinical Impressions(s) / UC Diagnoses   Final diagnoses:  Open wound of finger without damage to nail, initial encounter     Discharge Instructions      Your skin is not able to be closed due to how wide and shallow your wound is. Please do a warm soak with an antibacterial solution like Hibiclens or a capful hydrogen peroxide.  After you soak it, please pat dry and apply the antibacterial ointment.  The area will gradually heal and close itself.  Your Tdap is well within the range and does not need to be updated today.  You can alternate between Tylenol and ibuprofen every 4-6 hours as needed for pain.   Please feel to look out for signs of infection such as redness, purulent drainage, warmth,  swelling, streaking, or increased pain.      ED Prescriptions     Medication Sig Dispense Auth. Provider   mupirocin cream (BACTROBAN) 2 % Apply 1 Application topically 2 (two) times daily. 15 g Kimiye Strathman, [place] N, [place]      PDMP not reviewed this encounter.   Jordana Dugue, [place] N, [place] 07/08/22 [phone]

## 2022-07-08 NOTE — Discharge Instructions (Addendum)
Your skin is not able to be closed due to how wide and shallow your wound is. Please do a warm soak with an antibacterial solution like Hibiclens or a capful hydrogen peroxide.  After you soak it, please pat dry and apply the antibacterial ointment.  The area will gradually heal and close itself.  Your Tdap is well within the range and does not need to be updated today.  You can alternate between Tylenol and ibuprofen every 4-6 hours as needed for pain.   Please feel to look out for signs of infection such as redness, purulent drainage, warmth, swelling, streaking, or increased pain.

## 2022-07-08 NOTE — ED Triage Notes (Signed)
Pt states cut his finger yesterday around 330pm. States a piece of plastic came down and landed on his lt pinky and cut it. Bandage was applied.

## 2022-08-04 ENCOUNTER — Telehealth: Payer: Self-pay | Admitting: Adult Health

## 2022-08-04 DIAGNOSIS — F17200 Nicotine dependence, unspecified, uncomplicated: Secondary | ICD-10-CM

## 2022-08-04 DIAGNOSIS — K4090 Unilateral inguinal hernia, without obstruction or gangrene, not specified as recurrent: Secondary | ICD-10-CM | POA: Diagnosis not present

## 2022-08-04 NOTE — Telephone Encounter (Signed)
Prescription Request  08/04/2022  LOV: 04/01/2022  What is the name of the medication or equipment?  varenicline (CHANTIX PAK) 0.5 MG X 11 & 1 MG X 42 tablet or varenicline (CHANTIX) 1 MG tablet   Have you contacted your pharmacy to request a refill? No   Which pharmacy would you like this sent to?  [id number], [place] - [address] [place] [address] Phone: [phone] Fax: [phone]    Patient notified that their request is being sent to the clinical staff for review and that they should receive a response within 2 business days.   Please advise at Mobile [phone] (mobile)

## 2022-08-05 MED ORDER — VARENICLINE TARTRATE 1 MG PO TABS: 1.0000 mg | ORAL_TABLET | Freq: Two times a day (BID) | ORAL | 1 refills | Status: DC

## 2022-08-05 NOTE — Telephone Encounter (Signed)
Noted  

## 2022-08-05 NOTE — Telephone Encounter (Signed)
Please advise 

## 2022-08-05 NOTE — Telephone Encounter (Signed)
Patient notified of update  and verbalized understanding. 

## 2022-10-01 ENCOUNTER — Other Ambulatory Visit: Payer: Self-pay | Admitting: Adult Health

## 2022-10-01 DIAGNOSIS — Z125 Encounter for screening for malignant neoplasm of prostate: Secondary | ICD-10-CM

## 2022-10-04 ENCOUNTER — Ambulatory Visit: Payer: BC Managed Care – PPO | Admitting: Adult Health

## 2022-10-06 ENCOUNTER — Encounter: Payer: Self-pay | Admitting: Adult Health

## 2022-10-06 ENCOUNTER — Ambulatory Visit: Payer: BC Managed Care – PPO | Admitting: Adult Health

## 2022-10-06 ENCOUNTER — Encounter (INDEPENDENT_AMBULATORY_CARE_PROVIDER_SITE_OTHER): Payer: Self-pay

## 2022-10-06 VITALS — BP 120/86 | HR 76 | Temp 98.1°F | Ht 71.0 in | Wt 206.0 lb

## 2022-10-06 DIAGNOSIS — M25512 Pain in left shoulder: Secondary | ICD-10-CM | POA: Diagnosis not present

## 2022-10-06 DIAGNOSIS — G8929 Other chronic pain: Secondary | ICD-10-CM

## 2022-10-06 DIAGNOSIS — B078 Other viral warts: Secondary | ICD-10-CM | POA: Diagnosis not present

## 2022-10-06 DIAGNOSIS — R7303 Prediabetes: Secondary | ICD-10-CM

## 2022-10-06 LAB — POCT GLYCOSYLATED HEMOGLOBIN (HGB A1C): Hemoglobin A1C: 5.7 % — AB (ref 4.0–5.6)

## 2022-10-06 MED ORDER — METHYLPREDNISOLONE ACETATE 80 MG/ML IJ SUSP
80.0000 mg | Freq: Once | INTRAMUSCULAR | Status: AC
  Administered 2022-10-06: 80 mg via INTRA_ARTICULAR

## 2022-10-06 NOTE — Patient Instructions (Signed)
Health Maintenance Due  Topic Date Due   COVID-19 Vaccine (4 - 2023-24 season) 10/22/2021   INFLUENZA VACCINE  09/22/2022       10/07/2021    7:57 AM 07/24/2020    7:24 AM 07/24/2020    7:04 AM  Depression screen PHQ 2/9  Decreased Interest 0 0 0  Down, Depressed, Hopeless 0 0 0  PHQ - 2 Score 0 0 0

## 2022-10-06 NOTE — Progress Notes (Addendum)
Subjective:    Patient ID: Scott Carson, male    DOB: May 01, 1970, 52 y.o.   MRN: [id number]  HPI 52 year old male who  has a past medical history of Arthritis, CVA (cerebral vascular accident) ([facility]) (2020), Eczema of hand, Hypertension, Low back pain, and Smoker.  He presents to the office today with multiple complaints   He has known left shoulder pain for multiple years, has history of rotator cuff repair in the left shoulder in 2017.  He reports that over the last 6 months he has had worsening pain in his left shoulder.  Has range of motion but has pain with raising his arm over his head and when lifting objects at work.  Feels as though he does have some decreased strength in his left arm.  He denies any numbness or tingling.  Denies any trauma. He is wondering if he can get a steroid injection today.   He also has a "spot" on the palm of his left hand that he is concerned about.  This spot is painful spot is painful when grabbing items and with touch.  He does not remember having a splinter or any other foreign object going to his hand.  It has been painful for roughly 6 months.  Has not noticed any redness, warmth, or drainage from this area  Additionally, he is concerned that he may have diabetes due to family history of diabetes mellitus.  He denies polyuria or polydipsia.  He has had his A1c checked in the past with readings in the 5.7 range.  Review of Systems See HPI   Past Medical History:  Diagnosis Date   Arthritis    BACK   CVA (cerebral vascular accident) ([facility]) 2020   Eczema of hand    Hypertension    Low back pain    Smoker     Social History   Socioeconomic History   Marital status: Married    Spouse name: Annice Pih   Number of children: 3   Years of education: Not on file   Highest education level: 12th grade  Occupational History   Occupation: Agricultural consultant    Comment: Advice worker  Tobacco Use   Smoking status: Every Day    Current packs/day: 0.00     Average packs/day: 0.5 packs/day for 1.2 years (0.6 ttl pk-yrs)    Types: Cigars, Cigarettes    Start date: 04/30/2012    Last attempt to quit: 07/01/2013    Years since quitting: 9.2   Smokeless tobacco: Never  Substance and Sexual Activity   Alcohol use: Not Currently    Comment: occasionaly   Drug use: Not Currently    Types: Marijuana    Comment: smoked 06/20/19   Sexual activity: Not on file  Other Topics Concern   Not on file  Social History Narrative   ** Merged History Encounter **       Married Alcohol use-no    Current Smoker Occupation: Holiday representative     Pt quit smoking cigarettes and cigars with Chantix. Does use cannibus. Drinks 2-3 cuos of coffee and 2-3 glasses of tea a day. No regular exercise, though is active at work. He is    an only child.      Social Determinants of Health   Financial Resource Strain: Not on file  Food Insecurity: Not on file  Transportation Needs: Not on file  Physical Activity: Not on file  Stress: Not on file  Social Connections: Not on file  Intimate Partner Violence: Not on file    Past Surgical History:  Procedure Laterality Date   RESECTION DISTAL CLAVICAL Left 08/13/2015   Procedure: RESECTION DISTAL CLAVICAL;  Surgeon: Loreta Ave, MD;  Location: [facility];  Service: Orthopedics;  Laterality: Left;   SHOULDER ARTHROSCOPY WITH ROTATOR CUFF REPAIR AND SUBACROMIAL DECOMPRESSION Left 08/13/2015   Procedure: LEFT SHOULDER ARTHROSCOPY DEBRIDEMENT ACROMIOPLASTY, DISTAL CLAVICAL EXCISION ROTATOR CUFF REPAIR ;  Surgeon: Loreta Ave, MD;  Location: [facility];  Service: Orthopedics;  Laterality: Left;   TONSILLECTOMY     UPPER GASTROINTESTINAL ENDOSCOPY     2012,2013,2015 [facility]     Family History  Problem Relation Age of Onset   Hypertension Mother    Glaucoma Father    Diabetes Maternal Grandmother    Stroke Maternal Grandmother    Colon cancer Paternal Grandfather    Other Neg Hx         no family history of colon cancer   Stomach cancer Neg Hx    Esophageal cancer Neg Hx    Prostate cancer Neg Hx    Colon polyps Neg Hx    Rectal cancer Neg Hx     No Known Allergies  Current Outpatient Medications on File Prior to Visit  Medication Sig Dispense Refill   amLODipine (NORVASC) 10 MG tablet Take 1 tablet (10 mg total) by mouth daily. 90 tablet 3   aspirin EC 81 MG tablet Take 81 mg by mouth daily.     finasteride (PROSCAR) 5 MG tablet TAKE 1 TABLET (5 MG TOTAL) BY MOUTH DAILY. 90 tablet 3   labetalol (NORMODYNE) 300 MG tablet TAKE 1 TABLET (300 MG TOTAL) BY MOUTH 2 (TWO) TIMES DAILY. SCHEDULE APPT FOR FUTURE REFILLS 180 tablet 0   Multiple Vitamins-Minerals (CENTRUM ADULTS PO) Take by mouth.     mupirocin cream (BACTROBAN) 2 % Apply 1 Application topically 2 (two) times daily. 15 g 0   omeprazole (PRILOSEC) 40 MG capsule TAKE 1 CAPSULE BY MOUTH EVERY DAY BEFORE BREAKFAST 90 capsule 3   rosuvastatin (CRESTOR) 20 MG tablet TAKE 1 TABLET BY MOUTH DAILY. 90 tablet 3   varenicline (CHANTIX) 1 MG tablet Take 1 tablet (1 mg total) by mouth 2 (two) times daily. 180 tablet 1   No current facility-administered medications on file prior to visit.    Pulse 76   Temp 98.1 F (36.7 C) (Oral)   Ht 5\' 11"  (1.803 m)   Wt 206 lb (93.4 kg)   SpO2 99%   BMI 28.73 kg/m       Objective:   Physical Exam Vitals and nursing note reviewed.  Constitutional:      Appearance: Normal appearance.  Musculoskeletal:        General: Normal range of motion.     Left shoulder: Bony tenderness present. No crepitus. Decreased strength.     Comments: Is able to raise his arm above 90 degrees but does have pain with doing so.  Has pain with internal rotation of the left shoulder against resistance.  Is able to perform back scratch test and empty can test without much difficulty.  Skin:    General: Skin is warm and dry.     Comments: Has a flat flesh like hard neoplasm on the palmar surface of  his left hand.  Appears as a common wart  Neurological:     General: No focal deficit present.     Mental Status: He is alert and oriented to  person, place, and time.  Psychiatric:        Mood and Affect: Mood normal.        Behavior: Behavior normal.        Thought Content: Thought content normal.        Judgment: Judgment normal.       Assessment & Plan:  1. Chronic left shoulder pain Shoulder injection Verbal consent obtained and verified. Sterile betadine prep. Furthur cleansed with alcohol. Topical analgesic spray: Ethyl chloride. Joint: left subacromial injection Approached in typical fashion with: posterior approach Completed without difficulty Meds: 3 cc lidocaine 2% no epi, 1 cc depomedrol 80mg /cc Needle:1.5 inch 25 gauge Aftercare instructions and Red flags advised. Immediate improvement in pain noted  - methylPREDNISolone acetate (DEPO-MEDROL) injection 80 mg  2. Prediabetes  - POC HgB A1c- 5.7  - Encouraged healthy eating and diet  3. Common wart -Will consent obtained and using liquid nitrogen 3 freeze thaw cycles were done.  Patient tolerated procedure well.  Aftercare instructions reviewed, he can use Tylenol or Motrin if needed for pain relief.  Shirline Frees, NP   Time spent with patient today was 33  minutes which consisted of chart review, discussing left shoulder pain, common warts, prediabetes, and family history of diabetes work up, treatment answering questions and documentation. This time was separate from the therapeutic injection of left shoulder as well as cryotherapy

## 2022-11-11 ENCOUNTER — Encounter: Payer: BC Managed Care – PPO | Admitting: Adult Health

## 2022-11-25 ENCOUNTER — Encounter: Payer: BC Managed Care – PPO | Admitting: Adult Health

## 2022-12-23 ENCOUNTER — Encounter: Payer: BC Managed Care – PPO | Admitting: Adult Health

## 2022-12-29 ENCOUNTER — Other Ambulatory Visit: Payer: Self-pay | Admitting: Family Medicine

## 2022-12-31 ENCOUNTER — Other Ambulatory Visit: Payer: Self-pay | Admitting: Adult Health

## 2022-12-31 DIAGNOSIS — I10 Essential (primary) hypertension: Secondary | ICD-10-CM

## 2023-01-27 ENCOUNTER — Ambulatory Visit (INDEPENDENT_AMBULATORY_CARE_PROVIDER_SITE_OTHER): Payer: BC Managed Care – PPO | Admitting: Adult Health

## 2023-01-27 ENCOUNTER — Encounter: Payer: Self-pay | Admitting: Adult Health

## 2023-01-27 VITALS — BP 122/94 | HR 77 | Temp 98.0°F | Ht 71.0 in | Wt 201.4 lb

## 2023-01-27 DIAGNOSIS — M25512 Pain in left shoulder: Secondary | ICD-10-CM

## 2023-01-27 DIAGNOSIS — E782 Mixed hyperlipidemia: Secondary | ICD-10-CM

## 2023-01-27 DIAGNOSIS — Z0000 Encounter for general adult medical examination without abnormal findings: Secondary | ICD-10-CM | POA: Diagnosis not present

## 2023-01-27 DIAGNOSIS — I10 Essential (primary) hypertension: Secondary | ICD-10-CM | POA: Diagnosis not present

## 2023-01-27 DIAGNOSIS — Z23 Encounter for immunization: Secondary | ICD-10-CM

## 2023-01-27 DIAGNOSIS — Z125 Encounter for screening for malignant neoplasm of prostate: Secondary | ICD-10-CM | POA: Diagnosis not present

## 2023-01-27 DIAGNOSIS — F17200 Nicotine dependence, unspecified, uncomplicated: Secondary | ICD-10-CM

## 2023-01-27 DIAGNOSIS — K219 Gastro-esophageal reflux disease without esophagitis: Secondary | ICD-10-CM | POA: Diagnosis not present

## 2023-01-27 DIAGNOSIS — G8929 Other chronic pain: Secondary | ICD-10-CM

## 2023-01-27 LAB — LIPID PANEL
Cholesterol: 147 mg/dL (ref 0–200)
HDL: 47.7 mg/dL (ref >39.00)
LDL Cholesterol: 91 mg/dL (ref 0–99)
NonHDL: 99.01
Total CHOL/HDL Ratio: 3
Triglycerides: 39 mg/dL (ref 0.0–149.0)
VLDL: 7.8 mg/dL (ref 0.0–40.0)

## 2023-01-27 LAB — COMPREHENSIVE METABOLIC PANEL
ALT: 20 U/L (ref 0–53)
AST: 24 U/L (ref 0–37)
Albumin: 4.4 g/dL (ref 3.5–5.2)
Alkaline Phosphatase: 65 U/L (ref 39–117)
BUN: 13 mg/dL (ref 6–23)
CO2: 29 meq/L (ref 19–32)
Calcium: 9.3 mg/dL (ref 8.4–10.5)
Chloride: 105 meq/L (ref 96–112)
Creatinine, Ser: 0.94 mg/dL (ref 0.40–1.50)
GFR: 92.4 mL/min (ref >60.00)
Glucose, Bld: 105 mg/dL — ABNORMAL HIGH (ref 70–99)
Potassium: 3.8 meq/L (ref 3.5–5.1)
Sodium: 141 meq/L (ref 135–145)
Total Bilirubin: 0.8 mg/dL (ref 0.2–1.2)
Total Protein: 7 g/dL (ref 6.0–8.3)

## 2023-01-27 LAB — CBC
HCT: 43.1 % (ref 39.0–52.0)
Hemoglobin: 14.7 g/dL (ref 13.0–17.0)
MCHC: 34.2 g/dL (ref 30.0–36.0)
MCV: 91.5 fL (ref 78.0–100.0)
Platelets: 265 10*3/uL (ref 150.0–400.0)
RBC: 4.71 Mil/uL (ref 4.22–5.81)
RDW: 13.7 % (ref 11.5–15.5)
WBC: 4.7 10*3/uL (ref 4.0–10.5)

## 2023-01-27 LAB — PSA: PSA: 0.91 ng/mL (ref 0.10–4.00)

## 2023-01-27 LAB — TSH: TSH: 0.6 u[IU]/mL (ref 0.35–5.50)

## 2023-01-27 MED ORDER — ROSUVASTATIN CALCIUM 20 MG PO TABS: ORAL_TABLET | ORAL | 3 refills | Status: DC

## 2023-01-27 NOTE — Progress Notes (Signed)
Subjective:    Patient ID: Scott Carson, male    DOB: Jan 26, 1971, 52 y.o.   MRN: [id number]  HPI Patient presents for yearly preventative medicine examination. He is a pleasant 52 year old male who  has a past medical history of Arthritis, CVA (cerebral vascular accident) (HCC) (2020), Eczema of hand, Hypertension, Low back pain, and Smoker.  HTN -managed with Norvasc 10 mg daily and labetalol 300 mg twice daily.  He denies dizziness, lightheadedness, chest pain, or shortness of breath BP Readings from Last 3 Encounters:  01/27/23 (!) 136/96  10/06/22 120/86  07/08/22 (!) 154/96   Hyperlipidemia-managed with Crestor 20 mg daily.  He denies myalgia or fatigue. He has not been taking it due to not being able to go to Cardiology due to financial reasons.  Lab Results  Component Value Date   CHOL 101 10/07/2021   HDL 43.90 10/07/2021   LDLCALC 50 10/07/2021   TRIG 36.0 10/07/2021   CHOLHDL 2 10/07/2021   GERD-takes Prilosec 20 mg daily feels well controlled  BPH-has sexual side effects with Proscar and Flomax. He has mildly decreased urine stream.   Tobacco Use - he has not smoked since June 2024 after doing Chantix  Chronic left shoulder pain - has a history of rotator cuff repair in 2017. Continues to have pain in his left shoulder with movement. We did a steroid injection about 3 months ago but he did not get relief from this. Pain can wake him up at night  All immunizations and health maintenance protocols were reviewed with the patient and needed orders were placed.  Appropriate screening laboratory values were ordered for the patient including screening of hyperlipidemia, renal function and hepatic function. If indicated by BPH, a PSA was ordered.  Medication reconciliation,  past medical history, social history, problem list and allergies were reviewed in detail with the patient  Goals were established with regard to weight loss, exercise, and  diet in compliance with  medications  He is up to date on routine colon cancer screening     Review of Systems  Constitutional: Negative.   HENT: Negative.    Eyes: Negative.   Respiratory: Negative.    Cardiovascular: Negative.   Gastrointestinal: Negative.   Endocrine: Negative.   Genitourinary: Negative.   Musculoskeletal: Negative.   Skin: Negative.   Allergic/Immunologic: Negative.   Neurological: Negative.   Hematological: Negative.   Psychiatric/Behavioral: Negative.    All other systems reviewed and are negative.  Past Medical History:  Diagnosis Date   Arthritis    BACK   CVA (cerebral vascular accident) (HCC) 2020   Eczema of hand    Hypertension    Low back pain    Smoker     Social History   Socioeconomic History   Marital status: Married    Spouse name: Annice Pih   Number of children: 3   Years of education: Not on file   Highest education level: 12th grade  Occupational History   Occupation: Agricultural consultant    Comment: Advice worker  Tobacco Use   Smoking status: Former    Current packs/day: 0.50    Average packs/day: 0.5 packs/day for 1.7 years (0.8 ttl pk-yrs)    Types: Cigars, Cigarettes    Start date: 04/30/2012    Quit date: 07/01/2013   Smokeless tobacco: Never  Substance and Sexual Activity   Alcohol use: Not Currently    Comment: occasionaly   Drug use: Not Currently    Types: Marijuana  Comment: smoked 06/20/19   Sexual activity: Not on file  Other Topics Concern   Not on file  Social History Narrative   ** Merged History Encounter **       Married Alcohol use-no    Current Smoker Occupation: Holiday representative     Pt quit smoking cigarettes and cigars with Chantix. Does use cannibus. Drinks 2-3 cuos of coffee and 2-3 glasses of tea a day. No regular exercise, though is active at work. He is    an only child.      Social Determinants of Health   Financial Resource Strain: Low Risk  (01/27/2023)   Overall Financial Resource Strain (CARDIA)     Difficulty of Paying Living Expenses: Not hard at all  Food Insecurity: Not on file  Transportation Needs: No Transportation Needs (01/27/2023)   PRAPARE - Administrator, Civil Service (Medical): No    Lack of Transportation (Non-Medical): No  Physical Activity: Inactive (01/27/2023)   Exercise Vital Sign    Days of Exercise per Week: 0 days    Minutes of Exercise per Session: 0 min  Stress: No Stress Concern Present (01/27/2023)   [employer] of Occupational Health - Occupational Stress Questionnaire    Feeling of Stress : Not at all  Social Connections: Moderately Integrated (01/27/2023)   Social Connection and Isolation Panel [NHANES]    Frequency of Communication with Friends and Family: Never    Frequency of Social Gatherings with Friends and Family: Never    Attends Religious Services: More than 4 times per year    Active Member of Clubs or Organizations: Yes    Attends Banker Meetings: More than 4 times per year    Marital Status: Married  Catering manager Violence: Not At Risk (01/27/2023)   Humiliation, Afraid, Rape, and Kick questionnaire    Fear of Current or Ex-Partner: No    Emotionally Abused: No    Physically Abused: No    Sexually Abused: No    Past Surgical History:  Procedure Laterality Date   RESECTION DISTAL CLAVICAL Left 08/13/2015   Procedure: RESECTION DISTAL CLAVICAL;  Surgeon: Loreta Ave, MD;  Location: [facility];  Service: Orthopedics;  Laterality: Left;   SHOULDER ARTHROSCOPY WITH ROTATOR CUFF REPAIR AND SUBACROMIAL DECOMPRESSION Left 08/13/2015   Procedure: LEFT SHOULDER ARTHROSCOPY DEBRIDEMENT ACROMIOPLASTY, DISTAL CLAVICAL EXCISION ROTATOR CUFF REPAIR ;  Surgeon: Loreta Ave, MD;  Location: [facility];  Service: Orthopedics;  Laterality: Left;   TONSILLECTOMY     UPPER GASTROINTESTINAL ENDOSCOPY     2012,2013,2015 [facility]     Family History  Problem Relation Age of Onset    Hypertension Mother    Glaucoma Father    Diabetes Maternal Grandmother    Stroke Maternal Grandmother    Colon cancer Paternal Grandfather    Other Neg Hx        no family history of colon cancer   Stomach cancer Neg Hx    Esophageal cancer Neg Hx    Prostate cancer Neg Hx    Colon polyps Neg Hx    Rectal cancer Neg Hx     No Known Allergies  Current Outpatient Medications on File Prior to Visit  Medication Sig Dispense Refill   amLODipine (NORVASC) 10 MG tablet TAKE 1 TABLET BY MOUTH EVERY DAY 90 tablet 3   aspirin EC 81 MG tablet Take 81 mg by mouth daily.     labetalol (NORMODYNE) 300 MG tablet TAKE 1  TABLET (300 MG TOTAL) BY MOUTH 2 (TWO) TIMES DAILY. SCHEDULE APPT FOR FUTURE REFILLS 180 tablet 0   omeprazole (PRILOSEC) 40 MG capsule TAKE 1 CAPSULE BY MOUTH EVERY DAY BEFORE BREAKFAST 90 capsule 3   varenicline (CHANTIX) 1 MG tablet Take 1 tablet (1 mg total) by mouth 2 (two) times daily. (Patient not taking: Reported on 01/27/2023) 180 tablet 1   No current facility-administered medications on file prior to visit.    BP (!) 136/96 (BP Location: Right Arm, Patient Position: Sitting, Cuff Size: Large)   Pulse 77   Temp 98 F (36.7 C) (Oral)   Ht 5\' 11"  (1.803 m)   Wt 201 lb 6.4 oz (91.4 kg)   SpO2 98%   BMI 28.09 kg/m       Objective:   Physical Exam Vitals and nursing note reviewed.  Constitutional:      General: He is not in acute distress.    Appearance: Normal appearance. He is not ill-appearing.  HENT:     Head: Normocephalic and atraumatic.     Right Ear: Tympanic membrane, ear canal and external ear normal. There is no impacted cerumen.     Left Ear: Tympanic membrane, ear canal and external ear normal. There is no impacted cerumen.     Nose: Nose normal. No congestion or rhinorrhea.     Mouth/Throat:     Mouth: Mucous membranes are moist.     Pharynx: Oropharynx is clear.  Eyes:     Extraocular Movements: Extraocular movements intact.      Conjunctiva/sclera: Conjunctivae normal.     Pupils: Pupils are equal, round, and reactive to light.  Neck:     Vascular: No carotid bruit.  Cardiovascular:     Rate and Rhythm: Normal rate and regular rhythm.     Pulses: Normal pulses.     Heart sounds: No murmur heard.    No friction rub. No gallop.  Pulmonary:     Effort: Pulmonary effort is normal.     Breath sounds: Normal breath sounds.  Abdominal:     General: Abdomen is flat. Bowel sounds are normal. There is no distension.     Palpations: Abdomen is soft. There is no mass.     Tenderness: There is no abdominal tenderness. There is no guarding or rebound.     Hernia: No hernia is present.  Musculoskeletal:     Left shoulder: Bony tenderness present. No crepitus. Decreased range of motion. Decreased strength.     Cervical back: Normal range of motion and neck supple.  Lymphadenopathy:     Cervical: No cervical adenopathy.  Skin:    General: Skin is warm and dry.     Capillary Refill: Capillary refill takes less than 2 seconds.  Neurological:     General: No focal deficit present.     Mental Status: He is alert and oriented to person, place, and time.  Psychiatric:        Mood and Affect: Mood normal.        Behavior: Behavior normal.        Thought Content: Thought content normal.        Judgment: Judgment normal.        Assessment & Plan:  1. Routine general medical examination at a health care facility Today patient counseled on age appropriate routine health concerns for screening and prevention, each reviewed and up to date or declined. Immunizations reviewed and up to date or declined. Labs ordered and reviewed. Risk factors  for depression reviewed and negative. Hearing function and visual acuity are intact. ADLs screened and addressed as needed. Functional ability and level of safety reviewed and appropriate. Education, counseling and referrals performed based on assessed risks today. Patient provided with a copy  of personalized plan for preventive services. - Eat healthy and stay active  - Follow up in one year or sooner if needed  2. Essential hypertension - Continue  on current therapy. He took his medication just PTA today.  - Lipid panel; Future - TSH; Future - CBC; Future - Comprehensive metabolic panel; Future  3. Mixed hyperlipidemia - Will send in crestor for him  - Lipid panel; Future - TSH; Future - CBC; Future - Comprehensive metabolic panel; Future - rosuvastatin (CRESTOR) 20 MG tablet; TAKE 1 TABLET BY MOUTH DAILY.  Dispense: 90 tablet; Refill: 3  4. Gastroesophageal reflux disease without esophagitis - Continue PPI  - Lipid panel; Future - TSH; Future - CBC; Future - Comprehensive metabolic panel; Future  5. Prostate cancer screening  - PSA; Future  6. Tobacco use disorder - Congratulated on quitting smoking   7. Chronic left shoulder pain - will xray today. And refer back to [facility].  - DG Shoulder Left; Future  8. Need for influenza vaccination  - Flu vaccine trivalent PF, 6mos and older(Flulaval,Afluria,Fluarix,Fluzone)  Shirline Frees, NP

## 2023-01-27 NOTE — Patient Instructions (Signed)
It was great seeing you today   We will follow up with you regarding your lab work   Please let me know if you need anything   

## 2023-03-28 ENCOUNTER — Other Ambulatory Visit: Payer: Self-pay | Admitting: Adult Health

## 2023-04-01 ENCOUNTER — Other Ambulatory Visit: Payer: Self-pay | Admitting: Gastroenterology

## 2023-04-01 DIAGNOSIS — K219 Gastro-esophageal reflux disease without esophagitis: Secondary | ICD-10-CM

## 2023-04-01 DIAGNOSIS — K2970 Gastritis, unspecified, without bleeding: Secondary | ICD-10-CM

## 2023-04-01 DIAGNOSIS — R1310 Dysphagia, unspecified: Secondary | ICD-10-CM

## 2023-04-01 DIAGNOSIS — K222 Esophageal obstruction: Secondary | ICD-10-CM

## 2023-04-04 ENCOUNTER — Other Ambulatory Visit: Payer: Self-pay | Admitting: Adult Health

## 2023-04-26 ENCOUNTER — Other Ambulatory Visit: Payer: Self-pay | Admitting: Gastroenterology

## 2023-04-26 DIAGNOSIS — K2970 Gastritis, unspecified, without bleeding: Secondary | ICD-10-CM

## 2023-04-26 DIAGNOSIS — R1310 Dysphagia, unspecified: Secondary | ICD-10-CM

## 2023-04-26 DIAGNOSIS — K222 Esophageal obstruction: Secondary | ICD-10-CM

## 2023-04-26 DIAGNOSIS — K219 Gastro-esophageal reflux disease without esophagitis: Secondary | ICD-10-CM

## 2023-05-12 ENCOUNTER — Ambulatory Visit: Admitting: Adult Health

## 2023-05-12 ENCOUNTER — Ambulatory Visit

## 2023-05-12 VITALS — BP 124/80 | HR 72 | Temp 98.0°F | Ht 71.0 in | Wt 209.0 lb

## 2023-05-12 DIAGNOSIS — G8929 Other chronic pain: Secondary | ICD-10-CM

## 2023-05-12 DIAGNOSIS — M25512 Pain in left shoulder: Secondary | ICD-10-CM

## 2023-05-12 DIAGNOSIS — N401 Enlarged prostate with lower urinary tract symptoms: Secondary | ICD-10-CM | POA: Diagnosis not present

## 2023-05-12 DIAGNOSIS — M19011 Primary osteoarthritis, right shoulder: Secondary | ICD-10-CM | POA: Diagnosis not present

## 2023-05-12 DIAGNOSIS — M25511 Pain in right shoulder: Secondary | ICD-10-CM

## 2023-05-12 DIAGNOSIS — M19012 Primary osteoarthritis, left shoulder: Secondary | ICD-10-CM | POA: Diagnosis not present

## 2023-05-12 DIAGNOSIS — R351 Nocturia: Secondary | ICD-10-CM

## 2023-05-12 MED ORDER — TAMSULOSIN HCL 0.4 MG PO CAPS: 0.4000 mg | ORAL_CAPSULE | Freq: Every day | ORAL | 3 refills | Status: DC

## 2023-05-12 NOTE — Patient Instructions (Signed)
 I am going to xray your shoulders today and refer you to orthopedics   I am going to place you back on Flomax to help with urinating at night. Let me know how you are doing in a few weeks.   Your Cardiologist is  Dr. Donato Schultz Address: [address], [place], [place] [address] Phone: [phone]

## 2023-05-12 NOTE — Progress Notes (Signed)
 Subjective:    Patient ID: Scott Carson, male    DOB: 1970/08/19, 53 y.o.   MRN: [id number]  HPI 53 year old male who  has a past medical history of Arthritis, CVA (cerebral vascular accident) (HCC) (2020), Eczema of hand, Hypertension, Low back pain, and Smoker.  He is being evaluated today for multiple complaints.  He has a history of chronic bilateral shoulder pain and a left sided rotator cuff repair in 2017.  In August 2024 he was seen for worsening pain in his left shoulder and had pain with raising his arm over his head and when lifting objects at work.  He denied any numbness or tingling down his arm.  We decided to do a steroid injection to see if this would give him some relief.  He reports today that the steroid injection did not help.  He continues to have loss of range of motion in his left shoulder as well as in his right shoulder and he is "tired of dealing with the pain".  He is ready to see orthopedics.  Furthermore, he is experiencing nocturia which again is a chronic issue for him.  In the past he was on Flomax but had not had any refills since 2022 and is unsure of why he stopped this medication.  He is getting up in the middle the night 4-5 times every night to urinate and will often have a decreased stream.   Review of Systems See HPI   Past Medical History:  Diagnosis Date   Arthritis    BACK   CVA (cerebral vascular accident) (HCC) 2020   Eczema of hand    Hypertension    Low back pain    Smoker     Social History   Socioeconomic History   Marital status: Married    Spouse name: Annice Pih   Number of children: 3   Years of education: Not on file   Highest education level: 12th grade  Occupational History   Occupation: Agricultural consultant    Comment: Advice worker  Tobacco Use   Smoking status: Former    Current packs/day: 0.50    Average packs/day: 0.5 packs/day for 2.0 years (1.0 ttl pk-yrs)    Types: Cigars, Cigarettes    Start date: 04/30/2012     Quit date: 07/01/2013   Smokeless tobacco: Never  Substance and Sexual Activity   Alcohol use: Not Currently    Comment: occasionaly   Drug use: Not Currently    Types: Marijuana    Comment: smoked 06/20/19   Sexual activity: Not on file  Other Topics Concern   Not on file  Social History Narrative   ** Merged History Encounter **       Married Alcohol use-no    Current Smoker Occupation: Holiday representative     Pt quit smoking cigarettes and cigars with Chantix. Does use cannibus. Drinks 2-3 cuos of coffee and 2-3 glasses of tea a day. No regular exercise, though is active at work. He is    an only child.      Social Drivers of Corporate investment banker Strain: Low Risk  (01/27/2023)   Overall Financial Resource Strain (CARDIA)    Difficulty of Paying Living Expenses: Not hard at all  Food Insecurity: Not on file  Transportation Needs: No Transportation Needs (01/27/2023)   PRAPARE - Administrator, Civil Service (Medical): No    Lack of Transportation (Non-Medical): No  Physical Activity: Inactive (01/27/2023)  Exercise Vital Sign    Days of Exercise per Week: 0 days    Minutes of Exercise per Session: 0 min  Stress: No Stress Concern Present (01/27/2023)   [employer] of Occupational Health - Occupational Stress Questionnaire    Feeling of Stress : Not at all  Social Connections: Moderately Integrated (01/27/2023)   Social Connection and Isolation Panel [NHANES]    Frequency of Communication with Friends and Family: Never    Frequency of Social Gatherings with Friends and Family: Never    Attends Religious Services: More than 4 times per year    Active Member of Clubs or Organizations: Yes    Attends Banker Meetings: More than 4 times per year    Marital Status: Married  Catering manager Violence: Not At Risk (01/27/2023)   Humiliation, Afraid, Rape, and Kick questionnaire    Fear of Current or Ex-Partner: No    Emotionally Abused: No     Physically Abused: No    Sexually Abused: No    Past Surgical History:  Procedure Laterality Date   RESECTION DISTAL CLAVICAL Left 08/13/2015   Procedure: RESECTION DISTAL CLAVICAL;  Surgeon: Loreta Ave, MD;  Location: [facility];  Service: Orthopedics;  Laterality: Left;   SHOULDER ARTHROSCOPY WITH ROTATOR CUFF REPAIR AND SUBACROMIAL DECOMPRESSION Left 08/13/2015   Procedure: LEFT SHOULDER ARTHROSCOPY DEBRIDEMENT ACROMIOPLASTY, DISTAL CLAVICAL EXCISION ROTATOR CUFF REPAIR ;  Surgeon: Loreta Ave, MD;  [facility];  Service: Orthopedics;  Laterality: Left;   TONSILLECTOMY     UPPER GASTROINTESTINAL ENDOSCOPY     2012,2013,2015 [facility]     Family History  Problem Relation Age of Onset   Hypertension Mother    Glaucoma Father    Diabetes Maternal Grandmother    Stroke Maternal Grandmother    Colon cancer Paternal Grandfather    Other Neg Hx        no family history of colon cancer   Stomach cancer Neg Hx    Esophageal cancer Neg Hx    Prostate cancer Neg Hx    Colon polyps Neg Hx    Rectal cancer Neg Hx     No Known Allergies  Current Outpatient Medications on File Prior to Visit  Medication Sig Dispense Refill   amLODipine (NORVASC) 10 MG tablet TAKE 1 TABLET BY MOUTH EVERY DAY 90 tablet 3   aspirin EC 81 MG tablet Take 81 mg by mouth daily.     labetalol (NORMODYNE) 300 MG tablet TAKE 1 TABLET BY MOUTH TWICE A DAY 180 tablet 0   omeprazole (PRILOSEC) 40 MG capsule Take 1 capsule (40 mg total) by mouth daily. Please schedule an office visit for further refills. Thank you 30 capsule 1   rosuvastatin (CRESTOR) 20 MG tablet TAKE 1 TABLET BY MOUTH DAILY. 90 tablet 3   No current facility-administered medications on file prior to visit.    BP 124/80   Pulse 72   Temp 98 F (36.7 C) (Oral)   Ht 5\' 11"  (1.803 m)   Wt 209 lb (94.8 kg)   SpO2 98%   BMI 29.15 kg/m       Objective:   Physical Exam Vitals and nursing note  reviewed.  Constitutional:      Appearance: Normal appearance. He is obese.  Cardiovascular:     Rate and Rhythm: Normal rate and regular rhythm.     Pulses: Normal pulses.     Heart sounds: Normal heart sounds.  Pulmonary:  Effort: Pulmonary effort is normal.     Breath sounds: Normal breath sounds.  Musculoskeletal:     Right shoulder: No crepitus. Decreased range of motion. Normal strength. Normal pulse.     Left shoulder: No crepitus. Decreased range of motion. Normal strength. Normal pulse.     Comments: He is only able to raise his arms at 180 degrees before he starts to develop pain in both shoulders.  His with flexion and adduction.  He is able to perform back scratch test without much difficulty.  Skin:    General: Skin is warm and dry.  Neurological:     General: No focal deficit present.     Mental Status: He is alert and oriented to person, place, and time.  Psychiatric:        Mood and Affect: Mood normal.        Behavior: Behavior normal.        Thought Content: Thought content normal.        Judgment: Judgment normal.        Assessment & Plan:  1. Chronic right shoulder pain (Primary) - Will do xrays and refer to orthopedics. Concern for rotator cuff injury  - Likely will need advanced imaging  - DG Shoulder Right; Future - AMB referral to orthopedics  2. Chronic left shoulder pain Concern for recurrent rotator cuff injury  - DG Shoulder Left; Future - AMB referral to orthopedics  3. Benign prostatic hyperplasia with nocturia - Will place him back on Flomax. He will let me know if symptoms not improved in the next 2 weeks. If not improving will add Proscar - tamsulosin (FLOMAX) 0.4 MG CAPS capsule; Take 1 capsule (0.4 mg total) by mouth daily.  Dispense: 30 capsule; Refill: 3  Time spent with patient today was 42 minutes which consisted of chart review, discussing BPH and shoulder pain,  work up, treatment, listening, answering questions and  documentation.

## 2023-05-17 ENCOUNTER — Ambulatory Visit: Attending: Adult Health

## 2023-05-17 ENCOUNTER — Other Ambulatory Visit: Payer: Self-pay

## 2023-05-17 DIAGNOSIS — G8929 Other chronic pain: Secondary | ICD-10-CM | POA: Diagnosis not present

## 2023-05-17 DIAGNOSIS — M25512 Pain in left shoulder: Secondary | ICD-10-CM | POA: Diagnosis not present

## 2023-05-17 DIAGNOSIS — M25511 Pain in right shoulder: Secondary | ICD-10-CM | POA: Insufficient documentation

## 2023-05-17 DIAGNOSIS — M6281 Muscle weakness (generalized): Secondary | ICD-10-CM | POA: Diagnosis not present

## 2023-05-17 NOTE — Therapy (Unsigned)
 OUTPATIENT PHYSICAL THERAPY SHOULDER EVALUATION   Patient Name: Scott Carson MRN: [id number] DOB:Feb 27, 1970, 53 y.o., male Today's Date: 05/18/2023  END OF SESSION:  PT End of Session - 05/17/23 1642     Visit Number 1    Number of Visits 12    Date for PT Re-Evaluation 07/17/23    Authorization Type BCBS    PT Start Time 1700    PT Stop Time 1745    PT Time Calculation (min) 45 min    Activity Tolerance Patient tolerated treatment well    Behavior During Therapy WFL for tasks assessed/performed             Past Medical History:  Diagnosis Date   Arthritis    BACK   CVA (cerebral vascular accident) (HCC) 2020   Eczema of hand    Hypertension    Low back pain    Smoker    Past Surgical History:  Procedure Laterality Date   RESECTION DISTAL CLAVICAL Left 08/13/2015   Procedure: RESECTION DISTAL CLAVICAL;  Surgeon: Loreta Ave, MD;  Location: [facility];  Service: Orthopedics;  Laterality: Left;   SHOULDER ARTHROSCOPY WITH ROTATOR CUFF REPAIR AND SUBACROMIAL DECOMPRESSION Left 08/13/2015   Procedure: LEFT SHOULDER ARTHROSCOPY DEBRIDEMENT ACROMIOPLASTY, DISTAL CLAVICAL EXCISION ROTATOR CUFF REPAIR ;  Surgeon: Loreta Ave, MD;  Location: [facility];  Service: Orthopedics;  Laterality: Left;   TONSILLECTOMY     UPPER GASTROINTESTINAL ENDOSCOPY     2012,2013,2015 jacobs    Patient Active Problem List   Diagnosis Date Noted   Dysphagia 02/26/2021   Stenosis of carotid artery 04/23/2020   Dilated aortic root (HCC) 04/23/2020   GERD (gastroesophageal reflux disease) 07/10/2019   OAG (open angle glaucoma) suspect, low risk, bilateral 02/18/2018   Tobacco use disorder 06/27/2014   Hand dermatitis 04/23/2014   Dizziness 01/24/2014   Left rotator cuff tear 12/20/2013   Urinary frequency 05/17/2013   Low back pain 01/07/2011   Essential hypertension 09/15/2006    PCP: Shirline Frees, NP   REFERRING PROVIDER: Shirline Frees, NP    REFERRING DIAG: [phone] (ICD-10-CM) - Chronic right shoulder pain M25.512,G89.29 (ICD-10-CM) - Chronic left shoulder pain  THERAPY DIAG:  Chronic pain of both shoulders  Muscle weakness (generalized)  Rationale for Evaluation and Treatment: Rehabilitation  ONSET DATE: chronic  SUBJECTIVE:                                                                                                                                                                                      SUBJECTIVE STATEMENT: He has a history of chronic bilateral shoulder pain and a left sided rotator  cuff repair in 2017.  In August 2024 he was seen for worsening pain in his left shoulder and had pain with raising his arm over his head and when lifting objects at work.  He denied any numbness or tingling down his arm.  We decided to do a steroid injection to see if this would give him some relief.  He reports today that the steroid injection did not help.  He continues to have loss of range of motion in his left shoulder as well as in his right shoulder and he is "tired of dealing with the pain".  He is ready to see orthopedics. Hand dominance: Right  PERTINENT HISTORY: 1. Chronic right shoulder pain (Primary) - Will do xrays and refer to orthopedics. Concern for rotator cuff injury  - Likely will need advanced imaging  - DG Shoulder Right; Future - AMB referral to orthopedics   2. Chronic left shoulder pain Concern for recurrent rotator cuff injury  - DG Shoulder Left; Future - AMB referral to orthopedics  PAIN:  Are you having pain? Yes: NPRS scale: R 7-8/10, L 7-8/10 Pain location: B shoulders Pain description: ache Aggravating factors: forward and OH reaching, sleep positions Relieving factors: none  PRECAUTIONS: None  RED FLAGS: None   WEIGHT BEARING RESTRICTIONS: No  FALLS:  Has patient fallen in last 6 months? No  OCCUPATION: construction  PLOF: Independent  PATIENT GOALS:To manage my  shoulder pain  NEXT MD VISIT:   OBJECTIVE:  Note: Objective measures were completed at Evaluation unless otherwise noted.  DIAGNOSTIC FINDINGS:  None available  PATIENT SURVEYS:  Quick Dash 17/55  POSTURE: Forward shoulder posture  UPPER EXTREMITY ROM: WFL B  Active ROM Right eval Left eval  Shoulder flexion    Shoulder extension    Shoulder abduction    Shoulder adduction    Shoulder internal rotation    Shoulder external rotation    Elbow flexion    Elbow extension    Wrist flexion    Wrist extension    Wrist ulnar deviation    Wrist radial deviation    Wrist pronation    Wrist supination    (Blank rows = not tested)  UPPER EXTREMITY MMT:  MMT Right eval Left eval  Shoulder flexion    Shoulder extension    Shoulder abduction    Shoulder adduction    Shoulder internal rotation    Shoulder external rotation    Middle trapezius 4 4  Lower trapezius 4 4  Elbow flexion    Elbow extension    Wrist flexion    Wrist extension    Wrist ulnar deviation    Wrist radial deviation    Wrist pronation    Wrist supination    Grip strength (lbs)    (Blank rows = not tested)  SHOULDER SPECIAL TESTS: Impingement tests: Neer impingement test: positive  and Hawkins/Kennedy impingement test: positive R only  Rotator cuff assessment: Hornblower's sign: positive R Biceps assessment: Speed's test: positive L  JOINT MOBILITY TESTING:  N/A  PALPATION:  Unremarkable  TREATMENT DATE:  [facility] Adult PT Treatment:                                                DATE: 05/15/24 Eval and HEP Self Care: Additional minutes spent for educating on updated Therapeutic Home Exercise Program as well as comparing current status to condition at start of care. This included exercises focusing on stretching, strengthening, with focus on eccentric aspects. Long term  goals include an improvement in range of motion, strength, endurance as well as avoiding reinjury. Patient's frequency would include in 1-2 times a day, 3-5 times a week for a duration of 6-12 weeks. Proper technique shown and discussed handout in great detail. All questions were discussed and addressed.      PATIENT EDUCATION: Education details: Discussed eval findings, rehab rationale and POC and patient is in agreement  Person educated: Patient Education method: Explanation Education comprehension: verbalized understanding and needs further education  HOME EXERCISE PROGRAM: Access Code: [email or web address] URL: [email or web address] Date: 05/17/2023 Prepared by: Gustavus Bryant  Exercises - Standing Shoulder Horizontal Abduction with Resistance  - 1 x daily - 3 x weekly - 2 sets - 15 reps - Shoulder External Rotation and Scapular Retraction with Resistance  - 1 x daily - 3 x weekly - 2 sets - 15 reps - Scaption with Resistance  - 1 x daily - 3 x weekly - 2 sets - 15 reps - Doorway Pec Stretch at 120 Degrees Abduction  - 1 x daily - 3 x weekly - 1 sets - 2-3 reps - 30s hold  ASSESSMENT:  CLINICAL IMPRESSION: Patient is a 53 y.o. male who was seen today for physical therapy evaluation and treatment for B shoulder pain R>L with underlying history of L RCR 2017.  Patient presents with functional AROM B, forward shoulder posture and substitution patterns with L shoulder elevation.  Ositive impingement signs in R shoulder but no further RC pathology suspected.  Patient requesting limited sessions for financial reasons.  OBJECTIVE IMPAIRMENTS: decreased activity tolerance, decreased knowledge of condition, decreased strength, impaired perceived functional ability, impaired UE functional use, postural dysfunction, and pain.   ACTIVITY LIMITATIONS: carrying, lifting, sleeping, and reach over head  PERSONAL FACTORS: Age, Fitness, and Past/current experiences are also affecting patient's  functional outcome.   REHAB POTENTIAL: Good  CLINICAL DECISION MAKING: Stable/uncomplicated  EVALUATION COMPLEXITY: Moderate   GOALS: Goals reviewed with patient? No    SHORT TERM GOALS=LONG TERM GOALS: Target date: 06/28/2023    Patient will acknowledge 4/10 worst pain at least once during episode of care   Baseline: 7-8/10 Goal status: INITIAL  2.  Patient will score at least 10/55 on DASH to signify clinically meaningful improvement in functional abilities.   Baseline: 17/55 Goal status: INITIAL  3.  Patient to demonstrate independence in HEP  Baseline: [id number] Goal status: INITIAL   PLAN:  PT FREQUENCY: 2x/week  PT DURATION: 6 weeks  PLANNED INTERVENTIONS: 97164- PT Re-evaluation, 97110-Therapeutic exercises, 97530- Therapeutic activity, 97112- Neuromuscular re-education, 97535- Self Care, 65784- Manual therapy, Patient/Family education, Taping, Dry Needling, and Joint mobilization  PLAN FOR NEXT SESSION: HEP review and update, manual techniques as appropriate, aerobic tasks, ROM and flexibility activities, strengthening and PREs, TPDN, gait and balance training as needed     Hildred Laser, PT 05/18/2023, 10:28 AM

## 2023-05-24 ENCOUNTER — Ambulatory Visit

## 2023-05-25 ENCOUNTER — Other Ambulatory Visit: Payer: Self-pay | Admitting: Gastroenterology

## 2023-05-25 ENCOUNTER — Other Ambulatory Visit: Payer: Self-pay | Admitting: Adult Health

## 2023-05-25 DIAGNOSIS — K2970 Gastritis, unspecified, without bleeding: Secondary | ICD-10-CM

## 2023-05-25 DIAGNOSIS — R1310 Dysphagia, unspecified: Secondary | ICD-10-CM

## 2023-05-25 DIAGNOSIS — K222 Esophageal obstruction: Secondary | ICD-10-CM

## 2023-05-25 DIAGNOSIS — G8929 Other chronic pain: Secondary | ICD-10-CM

## 2023-05-25 DIAGNOSIS — K219 Gastro-esophageal reflux disease without esophagitis: Secondary | ICD-10-CM

## 2023-05-27 ENCOUNTER — Other Ambulatory Visit: Payer: Self-pay | Admitting: Gastroenterology

## 2023-05-27 DIAGNOSIS — R1310 Dysphagia, unspecified: Secondary | ICD-10-CM

## 2023-05-27 DIAGNOSIS — K219 Gastro-esophageal reflux disease without esophagitis: Secondary | ICD-10-CM

## 2023-05-27 DIAGNOSIS — K2970 Gastritis, unspecified, without bleeding: Secondary | ICD-10-CM

## 2023-05-27 DIAGNOSIS — K222 Esophageal obstruction: Secondary | ICD-10-CM

## 2023-05-29 ENCOUNTER — Ambulatory Visit

## 2023-06-01 ENCOUNTER — Telehealth: Payer: Self-pay

## 2023-06-01 NOTE — Telephone Encounter (Signed)
**Note De-identified  [place] Obfuscation** Please advise 

## 2023-06-01 NOTE — Telephone Encounter (Signed)
 Copied from CRM [id number]. Topic: General - Other >> Jun 01, 2023 11:29 AM Florestine Avers wrote: Reason for CRM: Patient called in stating that he saw Denyse Amass previously for issues with his rotator cuff in shoulder. He states that he needs to get a doctor note like a work accommodations on how much he can weight lift at work being that he does [employer]. Patient is requesting a call back at the number on file.

## 2023-06-02 ENCOUNTER — Ambulatory Visit

## 2023-06-02 NOTE — Telephone Encounter (Signed)
 Spoke to pt and he stated he can only lift overhead. Pt stated that he can only lift stuff from over his head up to 10 lbs. However, when he is picking stuff up he cannot lift 10lbs. Pt stated that he really dont know what his weight limit is.

## 2023-06-02 NOTE — Telephone Encounter (Signed)
 Letter written for pt. Pt notified of update.

## 2023-06-04 ENCOUNTER — Other Ambulatory Visit: Payer: Self-pay | Admitting: Gastroenterology

## 2023-06-04 DIAGNOSIS — K219 Gastro-esophageal reflux disease without esophagitis: Secondary | ICD-10-CM

## 2023-06-04 DIAGNOSIS — R1310 Dysphagia, unspecified: Secondary | ICD-10-CM

## 2023-06-04 DIAGNOSIS — K222 Esophageal obstruction: Secondary | ICD-10-CM

## 2023-06-04 DIAGNOSIS — K2970 Gastritis, unspecified, without bleeding: Secondary | ICD-10-CM

## 2023-06-05 NOTE — Telephone Encounter (Signed)
Needs an office appointment

## 2023-06-08 ENCOUNTER — Encounter

## 2023-06-10 ENCOUNTER — Ambulatory Visit
Admission: RE | Admit: 2023-06-10 | Discharge: 2023-06-10 | Disposition: A | Discharge status: Home / Self Care | Source: Ambulatory Visit | Attending: Adult Health | Admitting: Adult Health

## 2023-06-10 DIAGNOSIS — M75122 Complete rotator cuff tear or rupture of left shoulder, not specified as traumatic: Secondary | ICD-10-CM | POA: Diagnosis not present

## 2023-06-10 DIAGNOSIS — G8929 Other chronic pain: Secondary | ICD-10-CM

## 2023-06-10 DIAGNOSIS — M19011 Primary osteoarthritis, right shoulder: Secondary | ICD-10-CM | POA: Diagnosis not present

## 2023-06-10 DIAGNOSIS — M75121 Complete rotator cuff tear or rupture of right shoulder, not specified as traumatic: Secondary | ICD-10-CM | POA: Diagnosis not present

## 2023-06-12 ENCOUNTER — Telehealth: Payer: Self-pay | Admitting: Gastroenterology

## 2023-06-12 DIAGNOSIS — K2970 Gastritis, unspecified, without bleeding: Secondary | ICD-10-CM

## 2023-06-12 DIAGNOSIS — K219 Gastro-esophageal reflux disease without esophagitis: Secondary | ICD-10-CM

## 2023-06-12 DIAGNOSIS — R1310 Dysphagia, unspecified: Secondary | ICD-10-CM

## 2023-06-12 DIAGNOSIS — K222 Esophageal obstruction: Secondary | ICD-10-CM

## 2023-06-12 MED ORDER — OMEPRAZOLE 40 MG PO CPDR: 40.0000 mg | DELAYED_RELEASE_CAPSULE | Freq: Every day | ORAL | 0 refills | Status: DC

## 2023-06-12 NOTE — Telephone Encounter (Signed)
 Scott Carson's PT is calling today to get a refill for omeprazole  sent to [facility] in [place]. He has scheduled an OV for 07/06/2023 and is completely out of medication. Please advise.

## 2023-06-12 NOTE — Telephone Encounter (Signed)
 Must keep scheduled follow up Prilosec  sent to patients requested pharmacy  called patient to inform

## 2023-06-16 ENCOUNTER — Encounter

## 2023-06-19 ENCOUNTER — Telehealth: Payer: Self-pay | Admitting: Adult Health

## 2023-06-19 NOTE — Telephone Encounter (Signed)
 Copied from CRM [id number]. Topic: General - Other >> Jun 19, 2023 11:46 AM Chuck Crater wrote: Reason for CRM: Patient can't find his doctor note that was given over the phone and he is needing a physical one he can pick up. Please call patient when it is ready.

## 2023-06-20 NOTE — Telephone Encounter (Signed)
 Letter placed in filing cabinet front office. Pt notified of update.

## 2023-06-28 ENCOUNTER — Other Ambulatory Visit: Payer: Self-pay

## 2023-06-28 ENCOUNTER — Telehealth: Payer: Self-pay

## 2023-06-28 DIAGNOSIS — S43439D Superior glenoid labrum lesion of unspecified shoulder, subsequent encounter: Secondary | ICD-10-CM

## 2023-06-28 NOTE — Telephone Encounter (Signed)
 Copied from CRM [id number]. Topic: Clinical - Lab/Test Results >> Jun 28, 2023 11:19 AM Dewanda Foots wrote: Reason for CRM: PT States he had an MRI done and would like someone to call him to discuss the results please.  Please call [phone] to discuss, if you get vm you may leave a VM.

## 2023-06-28 NOTE — Telephone Encounter (Signed)
Please result and advise 

## 2023-07-06 ENCOUNTER — Encounter: Payer: Self-pay | Admitting: Gastroenterology

## 2023-07-06 ENCOUNTER — Ambulatory Visit: Admitting: Gastroenterology

## 2023-07-06 VITALS — BP 120/80 | HR 86 | Ht 68.0 in | Wt 205.4 lb

## 2023-07-06 DIAGNOSIS — K219 Gastro-esophageal reflux disease without esophagitis: Secondary | ICD-10-CM

## 2023-07-06 MED ORDER — OMEPRAZOLE 40 MG PO CPDR: 40.0000 mg | DELAYED_RELEASE_CAPSULE | Freq: Every day | ORAL | 4 refills | Status: AC

## 2023-07-06 NOTE — Progress Notes (Signed)
 Chief Complaint:follow-up GERD, refills Primary GI Doctor: (previously Dr. Howard Carson) Dr. Karene Carson   HPI:  This is a pleasant 53 year old male, previously know to Dr. Howard Carson, with PMH of GERD, esophagitis, Schatzki's ring. He had an EGD in 2012, 2013, and 2015.    Patient last seen in the GI office by Scott Carson, [place] on 02/26/2021 for dysphagia at that time he had stopped taking his omeprazole  due to running out of the medication.  The omeprazole  was restarted and an EGD with possible dilatation was scheduled. 03/26/2021 EGD with Dr. Howard Carson Impression: - Mild, non- specific gastritis. Biopsied to check for H. pylori. - Acid related distal esophagitis and minor peptic stricture which I dilated to 18mm today using a TTS balloon. - The examination was otherwise normal. Path: Surgical [P], gastric antrum and gastric body- gastritis - MILD CHRONIC GASTRITIS WITHOUT ACTIVITY - NO INTESTINAL METAPLASIA IDENTIFIED  Interval History   Patient presents for follow-up on GERD and refills on medications. Patient has history of GERD and taking omeprazole  40mg  po daily which controls his symptoms. Patient denies dysphagia. Patient denies nausea, vomiting, or weight loss. Patient denies altered bowel habits, abdominal pain, or rectal bleeding. No new issues.  Wt Readings from Last 3 Encounters:  07/06/23 205 lb 6 oz (93.2 kg)  05/12/23 209 lb (94.8 kg)  01/27/23 201 lb 6.4 oz (91.4 kg)     Past Medical History:  Diagnosis Date   Arthritis    BACK   CVA (cerebral vascular accident) (HCC) 2020   Eczema of hand    Hypertension    Low back pain    Smoker     Past Surgical History:  Procedure Laterality Date   RESECTION DISTAL CLAVICAL Left 08/13/2015   Procedure: RESECTION DISTAL CLAVICAL;  Surgeon: Ferd Householder, MD;  Location: [facility];  Service: Orthopedics;  Laterality: Left;   SHOULDER ARTHROSCOPY WITH ROTATOR CUFF REPAIR AND SUBACROMIAL DECOMPRESSION Left 08/13/2015   Procedure:  LEFT SHOULDER ARTHROSCOPY DEBRIDEMENT ACROMIOPLASTY, DISTAL CLAVICAL EXCISION ROTATOR CUFF REPAIR ;  Surgeon: Ferd Householder, MD;  Location: [facility];  Service: Orthopedics;  Laterality: Left;   TONSILLECTOMY     UPPER GASTROINTESTINAL ENDOSCOPY     2012,2013,2015 jacobs     Current Outpatient Medications  Medication Sig Dispense Refill   amLODipine  (NORVASC ) 10 MG tablet TAKE 1 TABLET BY MOUTH EVERY DAY 90 tablet 3   aspirin EC 81 MG tablet Take 81 mg by mouth daily.     labetalol  (NORMODYNE ) 300 MG tablet TAKE 1 TABLET BY MOUTH TWICE A DAY 180 tablet 0   rosuvastatin  (CRESTOR ) 20 MG tablet TAKE 1 TABLET BY MOUTH DAILY. 90 tablet 3   tamsulosin  (FLOMAX ) 0.4 MG CAPS capsule Take 1 capsule (0.4 mg total) by mouth daily. 30 capsule 3   omeprazole  (PRILOSEC ) 40 MG capsule Take 1 capsule (40 mg total) by mouth daily. Patient has scheduled a follow up 90 capsule 4   No current facility-administered medications for this visit.    Allergies as of 07/06/2023   (No Known Allergies)    Family History  Problem Relation Age of Onset   Hypertension Mother    Glaucoma Father    Diabetes Maternal Grandmother    Stroke Maternal Grandmother    Colon cancer Paternal Grandfather    Other Neg Hx        no family history of colon cancer   Stomach cancer Neg Hx    Esophageal cancer Neg Hx  Prostate cancer Neg Hx    Colon polyps Neg Hx    Rectal cancer Neg Hx     Review of Systems:    Constitutional: No weight loss, fever, chills, weakness or fatigue HEENT: Eyes: No change in vision               Ears, Nose, Throat:  No change in hearing or congestion Skin: No rash or itching Cardiovascular: No chest pain, chest pressure or palpitations   Respiratory: No SOB or cough Gastrointestinal: See HPI and otherwise negative Genitourinary: No dysuria or change in urinary frequency Neurological: No headache, dizziness or syncope Musculoskeletal: No new muscle or joint  pain Hematologic: No bleeding or bruising Psychiatric: No history of depression or anxiety    Physical Exam:  Vital signs: BP 120/80   Pulse 86   Ht 5\' 8"  (1.727 m)   Wt 205 lb 6 oz (93.2 kg)   BMI 31.23 kg/m   Constitutional:  Pleasant male appears to be in NAD, Well developed, Well nourished, alert and cooperative Throat: Oral cavity and pharynx without inflammation, swelling or lesion.  Respiratory: Respirations even and unlabored. Lungs clear to auscultation bilaterally.   No wheezes, crackles, or rhonchi.  Cardiovascular: Normal S1, S2. Regular rate and rhythm. No peripheral edema, cyanosis or pallor.  Gastrointestinal:  Soft, nondistended, nontender. No rebound or guarding. Normal bowel sounds. No appreciable masses or hepatomegaly. Rectal:  Not performed.  Msk:  Symmetrical without gross deformities. Without edema, no deformity or joint abnormality.  Neurologic:  Alert and  oriented x4;  grossly normal neurologically.  Skin:   Dry and intact without significant lesions or rashes. Psychiatric: Oriented to person, place and time. Demonstrates good judgement and reason without abnormal affect or behaviors.  RELEVANT LABS AND IMAGING: CBC    Latest Ref Rng & Units 01/27/2023    7:35 AM 10/07/2021    8:27 AM 07/24/2020    9:20 AM  CBC  WBC 4.0 - 10.5 K/uL 4.7  4.9  5.7   Hemoglobin 13.0 - 17.0 g/dL 27.2  53.6  64.4   Hematocrit 39.0 - 52.0 % 43.1  45.8  41.8   Platelets 150.0 - 400.0 K/uL 265.0  237.0  256.0      CMP     Latest Ref Rng & Units 01/27/2023    7:35 AM 03/28/2022    8:36 AM 10/07/2021    8:27 AM  CMP  Glucose 70 - 99 mg/dL 034   90   BUN 6 - 23 mg/dL 13   12   Creatinine 7.42 - 1.50 mg/dL 5.95  6.38  7.56   Sodium 135 - 145 mEq/L 141   139   Potassium 3.5 - 5.1 mEq/L 3.8   3.6   Chloride 96 - 112 mEq/L 105   102   CO2 19 - 32 mEq/L 29   26   Calcium  8.4 - 10.5 mg/dL 9.3   9.3   Total Protein 6.0 - 8.3 g/dL 7.0   6.9   Total Bilirubin 0.2 - 1.2 mg/dL 0.8    1.2   Alkaline Phos 39 - 117 U/L 65   67   AST 0 - 37 U/L 24   30   ALT 0 - 53 U/L 20   28      Lab Results  Component Value Date   TSH 0.60 01/27/2023   04/16/21 echo- Left ventricular ejection fraction, by estimation, is 60 to 65%.  06/21/19 colonoscopy, recall 10 years  Impression: - Diverticulosis in the entire examined colon. - The examination was otherwise normal on direct and retroflexion views. - No polyps or cancers.  Assessment: Encounter Diagnosis  Name Primary?   Gastroesophageal reflux disease, unspecified whether esophagitis present Yes    53 year old African-American male patient who presents for follow-up on GERD that has responded well to omeprazole  40 mg p.o. daily.  Patient has history of dysphagia s/p esophageal stricture requiring dilatation however since being on the medication he has not had any issues.    Patient up-to-date on colonoscopy recall 4/31.  Plan: -Continue Omeprazole  40 mg po daily-refilled -GERD diet, no late meals -Colon screening surveillance recall 4/31 -Follow in 2 years and prn  Thank you for the courtesy of this consult. Please call me with any questions or concerns.   Deshone Lyssy, FNP-C [facility] 07/06/2023, 3:12 PM  Cc: Nafziger, Cory, NP

## 2023-07-06 NOTE — Patient Instructions (Addendum)
 Continue Omeprazole  40 mg po daily  Follow GERD diet, no late meals Follow up in 2 years and prn  _______________________________________________________  If your blood pressure at your visit was 140/90 or greater, please contact your primary care physician to follow up on this.  _______________________________________________________  If you are age 54 or older, your body mass index should be between 23-30. Your Body mass index is 31.23 kg/m. If this is out of the aforementioned range listed, please consider follow up with your Primary Care Provider.  If you are age 65 or younger, your body mass index should be between 19-25. Your Body mass index is 31.23 kg/m. If this is out of the aformentioned range listed, please consider follow up with your Primary Care Provider.   ________________________________________________________  The [facility] providers would like to encourage you to use MYCHART to communicate with providers for non-urgent requests or questions.  Due to long hold times on the telephone, sending your provider a message by [facility] may be a faster and more efficient way to get a response.  Please allow 48 business hours for a response.  Please remember that this is for non-urgent requests.  _______________________________________________________  Thank you for trusting me with your gastrointestinal care. Deanna May, RNP

## 2023-07-06 NOTE — Progress Notes (Signed)
 Agree with the assessment and plan as outlined by [facility], FNP-C.  Carlitos Bottino, DO, [facility]

## 2023-07-11 ENCOUNTER — Other Ambulatory Visit: Payer: Self-pay | Admitting: Adult Health

## 2023-07-11 ENCOUNTER — Other Ambulatory Visit: Payer: Self-pay | Admitting: Family Medicine

## 2023-07-11 DIAGNOSIS — Z125 Encounter for screening for malignant neoplasm of prostate: Secondary | ICD-10-CM

## 2023-07-11 DIAGNOSIS — N401 Enlarged prostate with lower urinary tract symptoms: Secondary | ICD-10-CM

## 2023-07-12 ENCOUNTER — Encounter: Payer: Self-pay | Admitting: Orthopaedic Surgery

## 2023-07-12 ENCOUNTER — Ambulatory Visit: Admitting: Orthopaedic Surgery

## 2023-07-12 DIAGNOSIS — M75121 Complete rotator cuff tear or rupture of right shoulder, not specified as traumatic: Secondary | ICD-10-CM | POA: Insufficient documentation

## 2023-07-12 DIAGNOSIS — M75122 Complete rotator cuff tear or rupture of left shoulder, not specified as traumatic: Secondary | ICD-10-CM

## 2023-07-12 NOTE — Progress Notes (Addendum)
 Office Visit Note   Patient: Scott Carson           Date of Birth: 04/11/1970           MRN: [id number] Visit Date: 07/12/2023              Requested by: Alto Atta, NP [address] [place],  [place] [address] PCP: Alto Atta, NP   Assessment & Plan: Visit Diagnoses:  1. Nontraumatic complete tear of left rotator cuff   2. Nontraumatic complete tear of right rotator cuff     Plan: Reviewed the MRI scans in detail.  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  Pain level is minimal.  Shoulder function intact for ADLs but has trouble with heavy lifting.  Will go ahead and have patient attempt physical therapy with a goal of strengthening the shoulder.  Did discuss with the possibility of a tendon transfer though not sure how much this will improve his strength.  Not a candidate for reverse based on patient's current age and activity level.  Patient will likely require a change in job given that his function is still good but likely that he will be able to return to heavy lifting again in the future.  Will take him out of work for the next 8 weeks to allow proper physical therapy and rest.  Will see patient back after physical therapy in about 6 weeks and determine next steps  Follow-Up Instructions: No follow-ups on file.   Orders:  Orders Placed This Encounter  Procedures   Ambulatory referral to Physical Therapy   No orders of the defined types were placed in this encounter.   Subjective: Chief Complaint  Patient presents with   Right Shoulder - Pain   Left Shoulder - Pain    Patient is presenting for left and right shoulder pain as well as decreased function.  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  Patient has a history of left shoulder rotator cuff repair in 2017.  Patient states that over the past few months he is notices weakness with increased significantly  especially on the left side.  Patient states that he is unable to lift anything more than 10 pounds on the left side and is usually using his right side to compensate and does feel like his pain is slowly increasing due to the compensation on the right side.  Patient notes that the pain is minimal to the function that bothers him more.  Patient does note some changes in day-to-day activity due to the weakness    Review of Systems  Constitutional: Negative.   HENT: Negative.    Eyes: Negative.   Respiratory: Negative.    Cardiovascular: Negative.   Gastrointestinal: Negative.   Endocrine: Negative.   Genitourinary: Negative.   Skin: Negative.   Allergic/Immunologic: Negative.   Neurological: Negative.   Hematological: Negative.   Psychiatric/Behavioral: Negative.    All other systems reviewed and are negative.    Objective: Vital Signs: There were no vitals taken for this visit.  Physical Exam Vitals and nursing note reviewed.  Constitutional:      Appearance: He is well-developed.  HENT:     Head: Normocephalic and atraumatic.  Eyes:     Pupils: Pupils are equal, round, and reactive to light.  Pulmonary:     Effort: Pulmonary effort is normal.  Abdominal:  Palpations: Abdomen is soft.  Musculoskeletal:        General: Normal range of motion.     Cervical back: Neck supple.  Skin:    General: Skin is warm.  Neurological:     Mental Status: He is alert and oriented to person, place, and time.  Psychiatric:        Behavior: Behavior normal.        Thought Content: Thought content normal.        Judgment: Judgment normal.    Ortho Exam Inspection of the bilateral shoulders reveals no gross abnormalities.  There is no tenderness to palpation over the humeral head bilaterally.  There is noted full active and passive range of motion on the left and right side.  There is noted weakness on the right side with external rotation and abduction against resistance.  There is  more pronounced weakness on the left side with external rotation and abduction.  Empty can is positive.  PMFS History: Patient Active Problem List   Diagnosis Date Noted   Nontraumatic complete tear of right rotator cuff 07/12/2023   Dysphagia 02/26/2021   Stenosis of carotid artery 04/23/2020   Dilated aortic root (HCC) 04/23/2020   GERD (gastroesophageal reflux disease) 07/10/2019   OAG (open angle glaucoma) suspect, low risk, bilateral 02/18/2018   Tobacco use disorder 06/27/2014   Hand dermatitis 04/23/2014   Dizziness 01/24/2014   Left rotator cuff tear 12/20/2013   Urinary frequency 05/17/2013   Low back pain 01/07/2011   Essential hypertension 09/15/2006   Past Medical History:  Diagnosis Date   Arthritis    BACK   CVA (cerebral vascular accident) (HCC) 2020   Eczema of hand    Hypertension    Low back pain    Smoker     Family History  Problem Relation Age of Onset   Hypertension Mother    Glaucoma Father    Diabetes Maternal Grandmother    Stroke Maternal Grandmother    Colon cancer Paternal Grandfather    Other Neg Hx        no family history of colon cancer   Stomach cancer Neg Hx    Esophageal cancer Neg Hx    Prostate cancer Neg Hx    Colon polyps Neg Hx    Rectal cancer Neg Hx     Past Surgical History:  Procedure Laterality Date   RESECTION DISTAL CLAVICAL Left 08/13/2015   Procedure: RESECTION DISTAL CLAVICAL;  Surgeon: Ferd Householder, MD;  Location: [facility];  Service: Orthopedics;  Laterality: Left;   SHOULDER ARTHROSCOPY WITH ROTATOR CUFF REPAIR AND SUBACROMIAL DECOMPRESSION Left 08/13/2015   Procedure: LEFT SHOULDER ARTHROSCOPY DEBRIDEMENT ACROMIOPLASTY, DISTAL CLAVICAL EXCISION ROTATOR CUFF REPAIR ;  Surgeon: Ferd Householder, MD;  Location: [facility];  Service: Orthopedics;  Laterality: Left;   TONSILLECTOMY     UPPER GASTROINTESTINAL ENDOSCOPY     2012,2013,2015 [email or web address]    Social History   Occupational  History   Occupation: Agricultural consultant    Comment: Advice worker  Tobacco Use   Smoking status: Former    Current packs/day: 0.50    Average packs/day: 0.5 packs/day for 2.1 years (1.1 ttl pk-yrs)    Types: Cigars, Cigarettes    Start date: 04/30/2012    Quit date: 07/01/2013   Smokeless tobacco: Never  Vaping Use   Vaping status: Never Used  Substance and Sexual Activity   Alcohol use: Not Currently    Comment: occasionaly  Drug use: Yes    Types: Marijuana    Comment: smoked 06/20/19   Sexual activity: Not on file

## 2023-07-18 ENCOUNTER — Ambulatory Visit (INDEPENDENT_AMBULATORY_CARE_PROVIDER_SITE_OTHER): Admitting: Physical Therapy

## 2023-07-18 ENCOUNTER — Other Ambulatory Visit: Payer: Self-pay

## 2023-07-18 DIAGNOSIS — M25512 Pain in left shoulder: Secondary | ICD-10-CM

## 2023-07-18 DIAGNOSIS — R293 Abnormal posture: Secondary | ICD-10-CM | POA: Diagnosis not present

## 2023-07-18 DIAGNOSIS — G8929 Other chronic pain: Secondary | ICD-10-CM | POA: Diagnosis not present

## 2023-07-18 DIAGNOSIS — M6281 Muscle weakness (generalized): Secondary | ICD-10-CM | POA: Diagnosis not present

## 2023-07-18 DIAGNOSIS — M25511 Pain in right shoulder: Secondary | ICD-10-CM | POA: Diagnosis not present

## 2023-07-18 NOTE — Therapy (Signed)
 OUTPATIENT PHYSICAL THERAPY SHOULDER EVALUATION   Patient Name: Scott Carson MRN: [id number] DOB:[birth year], 53 y.o., male Today's Date: 07/18/2023  END OF SESSION:  PT End of Session - 07/18/23 1623     Visit Number 1    Number of Visits 16    Date for PT Re-Evaluation 09/12/23    Authorization Type BCBS    PT Start Time 1520    PT Stop Time 1600    PT Time Calculation (min) 40 min             Past Medical History:  Diagnosis Date   Arthritis    BACK   CVA (cerebral vascular accident) (HCC) 2020   Eczema of hand    Hypertension    Low back pain    Smoker    Past Surgical History:  Procedure Laterality Date   RESECTION DISTAL CLAVICAL Left 08/13/2015   Procedure: RESECTION DISTAL CLAVICAL;  Surgeon: Ferd Householder, MD;  Location: [facility];  Service: Orthopedics;  Laterality: Left;   SHOULDER ARTHROSCOPY WITH ROTATOR CUFF REPAIR AND SUBACROMIAL DECOMPRESSION Left 08/13/2015   Procedure: LEFT SHOULDER ARTHROSCOPY DEBRIDEMENT ACROMIOPLASTY, DISTAL CLAVICAL EXCISION ROTATOR CUFF REPAIR ;  Surgeon: Ferd Householder, MD;  Location: [facility];  Service: Orthopedics;  Laterality: Left;   TONSILLECTOMY     UPPER GASTROINTESTINAL ENDOSCOPY     2012,2013,2015 jac[facility]    Patient Active Problem List   Diagnosis Date Noted   Nontraumatic complete tear of right rotator cuff 07/12/2023   Dysphagia 02/26/2021   Stenosis of carotid artery 04/23/2020   Dilated aortic root (HCC) 04/23/2020   GERD (gastroesophageal reflux disease) 07/10/2019   OAG (open angle glaucoma) suspect, low risk, bilateral 02/18/2018   Tobacco use disorder 06/27/2014   Hand dermatitis 04/23/2014   Dizziness 01/24/2014   Left rotator cuff tear 12/20/2013   Urinary frequency 05/17/2013   Low back pain 01/07/2011   Essential hypertension 09/15/2006    PCP: Alto Atta, NP  REFERRING PROVIDER: Jude Norton, MD  REFERRING DIAG: [phone] (ICD-10-CM) - Nontraumatic complete  tear of left rotator cuff   THERAPY DIAG:  Chronic pain of both shoulders  Muscle weakness (generalized)  Abnormal posture  Rationale for Evaluation and Treatment: Rehabilitation  ONSET DATE: at least 10 years  SUBJECTIVE:                                                                                                                                                                                      SUBJECTIVE STATEMENT: Pt states he had a rotator cuff repair on the left before and it retore. Pt reports he has to lift heavy  at work -- works in [employer]. Has to lift overhead which is the most aggravating. Will carry at least 30 to 70 lbs. Pt states L shoulder hurts the most. Pt was recommended to try and do strengthening since he is too young for a full replacement.  Hand dominance: Right  PERTINENT HISTORY: L Rotator cuff repair ~5 years ago  PAIN:  Are you having pain? Yes: NPRS scale: at rest 0, at worst 9/10 Pain location: Front of L shoulder Pain description: Numbness while sleeping  Aggravating factors: Overhead lifting, tenderness to the touch Relieving factors: Rest  PRECAUTIONS: None  RED FLAGS: None   WEIGHT BEARING RESTRICTIONS: No  FALLS:  Has patient fallen in last 6 months? 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  DIAGNOSTIC FINDINGS:  L shoulder MRI 06/10/23 IMPRESSION: 1. Prior rotator cuff repair with postsurgical changes in the humeral head. Complete full-thickness, full width tear of the supraspinatus and infraspinatus tendons with 3.8 cm of retraction.  R shoulder MRI 06/10/23 IMPRESSION: 1. Complete full-thickness, full width tear of the  supraspinatus and infraspinatus tendons with 5 cm of retraction. 2. Mild tendinosis of the subscapularis tendon. 3. Mild tendinosis of the intra-articular portion of the long head of the biceps tendon. 4. Moderate arthropathy of the acromioclavicular joint with severe marrow edema on either side of the joint.  PATIENT SURVEYS:  QuickDASH Score: 4.5 / 100 = 4.5 % The Patient-Specific Functional Scale  Initial:  I am going to ask you to identify up to 3 important activities that you are unable to do or are having difficulty with as a result of this problem.  Today are there any activities that you are unable to do or having difficulty with because of this?  (Patient shown scale and patient rated each activity)  Follow up: When you first came in you had difficulty performing these activities.  Today do you still have difficulty?  Patient-Specific activity scoring scheme (Point to one number):  0 1 2 3 4 5 6 7 8 9  10 Unable                                                                                                          Able to perform To perform  activity at the same Activity         Level as before                                                                                                                       Injury or problem Activity Initial (eval): Follow up:  Lifting overhead 8          COGNITION: Overall cognitive status: Within functional limits for tasks assessed     SENSATION: WFL  POSTURE: UT elevation L>R Anteriorly rotated glenohumeral head/rounded shoulders  UPPER EXTREMITY ROM: PROM WNL  Active ROM Right eval Left eval  Shoulder flexion 130* 115*  Shoulder extension 55 55  Shoulder abduction 160 135*  Shoulder adduction    Shoulder internal rotation To sacrum To sacrum  Shoulder external rotation 15 -10  Elbow flexion    Elbow extension    Wrist  flexion    Wrist extension    Wrist ulnar deviation    Wrist radial deviation    Wrist pronation    Wrist supination    (Blank rows = not tested, * = pain)  UPPER EXTREMITY MMT:  MMT Right eval Left eval  Shoulder flexion 3+ thumbs up and palm down 3+ thumbs up and palm down  Shoulder extension prone 4 3+  Shoulder abduction 3 palm down 3 palm down  Shoulder adduction    Shoulder internal rotation 5 5  Shoulder external rotation 3- 2+  Middle trapezius prone 4 3  Lower trapezius prone  Unable without pain  Elbow flexion    Elbow extension    Wrist flexion    Wrist extension    Wrist ulnar deviation    Wrist radial deviation    Wrist pronation    Wrist supination    Grip strength (lbs)    (Blank rows = not tested)  SHOULDER SPECIAL TESTS: Did not perform  JOINT MOBILITY TESTING:  GH joint mobility tight with anterior mobs and especially into ER  PALPATION:  TTP L pec                                                                                                                             TREATMENT DATE: 07/18/23 See HEP below Attempted RTC isometrics but painful so focused on scapular strengthening   PATIENT EDUCATION: Education details: Exam findings, POC, initial HEP Person educated: Patient Education method: Explanation, Demonstration, and Handouts Education comprehension: verbalized understanding, returned demonstration,  and needs further education  HOME EXERCISE PROGRAM: Access Code: [email or web address] URL: [email or web address] Date: 07/18/2023 Prepared by: Laykin Rainone April Erman Hayward  Exercises - Doorway Pec Stretch at 60 Elevation  - 1 x daily - 7 x weekly - 2 sets - 30 sec hold - Standing Shoulder Row with Anchored Resistance  - 1 x daily - 7 x weekly - 2 sets - 10 reps - Shoulder extension with resistance - Neutral  - 1 x daily - 7 x weekly - 2 sets - 10 reps  ASSESSMENT:  CLINICAL IMPRESSION: Patient is a 53 y.o. M who was seen today for  physical therapy evaluation and treatment for bilat shoulder weakness (L worse than R) from bilat rotator cuff tears. PMH significant for prior L rotator cuff repair. Imaging demos bilat full thickness tears of supraspinatus and infraspinatus. Assessment is significant for increased pec and upper trap tightness/over compensation with L>R scapular muscle weakness. Bilat weakness in deltoids as well resulting in decreased ability to perform lifting tasks.  Pt will benefit from PT for gross shoulder strengthening to compensate for his multiple rotator cuff tears as ortho has recommended no surgery at this time.   OBJECTIVE IMPAIRMENTS: decreased activity tolerance, decreased endurance, decreased mobility, decreased ROM, decreased strength, increased fascial restrictions, increased muscle spasms, impaired UE functional use, improper body mechanics, postural dysfunction, and pain.   ACTIVITY LIMITATIONS: carrying, lifting, sleeping, dressing, and reach over head  PARTICIPATION LIMITATIONS: cleaning, community activity, occupation, and yard work  PERSONAL FACTORS: Age, Fitness, Past/current experiences, Profession, and Time since onset of injury/illness/exacerbation are also affecting patient's functional outcome.   REHAB POTENTIAL: Good  CLINICAL DECISION MAKING: Evolving/moderate complexity  EVALUATION COMPLEXITY: Moderate   GOALS: Goals reviewed with patient? Yes  SHORT TERM GOALS: Target date: 08/15/2023   Pt will be ind with initial HEP Baseline: Goal status: INITIAL  2.  Pt will have improved shoulder elevation to >/=140 deg on L Baseline:  Goal status: INITIAL   LONG TERM GOALS: Target date: 09/12/2023   Pt will be ind with management and progression of HEP Baseline:  Goal status: INITIAL  2.  Pt will be able to lift at least 10# above shoulder height with >/=50% less pain than baseline Baseline:  Goal status: INITIAL  3.  Pt will have improved PSFS to a 10 to demo  MCID Baseline:  Goal status: INITIAL  4.  Pt will demo at least 4/5 bilat shoulder and scapular strength for improved lifting mechanics Baseline:  Goal status: INITIAL  5.  Pt will report >/=50% improvement in overall pain with activities Baseline:  Goal status: INITIAL   PLAN:  PT FREQUENCY: 2x/week  PT DURATION: 8 weeks  PLANNED INTERVENTIONS: 97164- PT Re-evaluation, 97750- Physical Performance Testing, 97110-Therapeutic exercises, 97530- Therapeutic activity, [id number]- Neuromuscular re-education, 97535- Self Care, [id number]- Manual therapy, [id number]- Electrical stimulation (unattended), 97016- Vasopneumatic device, 97035- Ultrasound, 47829- Ionotophoresis 4mg /ml Dexamethasone , Patient/Family education, Taping, Dry Needling, Spinal mobilization, Cryotherapy, and Moist heat  PLAN FOR NEXT SESSION: Assess response to HEP. Strengthen scapular muscles. Initiate rotator cuff/deltoid strengthening if pt tolerates. AAROM shoulder mobility.    Rashena Dowling April Ma L Surah Pelley, PT, DPT 07/18/2023, 4:23 PM

## 2023-07-19 ENCOUNTER — Ambulatory Visit (INDEPENDENT_AMBULATORY_CARE_PROVIDER_SITE_OTHER): Admitting: Rehabilitative and Restorative Service Providers"

## 2023-07-19 ENCOUNTER — Encounter: Payer: Self-pay | Admitting: Rehabilitative and Restorative Service Providers"

## 2023-07-19 DIAGNOSIS — M6281 Muscle weakness (generalized): Secondary | ICD-10-CM

## 2023-07-19 DIAGNOSIS — R293 Abnormal posture: Secondary | ICD-10-CM | POA: Diagnosis not present

## 2023-07-19 DIAGNOSIS — G8929 Other chronic pain: Secondary | ICD-10-CM

## 2023-07-19 DIAGNOSIS — M25511 Pain in right shoulder: Secondary | ICD-10-CM

## 2023-07-19 DIAGNOSIS — M25512 Pain in left shoulder: Secondary | ICD-10-CM

## 2023-07-19 NOTE — Therapy (Signed)
 OUTPATIENT PHYSICAL THERAPY SHOULDER TREATMENT   Patient Name: Scott Carson MRN: [id number] DOB:October 01, 1970, 53 y.o., male Today's Date: 07/19/2023  END OF SESSION:  PT End of Session - 07/19/23 1101     Visit Number 2    Number of Visits 16    Date for PT Re-Evaluation 09/12/23    Authorization Type [employer]    PT Start Time 1100    PT Stop Time 1145    PT Time Calculation (min) 45 min    Activity Tolerance Patient tolerated treatment well;No increased pain    Behavior During Therapy WFL for tasks assessed/performed              Past Medical History:  Diagnosis Date   Arthritis    BACK   CVA (cerebral vascular accident) (HCC) 2020   Eczema of hand    Hypertension    Low back pain    Smoker    Past Surgical History:  Procedure Laterality Date   RESECTION DISTAL CLAVICAL Left 08/13/2015   Procedure: RESECTION DISTAL CLAVICAL;  Surgeon: Ferd Householder, MD;  Location: [facility];  Service: Orthopedics;  Laterality: Left;   SHOULDER ARTHROSCOPY WITH ROTATOR CUFF REPAIR AND SUBACROMIAL DECOMPRESSION Left 08/13/2015   Procedure: LEFT SHOULDER ARTHROSCOPY DEBRIDEMENT ACROMIOPLASTY, DISTAL CLAVICAL EXCISION ROTATOR CUFF REPAIR ;  Surgeon: Ferd Householder, MD;  Location: [facility];  Service: Orthopedics;  Laterality: Left;   TONSILLECTOMY     UPPER GASTROINTESTINAL ENDOSCOPY     2012,2013,2015 [email or web address]    Patient Active Problem List   Diagnosis Date Noted   Nontraumatic complete tear of right rotator cuff 07/12/2023   Dysphagia 02/26/2021   Stenosis of carotid artery 04/23/2020   Dilated aortic root (HCC) 04/23/2020   GERD (gastroesophageal reflux disease) 07/10/2019   OAG (open angle glaucoma) suspect, low risk, bilateral 02/18/2018   Tobacco use disorder 06/27/2014   Hand dermatitis 04/23/2014   Dizziness 01/24/2014   Left rotator cuff tear 12/20/2013   Urinary frequency 05/17/2013   Low back pain 01/07/2011   Essential hypertension  09/15/2006    PCP: Alto Atta, NP  REFERRING PROVIDER: Jude Norton, MD  REFERRING DIAG: [phone] (ICD-10-CM) - Nontraumatic complete tear of left rotator cuff   THERAPY DIAG:  Chronic pain of both shoulders  Muscle weakness (generalized)  Abnormal posture  Rationale for Evaluation and Treatment: Rehabilitation  ONSET DATE: at least 10 years  SUBJECTIVE:                                                                                                                                                                                      SUBJECTIVE STATEMENT:  Scott Carson notes good early HEP compliance.  He has been told he is not a surgical candidate and scapular and rotator cuff strengthening is the emphasis of his HEP.  Pt states he had a rotator cuff repair on the left before and it retore. Pt reports he has to lift heavy at work -- works in [employer]. Has to lift overhead which is the most aggravating. Will carry at least 30 to 70 lbs. Pt states L shoulder hurts the most. Pt was recommended to try and do strengthening since he is too young for a full replacement.  Hand dominance: Right  PERTINENT HISTORY: L Rotator cuff repair ~5 years ago  PAIN:  Are you having pain? Yes: NPRS scale: 0-9/10 this week Pain location: Front of Lt shoulder Pain description: Numbness while sleeping  Aggravating factors: Overhead lifting, tenderness to the touch Relieving factors: Rest  PRECAUTIONS: None  RED FLAGS: None   WEIGHT BEARING RESTRICTIONS: No  FALLS:  Has patient fallen in last 6 months? 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  DIAGNOSTIC FINDINGS:  L shoulder MRI 06/10/23  IMPRESSION: 1. Prior rotator cuff repair with postsurgical changes in the humeral head. Complete full-thickness, full width tear of the supraspinatus and infraspinatus tendons with 3.8 cm of retraction.  R shoulder MRI 06/10/23 IMPRESSION: 1. Complete full-thickness, full width tear of the supraspinatus and infraspinatus tendons with 5 cm of retraction. 2. Mild tendinosis of the subscapularis tendon. 3. Mild tendinosis of the intra-articular portion of the long head of the biceps tendon. 4. Moderate arthropathy of the acromioclavicular joint with severe marrow edema on either side of the joint.  PATIENT SURVEYS:  QuickDASH Score: 4.5 / 100 = 4.5 % The Patient-Specific Functional Scale  Initial:  I am going to ask you to identify up to 3 important activities that you are unable to do or are having difficulty with as a result of this problem.  Today are there any activities that you are unable to do or having difficulty with because of this?  (Patient shown scale and patient rated each activity)  Follow up: When you first came in you had difficulty performing these activities.  Today do you still have difficulty?  Patient-Specific activity scoring scheme (Point to one number):  0 1 2 3 4 5 6 7 8 9  10 Unable                                                                                                          Able to perform To perform  activity at the same Activity         Level as before                                                                                                                       Injury or problem Activity Initial (eval): Follow up:  Lifting overhead 8          COGNITION: Overall cognitive status: Within functional limits for tasks assessed     SENSATION: WFL  POSTURE: UT elevation L>R Anteriorly rotated glenohumeral head/rounded shoulders  UPPER  EXTREMITY ROM: PROM WNL  Active ROM Right eval Left eval Left/Right 07/19/2023  Shoulder flexion 130* 115* 140/165  Shoulder extension 55 55   Shoulder abduction 160 135*   Shoulder horizontal adduction   30/35  Shoulder internal rotation To sacrum To sacrum 80/75  Shoulder external rotation 15 -10 60/80  Elbow flexion     Elbow extension     Wrist flexion     Wrist extension     Wrist ulnar deviation     Wrist radial deviation     Wrist pronation     Wrist supination     (Blank rows = not tested, * = pain)  UPPER EXTREMITY MMT:  MMT Right eval Left eval Left/Right 07/19/2023  Shoulder flexion 3+ thumbs up and palm down 3+ thumbs up and palm down   Shoulder extension prone 4 3+   Shoulder abduction 3 palm down 3 palm down   Shoulder adduction     Shoulder internal rotation 5 5 28.5 pounds/21.1 pounds  Shoulder external rotation 3- 2+ 5.8 pounds/6.5 pounds  Middle trapezius prone 4 3   Lower trapezius prone  Unable without pain   Elbow flexion     Elbow extension     Wrist flexion     Wrist extension     Wrist ulnar deviation     Wrist radial deviation     Wrist pronation     Wrist supination     Grip strength (lbs)     (Blank rows = not tested)  SHOULDER SPECIAL TESTS: Did not perform  JOINT MOBILITY TESTING:  GH joint mobility tight with anterior mobs and especially into ER  PALPATION:  TTP L pec                                                                                                                    TREATMENT DATE:  07/19/2023 Shoulder blade pinches/scapular retraction 10 x 5  seconds Shoulder Thera-Band ER Red 10 x 3 seconds  Shoulder Thera-Band IR Red 10 x 3 seconds  Functional Activities: Supine arm raises/Scapular protraction 20 x for 3 seconds with 6# (use 4# next time) for reaching and avoiding impingement Pull to chest/Rows/Scapular retraction 20 x Blue Thera-Band for pulling at work Shoulder extension Thera-Band Red 10 x palms up for  pulling at work Check range and strength objectively   07/18/23 See HEP below Attempted RTC isometrics but painful so focused on scapular strengthening   PATIENT EDUCATION: Education details: Exam findings, POC, initial HEP Person educated: Patient Education method: Explanation, Demonstration, and Handouts Education comprehension: verbalized understanding, returned demonstration, and needs further education  HOME EXERCISE PROGRAM: Access Code: [email or web address] URL: [email or web address] Date: 07/19/2023 Prepared by: Terral Ferrari  Exercises - Doorway Pec Stretch at 60 Elevation  - 1 x daily - 7 x weekly - 2 sets - 30 sec hold - Standing Shoulder Row with Anchored Resistance  - 1-2 x daily - 7 x weekly - 2 sets - 10 reps - Shoulder extension with resistance - Neutral  - 1-2 x daily - 7 x weekly - 2 sets - 10 reps - Supine Scapular Protraction in Flexion with Dumbbells  - 2 x daily - 7 x weekly - 1 sets - 20 reps - 3 seconds hold - Standing Scapular Retraction  - 5 x daily - 7 x weekly - 1 sets - 5 reps - 5 second hold - Shoulder External Rotation with Anchored Resistance  - 2 x daily - 7 x weekly - 2 sets - 10 reps - 3 hold - Shoulder Internal Rotation with Resistance  - 2 x daily - 7 x weekly - 2 sets - 10 reps - 3 hold  ASSESSMENT:  CLINICAL IMPRESSION: Kastiel did a good job of his recall and execution of his exercises from his initial visit.  Will check his numbers and he is already showing progress with his active range of motion.  He also got objective strength measurements with a hand-held dynamometer to establish a baseline.  Scott Carson and I discussed that with the degree of retraction of his tears, he is likely not a rotator cuff repair candidate and will need to work very diligently on his scapular rotator cuff strength below listed long-term goals.  Scott Carson had a great attitude and worked extremely hard today and will benefit from the recommended plan of care.  Patient is  a 53 y.o. M who was seen today for physical therapy evaluation and treatment for bilat shoulder weakness (L worse than R) from bilat rotator cuff tears. PMH significant for prior L rotator cuff repair. Imaging demos bilat full thickness tears of supraspinatus and infraspinatus. Assessment is significant for increased pec and upper trap tightness/over compensation with L>R scapular muscle weakness. Bilat weakness in deltoids as well resulting in decreased ability to perform lifting tasks.  Pt will benefit from PT for gross shoulder strengthening to compensate for his multiple rotator cuff tears as ortho has recommended no surgery at this time.   OBJECTIVE IMPAIRMENTS: decreased activity tolerance, decreased endurance, decreased mobility, decreased ROM, decreased strength, increased fascial restrictions, increased muscle spasms, impaired UE functional use, improper body mechanics, postural dysfunction, and pain.   ACTIVITY LIMITATIONS: carrying, lifting, sleeping, dressing, and reach over head  PARTICIPATION LIMITATIONS: cleaning, community activity, occupation, and yard work  PERSONAL FACTORS: Age, Fitness, Past/current experiences, Profession, and Time since onset of injury/illness/exacerbation are also affecting patient's functional outcome.   REHAB POTENTIAL: Good  CLINICAL DECISION MAKING: Evolving/moderate complexity  EVALUATION COMPLEXITY: Moderate   GOALS: Goals reviewed with patient? Yes  SHORT TERM GOALS: Target date: 08/15/2023   Pt will be ind with initial HEP Baseline: Goal status: Ongoing 07/19/2023  2.  Pt will have improved shoulder elevation to >/=140 deg on L Baseline:  Goal status: Met 07/19/2023   LONG TERM GOALS: Target date: 09/12/2023   Pt will be ind with management and progression of HEP Baseline:  Goal status: INITIAL  2.  Pt will be able to lift at least 10# above shoulder height with >/=50% less pain than baseline Baseline:  Goal status: INITIAL  3.   Pt will have improved PSFS to a 10 to demo MCID Baseline:  Goal status: INITIAL  4.  Pt will demo at least 4/5 bilat shoulder and scapular strength for improved lifting mechanics Baseline:  Goal status: INITIAL  5.  Pt will report >/=50% improvement in overall pain with activities Baseline:  Goal status: INITIAL   PLAN:  PT FREQUENCY: 2x/week  PT DURATION: 8 weeks  PLANNED INTERVENTIONS: 97164- PT Re-evaluation, 97750- Physical Performance Testing, 97110-Therapeutic exercises, 97530- Therapeutic activity, [id number]- Neuromuscular re-education, 97535- Self Care, 47829- Manual therapy, [id number]- Electrical stimulation (unattended), 97016- Vasopneumatic device, [id number]- Ultrasound, 56213- Ionotophoresis 4mg /ml Dexamethasone , Patient/Family education, Taping, Dry Needling, Spinal mobilization, Cryotherapy, and Moist heat  PLAN FOR NEXT SESSION: Strengthen scapular and remaining intact rotator cuff muscles.   Joli Neas, PT, MPT 07/19/2023, 3:00 PM

## 2023-07-31 ENCOUNTER — Other Ambulatory Visit: Payer: Self-pay | Admitting: Adult Health

## 2023-08-01 ENCOUNTER — Ambulatory Visit: Admitting: Physical Therapy

## 2023-08-01 ENCOUNTER — Encounter: Payer: Self-pay | Admitting: Physical Therapy

## 2023-08-01 DIAGNOSIS — M6281 Muscle weakness (generalized): Secondary | ICD-10-CM | POA: Diagnosis not present

## 2023-08-01 DIAGNOSIS — G8929 Other chronic pain: Secondary | ICD-10-CM

## 2023-08-01 DIAGNOSIS — R293 Abnormal posture: Secondary | ICD-10-CM | POA: Diagnosis not present

## 2023-08-01 DIAGNOSIS — M25511 Pain in right shoulder: Secondary | ICD-10-CM

## 2023-08-01 DIAGNOSIS — M25512 Pain in left shoulder: Secondary | ICD-10-CM

## 2023-08-01 NOTE — Therapy (Signed)
 OUTPATIENT PHYSICAL THERAPY SHOULDER TREATMENT   Patient Name: Scott Carson MRN: [id number] DOB:August 10, 1970, 53 y.o., male Today's Date: 08/01/2023  END OF SESSION:  PT End of Session - 08/01/23 0918     Visit Number 3    Number of Visits 16    Date for PT Re-Evaluation 09/12/23    Authorization Type [employer]    PT Start Time 0850    PT Stop Time 0928    PT Time Calculation (min) 38 min    Activity Tolerance Patient tolerated treatment well;No increased pain    Behavior During Therapy WFL for tasks assessed/performed               Past Medical History:  Diagnosis Date   Arthritis    BACK   CVA (cerebral vascular accident) (HCC) 2020   Eczema of hand    Hypertension    Low back pain    Smoker    Past Surgical History:  Procedure Laterality Date   RESECTION DISTAL CLAVICAL Left 08/13/2015   Procedure: RESECTION DISTAL CLAVICAL;  Surgeon: Ferd Householder, MD;  Location: [facility];  Service: Orthopedics;  Laterality: Left;   SHOULDER ARTHROSCOPY WITH ROTATOR CUFF REPAIR AND SUBACROMIAL DECOMPRESSION Left 08/13/2015   Procedure: LEFT SHOULDER ARTHROSCOPY DEBRIDEMENT ACROMIOPLASTY, DISTAL CLAVICAL EXCISION ROTATOR CUFF REPAIR ;  Surgeon: Ferd Householder, MD;  Location: [facility];  Service: Orthopedics;  Laterality: Left;   TONSILLECTOMY     UPPER GASTROINTESTINAL ENDOSCOPY     2012,2013,2015 [email or web address]    Patient Active Problem List   Diagnosis Date Noted   Nontraumatic complete tear of right rotator cuff 07/12/2023   Dysphagia 02/26/2021   Stenosis of carotid artery 04/23/2020   Dilated aortic root (HCC) 04/23/2020   GERD (gastroesophageal reflux disease) 07/10/2019   OAG (open angle glaucoma) suspect, low risk, bilateral 02/18/2018   Tobacco use disorder 06/27/2014   Hand dermatitis 04/23/2014   Dizziness 01/24/2014   Left rotator cuff tear 12/20/2013   Urinary frequency 05/17/2013   Low back pain 01/07/2011   Essential hypertension  09/15/2006    PCP: Alto Atta, NP  REFERRING PROVIDER: Jude Norton, MD  REFERRING DIAG: [phone] (ICD-10-CM) - Nontraumatic complete tear of left rotator cuff   THERAPY DIAG:  Chronic pain of both shoulders  Muscle weakness (generalized)  Abnormal posture  Rationale for Evaluation and Treatment: Rehabilitation  ONSET DATE: at least 10 years  SUBJECTIVE:                                                                                                                                                                                      SUBJECTIVE  STATEMENT: Scott Carson arriving today reporting feeling a little better, but still reporting 3/10 and at it's worse 9/10 where he couldn't lift his left arm at all.   Pt states he had a rotator cuff repair on the left before and it retore. Pt reports he has to lift heavy at work -- works in [employer]. Has to lift overhead which is the most aggravating. Will carry at least 30 to 70 lbs. Pt states L shoulder hurts the most. Pt was recommended to try and do strengthening since he is too young for a full replacement.  Hand dominance: Right  PERTINENT HISTORY: L Rotator cuff repair ~5 years ago  PAIN:  Are you having pain? Yes: NPRS scale: 3/10 upon arrival, up to 9/10 over the weekend Pain location: Front of Lt shoulder Pain description: Numbness while sleeping  Aggravating factors: Overhead lifting, tenderness to the touch Relieving factors: Rest  PRECAUTIONS: None  RED FLAGS: None   WEIGHT BEARING RESTRICTIONS: No  FALLS:  Has patient fallen in last 6 months? 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  DIAGNOSTIC FINDINGS:  L  shoulder MRI 06/10/23 IMPRESSION: 1. Prior rotator cuff repair with postsurgical changes in the humeral head. Complete full-thickness, full width tear of the supraspinatus and infraspinatus tendons with 3.8 cm of retraction.  R shoulder MRI 06/10/23 IMPRESSION: 1. Complete full-thickness, full width tear of the supraspinatus and infraspinatus tendons with 5 cm of retraction. 2. Mild tendinosis of the subscapularis tendon. 3. Mild tendinosis of the intra-articular portion of the long head of the biceps tendon. 4. Moderate arthropathy of the acromioclavicular joint with severe marrow edema on either side of the joint.  PATIENT SURVEYS:  QuickDASH Score: 4.5 / 100 = 4.5 % The Patient-Specific Functional Scale  Initial:  I am going to ask you to identify up to 3 important activities that you are unable to do or are having difficulty with as a result of this problem.  Today are there any activities that you are unable to do or having difficulty with because of this?  (Patient shown scale and patient rated each activity)  Follow up: When you first came in you had difficulty performing these activities.  Today do you still have difficulty?  Patient-Specific activity scoring scheme (Point to one number):  0 1 2 3 4 5 6 7 8 9  10 Unable                                                                                                          Able to perform To perform  activity at the same Activity         Level as before                                                                                                                       Injury or problem Activity Initial (eval): Follow up:  Lifting overhead 8          COGNITION: Overall cognitive status: Within functional limits for tasks assessed     SENSATION: WFL  POSTURE: UT elevation L>R Anteriorly rotated glenohumeral head/rounded  shoulders  UPPER EXTREMITY ROM: PROM WNL  Active ROM Right eval Left eval Left/Right 07/19/2023 Left 08/01/23 Supine   Shoulder flexion 130* 115* 140/165 AA: 150  Shoulder extension 55 55    Shoulder abduction 160 135*    Shoulder horizontal adduction   30/35   Shoulder internal rotation To sacrum To sacrum 80/75   Shoulder external rotation 15 -10 60/80 AA: 65  Elbow flexion      Elbow extension      Wrist flexion      Wrist extension      Wrist ulnar deviation      Wrist radial deviation      Wrist pronation      Wrist supination      (Blank rows = not tested, * = pain)  UPPER EXTREMITY MMT:  MMT Right eval Left eval Left/Right 07/19/2023  Shoulder flexion 3+ thumbs up and palm down 3+ thumbs up and palm down   Shoulder extension prone 4 3+   Shoulder abduction 3 palm down 3 palm down   Shoulder adduction     Shoulder internal rotation 5 5 28.5 pounds/21.1 pounds  Shoulder external rotation 3- 2+ 5.8 pounds/6.5 pounds  Middle trapezius prone 4 3   Lower trapezius prone  Unable without pain   Elbow flexion     Elbow extension     Wrist flexion     Wrist extension     Wrist ulnar deviation     Wrist radial deviation     Wrist pronation     Wrist supination     Grip strength (lbs)     (Blank rows = not tested)  SHOULDER SPECIAL TESTS: Did not perform  JOINT MOBILITY TESTING:  GH joint mobility tight with anterior mobs and especially into ER  PALPATION:  TTP L pec  TREATMENT DATE:  08/01/23:  TherEx:  Reactive ER walk outs, towel under left shoulder: green TB x 15  Supine AAROM: ER x 15 holding 3 sec Side lying: ER 2 x 10 holding end range 3-5 sec Side lying: shoulder abd 2 x 10 holding end range 3-5 sec Cross body stretch x 4 holding 15 sec UBE: Level 2 x 2 minutes each direction c rest break between  Functional Activities:  Rows: green TB 2  x 15 holding 3 seconds Shoulder extension: green TB 2 x 15 holding 3 sec Scapular retraction/shoulder depression x 10 holding 10 sec NMR:  Supine scapular stabilization x 20 slow controlled movements CCW/CW c 1 # weight     07/19/2023 Shoulder blade pinches/scapular retraction 10 x 5 seconds Shoulder Thera-Band ER Red 10 x 3 seconds  Shoulder Thera-Band IR Red 10 x 3 seconds  Functional Activities: Supine arm raises/Scapular protraction 20 x for 3 seconds with 6# (use 4# next time) for reaching and avoiding impingement Pull to chest/Rows/Scapular retraction 20 x Blue Thera-Band for pulling at work Shoulder extension Thera-Band Red 10 x palms up for pulling at work Check range and strength objectively   07/18/23 See HEP below Attempted RTC isometrics but painful so focused on scapular strengthening   PATIENT EDUCATION: Education details: Exam findings, POC, initial HEP Person educated: Patient Education method: Explanation, Demonstration, and Handouts Education comprehension: verbalized understanding, returned demonstration, and needs further education  HOME EXERCISE PROGRAM: Access Code: [email or web address] URL: [email or web address] Date: 07/19/2023 Prepared by: Terral Ferrari  Exercises - Doorway Pec Stretch at 60 Elevation  - 1 x daily - 7 x weekly - 2 sets - 30 sec hold - Standing Shoulder Row with Anchored Resistance  - 1-2 x daily - 7 x weekly - 2 sets - 10 reps - Shoulder extension with resistance - Neutral  - 1-2 x daily - 7 x weekly - 2 sets - 10 reps - Supine Scapular Protraction in Flexion with Dumbbells  - 2 x daily - 7 x weekly - 1 sets - 20 reps - 3 seconds hold - Standing Scapular Retraction  - 5 x daily - 7 x weekly - 1 sets - 5 reps - 5 second hold - Shoulder External Rotation with Anchored Resistance  - 2 x daily - 7 x weekly - 2 sets - 10 reps - 3 hold - Shoulder Internal Rotation with Resistance  - 2 x daily - 7 x weekly - 2 sets - 10 reps - 3  hold  ASSESSMENT:  CLINICAL IMPRESSION: 08/01/23: Autry Legions reporting pain comes and goes and can be affected by the weather. Pt reporting when he gets cold he can hardly lift his left arm. Pt did report good response to HEP and therapy interventions thus far. Pt reporting increased pain today with ER and flexion end range.  Pt tolerating general strengthening and ROM. Recommending continued skilled PT toward goals set.     OBJECTIVE IMPAIRMENTS: decreased activity tolerance, decreased endurance, decreased mobility, decreased ROM, decreased strength, increased fascial restrictions, increased muscle spasms, impaired UE functional use, improper body mechanics, postural dysfunction, and pain.   ACTIVITY LIMITATIONS: carrying, lifting, sleeping, dressing, and reach over head  PARTICIPATION LIMITATIONS: cleaning, community activity, occupation, and yard work  PERSONAL FACTORS: Age, Fitness, Past/current experiences, Profession, and Time since onset of injury/illness/exacerbation are also affecting patient's functional outcome.   REHAB POTENTIAL: Good  CLINICAL DECISION MAKING: Evolving/moderate complexity  EVALUATION COMPLEXITY: Moderate   GOALS: Goals reviewed with patient? Yes  SHORT  TERM GOALS: Target date: 08/15/2023   Pt will be ind with initial HEP Baseline: Goal status: MET 08/01/23  2.  Pt will have improved shoulder elevation to >/=140 deg on L Baseline:  Goal status: Met 07/19/2023   LONG TERM GOALS: Target date: 09/12/2023   Pt will be ind with management and progression of HEP Baseline:  Goal status: INITIAL  2.  Pt will be able to lift at least 10# above shoulder height with >/=50% less pain than baseline Baseline:  Goal status: INITIAL  3.  Pt will have improved PSFS to a 10 to demo MCID Baseline:  Goal status: INITIAL  4.  Pt will demo at least 4/5 bilat shoulder and scapular strength for improved lifting mechanics Baseline:  Goal status: INITIAL  5.  Pt  will report >/=50% improvement in overall pain with activities Baseline:  Goal status: INITIAL   PLAN:  PT FREQUENCY: 2x/week  PT DURATION: 8 weeks  PLANNED INTERVENTIONS: 97164- PT Re-evaluation, 97750- Physical Performance Testing, 97110-Therapeutic exercises, 97530- Therapeutic activity, [id number]- Neuromuscular re-education, 97535- Self Care, 16109- Manual therapy, [id number]- Electrical stimulation (unattended), 97016- Vasopneumatic device, [id number]- Ultrasound, 60454- Ionotophoresis 4mg /ml Dexamethasone , Patient/Family education, Taping, Dry Needling, Spinal mobilization, Cryotherapy, and Moist heat  PLAN FOR NEXT SESSION: Strengthen scapular and remaining intact rotator cuff muscles.   Marysue Sola, PT, MPT 08/01/2023, 9:25 AM

## 2023-08-08 ENCOUNTER — Ambulatory Visit (INDEPENDENT_AMBULATORY_CARE_PROVIDER_SITE_OTHER): Admitting: Physical Therapy

## 2023-08-08 ENCOUNTER — Encounter: Payer: Self-pay | Admitting: Physical Therapy

## 2023-08-08 DIAGNOSIS — M6281 Muscle weakness (generalized): Secondary | ICD-10-CM

## 2023-08-08 DIAGNOSIS — G8929 Other chronic pain: Secondary | ICD-10-CM | POA: Diagnosis not present

## 2023-08-08 DIAGNOSIS — R293 Abnormal posture: Secondary | ICD-10-CM

## 2023-08-08 DIAGNOSIS — M25512 Pain in left shoulder: Secondary | ICD-10-CM

## 2023-08-08 DIAGNOSIS — M25511 Pain in right shoulder: Secondary | ICD-10-CM | POA: Diagnosis not present

## 2023-08-08 NOTE — Therapy (Signed)
 OUTPATIENT PHYSICAL THERAPY SHOULDER TREATMENT   Patient Name: Scott Carson MRN: [id number] DOB:03/08/1970, 53 y.o., male Today's Date: 08/08/2023  END OF SESSION:  PT End of Session - 08/08/23 1434     Visit Number 4    Number of Visits 16    Date for PT Re-Evaluation 09/12/23    Authorization Type [employer]    PT Start Time 1434    PT Stop Time 1515    PT Time Calculation (min) 41 min    Activity Tolerance Patient tolerated treatment well;No increased pain    Behavior During Therapy WFL for tasks assessed/performed            Past Medical History:  Diagnosis Date   Arthritis    BACK   CVA (cerebral vascular accident) (HCC) 2020   Eczema of hand    Hypertension    Low back pain    Smoker    Past Surgical History:  Procedure Laterality Date   RESECTION DISTAL CLAVICAL Left 08/13/2015   Procedure: RESECTION DISTAL CLAVICAL;  Surgeon: Ferd Householder, MD;  Location: [facility];  Service: Orthopedics;  Laterality: Left;   SHOULDER ARTHROSCOPY WITH ROTATOR CUFF REPAIR AND SUBACROMIAL DECOMPRESSION Left 08/13/2015   Procedure: LEFT SHOULDER ARTHROSCOPY DEBRIDEMENT ACROMIOPLASTY, DISTAL CLAVICAL EXCISION ROTATOR CUFF REPAIR ;  Surgeon: Ferd Householder, MD;  Location: [facility];  Service: Orthopedics;  Laterality: Left;   TONSILLECTOMY     UPPER GASTROINTESTINAL ENDOSCOPY     2012,2013,2015 jacobs    Patient Active Problem List   Diagnosis Date Noted   Nontraumatic complete tear of right rotator cuff 07/12/2023   Dysphagia 02/26/2021   Stenosis of carotid artery 04/23/2020   Dilated aortic root (HCC) 04/23/2020   GERD (gastroesophageal reflux disease) 07/10/2019   OAG (open angle glaucoma) suspect, low risk, bilateral 02/18/2018   Tobacco use disorder 06/27/2014   Hand dermatitis 04/23/2014   Dizziness 01/24/2014   Left rotator cuff tear 12/20/2013   Urinary frequency 05/17/2013   Low back pain 01/07/2011   Essential hypertension  09/15/2006    PCP: Alto Atta, NP  REFERRING PROVIDER: Jude Norton, MD  REFERRING DIAG: [phone] (ICD-10-CM) - Nontraumatic complete tear of left rotator cuff   THERAPY DIAG:  Chronic pain of both shoulders  Muscle weakness (generalized)  Abnormal posture  Rationale for Evaluation and Treatment: Rehabilitation  ONSET DATE: at least 10 years  SUBJECTIVE:                                                                                                                                                                                      SUBJECTIVE STATEMENT: Pt states  he has been doing exercises but has been noting some anterior chest/shoulder pain with intermittent weakness. Reports he's been checking his BP which has been stable.   Pt states he had a rotator cuff repair on the left before and it retore. Pt reports he has to lift heavy at work -- works in [employer]. Has to lift overhead which is the most aggravating. Will carry at least 30 to 70 lbs. Pt states L shoulder hurts the most. Pt was recommended to try and do strengthening since he is too young for a full replacement.  Hand dominance: Right  PERTINENT HISTORY: L Rotator cuff repair ~5 years ago  PAIN:  Are you having pain? Yes: NPRS scale: 3/10 upon arrival, up to 9/10 over the weekend Pain location: Front of Lt shoulder Pain description: Numbness while sleeping  Aggravating factors: Overhead lifting, tenderness to the touch Relieving factors: Rest  PRECAUTIONS: None  RED FLAGS: None   WEIGHT BEARING RESTRICTIONS: No  FALLS:  Has patient fallen in last 6 months? No  LIVING ENVIRONMENT: Lives with: lives alone Lives in: House/apartment Stairs: No Has following equipment at home: None  OCCUPATION: 36-40 hours in [employer]  PLOF: Independent  PATIENT GOALS:Return to work with less pain  NEXT MD VISIT:   OBJECTIVE:  Note: Objective measures were completed at Evaluation unless otherwise  noted.  DIAGNOSTIC FINDINGS:  L shoulder MRI 06/10/23 IMPRESSION: 1. Prior rotator cuff repair with postsurgical changes in the humeral head. Complete full-thickness, full width tear of the supraspinatus and infraspinatus tendons with 3.8 cm of retraction.  R shoulder MRI 06/10/23 IMPRESSION: 1. Complete full-thickness, full width tear of the supraspinatus and infraspinatus tendons with 5 cm of retraction. 2. Mild tendinosis of the subscapularis tendon. 3. Mild tendinosis of the intra-articular portion of the long head of the biceps tendon. 4. Moderate arthropathy of the acromioclavicular joint with severe marrow edema on either side of the joint.  PATIENT SURVEYS:  QuickDASH Score: 4.5 / 100 = 4.5 % The Patient-Specific Functional Scale  Initial:  I am going to ask you to identify up to 3 important activities that you are unable to do or are having difficulty with as a result of this problem.  Today are there any activities that you are unable to do or having difficulty with because of this?  (Patient shown scale and patient rated each activity)  Follow up: When you first came in you had difficulty performing these activities.  Today do you still have difficulty?  Patient-Specific activity scoring scheme (Point to one number):  0 1 2 3 4 5 6 7 8 9  10 Unable                                                                                                          Able to perform To perform  activity at the same Activity         Level as before                                                                                                                       Injury or problem Activity Initial (eval): Follow up:  Lifting overhead 8          COGNITION: Overall cognitive status: Within functional limits for tasks assessed     SENSATION: WFL  POSTURE: UT elevation L>R Anteriorly  rotated glenohumeral head/rounded shoulders  UPPER EXTREMITY ROM: PROM WNL  Active ROM Right eval Left eval Left/Right 07/19/2023 Left 08/01/23 Supine   Shoulder flexion 130* 115* 140/165 AA: 150  Shoulder extension 55 55    Shoulder abduction 160 135*    Shoulder horizontal adduction   30/35   Shoulder internal rotation To sacrum To sacrum 80/75   Shoulder external rotation 15 -10 60/80 AA: 65  Elbow flexion      Elbow extension      Wrist flexion      Wrist extension      Wrist ulnar deviation      Wrist radial deviation      Wrist pronation      Wrist supination      (Blank rows = not tested, * = pain)  UPPER EXTREMITY MMT:  MMT Right eval Left eval Left/Right 07/19/2023  Shoulder flexion 3+ thumbs up and palm down 3+ thumbs up and palm down   Shoulder extension prone 4 3+   Shoulder abduction 3 palm down 3 palm down   Shoulder adduction     Shoulder internal rotation 5 5 28.5 pounds/21.1 pounds  Shoulder external rotation 3- 2+ 5.8 pounds/6.5 pounds  Middle trapezius prone 4 3   Lower trapezius prone  Unable without pain   Elbow flexion     Elbow extension     Wrist flexion     Wrist extension     Wrist ulnar deviation     Wrist radial deviation     Wrist pronation     Wrist supination     Grip strength (lbs)     (Blank rows = not tested)  SHOULDER SPECIAL TESTS: Did not perform  JOINT MOBILITY TESTING:  GH joint mobility tight with anterior mobs and especially into ER  PALPATION:  TTP L pec  TREATMENT DATE:  08/08/23: Vitals: BP 137/98 seated on L  TherEx: Pulleys flexion, scaption, horizontal shoulder abd x2 min each Seated scap squeeze into ball squeeze Doorway pec stretch 45 deg and 60 deg 2x30 each Supine AAROM partial range shoulder flexion 2x10 Supine shoulder horizontal abd red TB 2x10 Supine shoulder ER at 60 deg abd  2x10 Side lying: shoulder abd 2 x 10 holding end range 3-5 sec  Manual therapy: STM & TPR L pec major/minor Pin and stretch pec minor and major   08/01/23:  TherEx:  Reactive ER walk outs, towel under left shoulder: green TB x 15  Supine AAROM: ER x 15 holding 3 sec Side lying: ER 2 x 10 holding end range 3-5 sec Side lying: shoulder abd 2 x 10 holding end range 3-5 sec Cross body stretch x 4 holding 15 sec UBE: Level 2 x 2 minutes each direction c rest break between  Functional Activities:  Rows: green TB 2 x 15 holding 3 seconds Shoulder extension: green TB 2 x 15 holding 3 sec Scapular retraction/shoulder depression x 10 holding 10 sec NMR:  Supine scapular stabilization x 20 slow controlled movements CCW/CW c 1 # weight     07/19/2023 Shoulder blade pinches/scapular retraction 10 x 5 seconds Shoulder Thera-Band ER Red 10 x 3 seconds  Shoulder Thera-Band IR Red 10 x 3 seconds  Functional Activities: Supine arm raises/Scapular protraction 20 x for 3 seconds with 6# (use 4# next time) for reaching and avoiding impingement Pull to chest/Rows/Scapular retraction 20 x Blue Thera-Band for pulling at work Shoulder extension Thera-Band Red 10 x palms up for pulling at work Check range and strength objectively   07/18/23 See HEP below Attempted RTC isometrics but painful so focused on scapular strengthening   PATIENT EDUCATION: Education details: Exam findings, POC, initial HEP Person educated: Patient Education method: Explanation, Demonstration, and Handouts Education comprehension: verbalized understanding, returned demonstration, and needs further education  HOME EXERCISE PROGRAM: Access Code: [email or web address] URL: [email or web address] Date: 07/19/2023 Prepared by: Terral Ferrari  Exercises - Doorway Pec Stretch at 60 Elevation  - 1 x daily - 7 x weekly - 2 sets - 30 sec hold - Standing Shoulder Row with Anchored Resistance  - 1-2 x daily - 7 x weekly - 2  sets - 10 reps - Shoulder extension with resistance - Neutral  - 1-2 x daily - 7 x weekly - 2 sets - 10 reps - Supine Scapular Protraction in Flexion with Dumbbells  - 2 x daily - 7 x weekly - 1 sets - 20 reps - 3 seconds hold - Standing Scapular Retraction  - 5 x daily - 7 x weekly - 1 sets - 5 reps - 5 second hold - Shoulder External Rotation with Anchored Resistance  - 2 x daily - 7 x weekly - 2 sets - 10 reps - 3 hold - Shoulder Internal Rotation with Resistance  - 2 x daily - 7 x weekly - 2 sets - 10 reps - 3 hold  ASSESSMENT:  CLINICAL IMPRESSION: Pt comes in with continued anterior shoulder/chest pain and tightness. BP was relatively normal (pt sees cardiologist). Found pec minor and major to be very taut upon palpation. Provided manual work and stretching today. Focused on posterior shoulder strengthening to open chest and off set pec tightness.    OBJECTIVE IMPAIRMENTS: decreased activity tolerance, decreased endurance, decreased mobility, decreased ROM, decreased strength, increased fascial restrictions, increased muscle spasms, impaired UE functional use, improper body mechanics, postural dysfunction, and  pain.   ACTIVITY LIMITATIONS: carrying, lifting, sleeping, dressing, and reach over head  PARTICIPATION LIMITATIONS: cleaning, community activity, occupation, and yard work  PERSONAL FACTORS: Age, Fitness, Past/current experiences, Profession, and Time since onset of injury/illness/exacerbation are also affecting patient's functional outcome.   REHAB POTENTIAL: Good  CLINICAL DECISION MAKING: Evolving/moderate complexity  EVALUATION COMPLEXITY: Moderate   GOALS: Goals reviewed with patient? Yes  SHORT TERM GOALS: Target date: 08/15/2023   Pt will be ind with initial HEP Baseline: Goal status: MET 08/01/23  2.  Pt will have improved shoulder elevation to >/=140 deg on L Baseline:  Goal status: Met 07/19/2023   LONG TERM GOALS: Target date: 09/12/2023   Pt will be  ind with management and progression of HEP Baseline:  Goal status: INITIAL  2.  Pt will be able to lift at least 10# above shoulder height with >/=50% less pain than baseline Baseline:  Goal status: INITIAL  3.  Pt will have improved PSFS to a 10 to demo MCID Baseline:  Goal status: INITIAL  4.  Pt will demo at least 4/5 bilat shoulder and scapular strength for improved lifting mechanics Baseline:  Goal status: INITIAL  5.  Pt will report >/=50% improvement in overall pain with activities Baseline:  Goal status: INITIAL   PLAN:  PT FREQUENCY: 2x/week  PT DURATION: 8 weeks  PLANNED INTERVENTIONS: 97164- PT Re-evaluation, 97750- Physical Performance Testing, 97110-Therapeutic exercises, 97530- Therapeutic activity, [id number]- Neuromuscular re-education, 97535- Self Care, 69629- Manual therapy, [id number]- Electrical stimulation (unattended), 97016- Vasopneumatic device, [id number]- Ultrasound, 52841- Ionotophoresis 4mg /ml Dexamethasone , Patient/Family education, Taping, Dry Needling, Spinal mobilization, Cryotherapy, and Moist heat  PLAN FOR NEXT SESSION: Strengthen scapular and remaining intact rotator cuff muscles.   Julyssa Kyer April Ma L Joseandres Mazer, PT, DPT 08/08/2023, 2:34 PM

## 2023-08-11 ENCOUNTER — Telehealth: Payer: Self-pay | Admitting: Rehabilitative and Restorative Service Providers"

## 2023-08-11 ENCOUNTER — Encounter: Admitting: Rehabilitative and Restorative Service Providers"

## 2023-08-11 NOTE — Telephone Encounter (Signed)
 Spoke with Autry Legions.  He didn't know he had an appointment today.  He will be here Tuesday at 11 with April.

## 2023-08-11 NOTE — Therapy (Deleted)
 OUTPATIENT PHYSICAL THERAPY SHOULDER TREATMENT   Patient Name: Scott Carson MRN: [id number] DOB:09-29-1970, 53 y.o., male Today's Date: 08/11/2023  END OF SESSION:      Past Medical History:  Diagnosis Date   Arthritis    BACK   CVA (cerebral vascular accident) (HCC) 2020   Eczema of hand    Hypertension    Low back pain    Smoker    Past Surgical History:  Procedure Laterality Date   RESECTION DISTAL CLAVICAL Left 08/13/2015   Procedure: RESECTION DISTAL CLAVICAL;  Surgeon: Ferd Householder, MD;  Location: [facility];  Service: Orthopedics;  Laterality: Left;   SHOULDER ARTHROSCOPY WITH ROTATOR CUFF REPAIR AND SUBACROMIAL DECOMPRESSION Left 08/13/2015   Procedure: LEFT SHOULDER ARTHROSCOPY DEBRIDEMENT ACROMIOPLASTY, DISTAL CLAVICAL EXCISION ROTATOR CUFF REPAIR ;  Surgeon: Ferd Householder, MD;  Location: [facility];  Service: Orthopedics;  Laterality: Left;   TONSILLECTOMY     UPPER GASTROINTESTINAL ENDOSCOPY     2012,2013,2015 jacobs    Patient Active Problem List   Diagnosis Date Noted   Nontraumatic complete tear of right rotator cuff 07/12/2023   Dysphagia 02/26/2021   Stenosis of carotid artery 04/23/2020   Dilated aortic root (HCC) 04/23/2020   GERD (gastroesophageal reflux disease) 07/10/2019   OAG (open angle glaucoma) suspect, low risk, bilateral 02/18/2018   Tobacco use disorder 06/27/2014   Hand dermatitis 04/23/2014   Dizziness 01/24/2014   Left rotator cuff tear 12/20/2013   Urinary frequency 05/17/2013   Low back pain 01/07/2011   Essential hypertension 09/15/2006    PCP: Alto Atta, NP  REFERRING PROVIDER: Alto Atta, NP  REFERRING DIAG: [phone] (ICD-10-CM) - Nontraumatic complete tear of left rotator cuff   THERAPY DIAG:  No diagnosis found.  Rationale for Evaluation and Treatment: Rehabilitation  ONSET DATE: at least 10 years  SUBJECTIVE:                                                                                                                                                                                       SUBJECTIVE STATEMENT: *** Pt states he has been doing exercises but has been noting some anterior chest/shoulder pain with intermittent weakness. Reports he's been checking his BP which has been stable.   From eval: Pt states he had a rotator cuff repair on the left before and it retore. Pt reports he has to lift heavy at work -- works in [employer]. Has to lift overhead which is the most aggravating. Will carry at least 30 to 70 lbs. Pt states L shoulder hurts the most. Pt was recommended to try and do strengthening since he is too  young for a full replacement.  Hand dominance: Right  PERTINENT HISTORY: L Rotator cuff repair ~5 years ago  PAIN:  Are you having pain? Yes: NPRS scale: 3/10 upon arrival, up to 9/10 over the weekend Pain location: Front of Lt shoulder Pain description: Numbness while sleeping  Aggravating factors: Overhead lifting, tenderness to the touch Relieving factors: Rest  PRECAUTIONS: None  RED FLAGS: None   WEIGHT BEARING RESTRICTIONS: No  FALLS:  Has patient fallen in last 6 months? 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  DIAGNOSTIC FINDINGS:  L shoulder MRI 06/10/23 IMPRESSION: 1. Prior rotator cuff repair with postsurgical changes in the humeral head. Complete full-thickness, full width tear of the supraspinatus and infraspinatus tendons with 3.8 cm of retraction.  R shoulder MRI 06/10/23 IMPRESSION: 1. Complete full-thickness, full width tear of the supraspinatus and infraspinatus tendons with 5 cm of retraction. 2. Mild  tendinosis of the subscapularis tendon. 3. Mild tendinosis of the intra-articular portion of the long head of the biceps tendon. 4. Moderate arthropathy of the acromioclavicular joint with severe marrow edema on either side of the joint.  PATIENT SURVEYS:  QuickDASH Score: 4.5 / 100 = 4.5 % The Patient-Specific Functional Scale  Initial:  I am going to ask you to identify up to 3 important activities that you are unable to do or are having difficulty with as a result of this problem.  Today are there any activities that you are unable to do or having difficulty with because of this?  (Patient shown scale and patient rated each activity)  Follow up: When you first came in you had difficulty performing these activities.  Today do you still have difficulty?  Patient-Specific activity scoring scheme (Point to one number):  0 1 2 3 4 5 6 7 8 9  10 Unable                                                                                                          Able to perform To perform                                                                                                    activity at the same Activity         Level as before  Injury or problem Activity Initial (eval): Follow up:  Lifting overhead 8          COGNITION: Overall cognitive status: Within functional limits for tasks assessed     SENSATION: WFL  POSTURE: UT elevation L>R Anteriorly rotated glenohumeral head/rounded shoulders  UPPER EXTREMITY ROM: PROM WNL  Active ROM Right eval Left eval Left/Right 07/19/2023 Left 08/01/23 Supine   Shoulder flexion 130* 115* 140/165 AA: 150  Shoulder extension 55 55    Shoulder abduction 160 135*    Shoulder horizontal adduction   30/35   Shoulder internal rotation To sacrum To sacrum 80/75   Shoulder external rotation 15 -10 60/80 AA: 65  Elbow flexion       Elbow extension      Wrist flexion      Wrist extension      Wrist ulnar deviation      Wrist radial deviation      Wrist pronation      Wrist supination      (Blank rows = not tested, * = pain)  UPPER EXTREMITY MMT:  MMT Right eval Left eval Left/Right 07/19/2023  Shoulder flexion 3+ thumbs up and palm down 3+ thumbs up and palm down   Shoulder extension prone 4 3+   Shoulder abduction 3 palm down 3 palm down   Shoulder adduction     Shoulder internal rotation 5 5 28.5 pounds/21.1 pounds  Shoulder external rotation 3- 2+ 5.8 pounds/6.5 pounds  Middle trapezius prone 4 3   Lower trapezius prone  Unable without pain   Elbow flexion     Elbow extension     Wrist flexion     Wrist extension     Wrist ulnar deviation     Wrist radial deviation     Wrist pronation     Wrist supination     Grip strength (lbs)     (Blank rows = not tested)  SHOULDER SPECIAL TESTS: Did not perform  JOINT MOBILITY TESTING:  GH joint mobility tight with anterior mobs and especially into ER  PALPATION:  TTP L pec                                                                                                                    TREATMENT DATE:  08/11/23: ***  08/08/23: Vitals: BP 137/98 seated on L  TherEx: Pulleys flexion, scaption, horizontal shoulder abd x2 min each Seated scap squeeze into ball squeeze Doorway pec stretch 45 deg and 60 deg 2x30 each Supine AAROM partial range shoulder flexion 2x10 Supine shoulder horizontal abd red TB 2x10 Supine shoulder ER at 60 deg abd 2x10 Side lying: shoulder abd 2 x 10 holding end range 3-5 sec  Manual therapy: STM & TPR L pec major/minor Pin and stretch pec minor and major   08/01/23:  TherEx:  Reactive ER walk outs, towel under left shoulder: green TB x 15  Supine AAROM: ER x 15 holding 3 sec Side lying: ER 2 x 10 holding end  range 3-5 sec Side lying: shoulder abd 2 x 10 holding end range 3-5 sec Cross body stretch x 4 holding 15  sec UBE: Level 2 x 2 minutes each direction c rest break between  Functional Activities:  Rows: green TB 2 x 15 holding 3 seconds Shoulder extension: green TB 2 x 15 holding 3 sec Scapular retraction/shoulder depression x 10 holding 10 sec NMR:  Supine scapular stabilization x 20 slow controlled movements CCW/CW c 1 # weight     07/19/2023 Shoulder blade pinches/scapular retraction 10 x 5 seconds Shoulder Thera-Band ER Red 10 x 3 seconds  Shoulder Thera-Band IR Red 10 x 3 seconds  Functional Activities: Supine arm raises/Scapular protraction 20 x for 3 seconds with 6# (use 4# next time) for reaching and avoiding impingement Pull to chest/Rows/Scapular retraction 20 x Blue Thera-Band for pulling at work Shoulder extension Thera-Band Red 10 x palms up for pulling at work Check range and strength objectively   07/18/23 See HEP below Attempted RTC isometrics but painful so focused on scapular strengthening   PATIENT EDUCATION: Education details: Exam findings, POC, initial HEP Person educated: Patient Education method: Explanation, Demonstration, and Handouts Education comprehension: verbalized understanding, returned demonstration, and needs further education  HOME EXERCISE PROGRAM: Access Code: [email or web address] URL: [email or web address] Date: 07/19/2023 Prepared by: Terral Ferrari  Exercises - Doorway Pec Stretch at 60 Elevation  - 1 x daily - 7 x weekly - 2 sets - 30 sec hold - Standing Shoulder Row with Anchored Resistance  - 1-2 x daily - 7 x weekly - 2 sets - 10 reps - Shoulder extension with resistance - Neutral  - 1-2 x daily - 7 x weekly - 2 sets - 10 reps - Supine Scapular Protraction in Flexion with Dumbbells  - 2 x daily - 7 x weekly - 1 sets - 20 reps - 3 seconds hold - Standing Scapular Retraction  - 5 x daily - 7 x weekly - 1 sets - 5 reps - 5 second hold - Shoulder External Rotation with Anchored Resistance  - 2 x daily - 7 x weekly - 2 sets - 10 reps -  3 hold - Shoulder Internal Rotation with Resistance  - 2 x daily - 7 x weekly - 2 sets - 10 reps - 3 hold  ASSESSMENT:  CLINICAL IMPRESSION: *** Pt comes in with continued anterior shoulder/chest pain and tightness. BP was relatively normal (pt sees cardiologist). Found pec minor and major to be very taut upon palpation. Provided manual work and stretching today. Focused on posterior shoulder strengthening to open chest and off set pec tightness.    OBJECTIVE IMPAIRMENTS: decreased activity tolerance, decreased endurance, decreased mobility, decreased ROM, decreased strength, increased fascial restrictions, increased muscle spasms, impaired UE functional use, improper body mechanics, postural dysfunction, and pain.   ACTIVITY LIMITATIONS: carrying, lifting, sleeping, dressing, and reach over head  PARTICIPATION LIMITATIONS: cleaning, community activity, occupation, and yard work  PERSONAL FACTORS: Age, Fitness, Past/current experiences, Profession, and Time since onset of injury/illness/exacerbation are also affecting patient's functional outcome.   REHAB POTENTIAL: Good  CLINICAL DECISION MAKING: Evolving/moderate complexity  EVALUATION COMPLEXITY: Moderate   GOALS: Goals reviewed with patient? Yes  SHORT TERM GOALS: Target date: 08/15/2023   Pt will be ind with initial HEP Baseline: Goal status: MET 08/01/23  2.  Pt will have improved shoulder elevation to >/=140 deg on L Baseline:  Goal status: Met 07/19/2023   LONG TERM GOALS: Target date: 09/12/2023   Pt will be ind  with management and progression of HEP Baseline:  Goal status: INITIAL  2.  Pt will be able to lift at least 10# above shoulder height with >/=50% less pain than baseline Baseline:  Goal status: INITIAL  3.  Pt will have improved PSFS to a 10 to demo MCID Baseline:  Goal status: INITIAL  4.  Pt will demo at least 4/5 bilat shoulder and scapular strength for improved lifting mechanics Baseline:   Goal status: INITIAL  5.  Pt will report >/=50% improvement in overall pain with activities Baseline:  Goal status: INITIAL   PLAN:  PT FREQUENCY: 2x/week  PT DURATION: 8 weeks  PLANNED INTERVENTIONS: 97164- PT Re-evaluation, 97750- Physical Performance Testing, 97110-Therapeutic exercises, 97530- Therapeutic activity, [id number]- Neuromuscular re-education, 97535- Self Care, [id number]- Manual therapy, [id number]- Electrical stimulation (unattended), 97016- Vasopneumatic device, [id number]- Ultrasound, 19147- Ionotophoresis 4mg /ml Dexamethasone , Patient/Family education, Taping, Dry Needling, Spinal mobilization, Cryotherapy, and Moist heat  PLAN FOR NEXT SESSION: Strengthen scapular and remaining intact rotator cuff muscles.   Manning Luna April Ma L Alexx Giambra, PT, DPT 08/11/2023, 7:54 AM

## 2023-08-15 ENCOUNTER — Encounter: Payer: Self-pay | Admitting: Physical Therapy

## 2023-08-15 ENCOUNTER — Ambulatory Visit (INDEPENDENT_AMBULATORY_CARE_PROVIDER_SITE_OTHER): Admitting: Physical Therapy

## 2023-08-15 DIAGNOSIS — G8929 Other chronic pain: Secondary | ICD-10-CM | POA: Diagnosis not present

## 2023-08-15 DIAGNOSIS — R293 Abnormal posture: Secondary | ICD-10-CM | POA: Diagnosis not present

## 2023-08-15 DIAGNOSIS — M25511 Pain in right shoulder: Secondary | ICD-10-CM

## 2023-08-15 DIAGNOSIS — M25512 Pain in left shoulder: Secondary | ICD-10-CM

## 2023-08-15 DIAGNOSIS — M6281 Muscle weakness (generalized): Secondary | ICD-10-CM

## 2023-08-15 NOTE — Therapy (Signed)
 OUTPATIENT PHYSICAL THERAPY SHOULDER TREATMENT   Patient Name: RHEA THRUN MRN: [id number] DOB:1971/01/25, 53 y.o., male Today's Date: 08/15/2023  END OF SESSION:  PT End of Session - 08/15/23 1102     Visit Number 5    Number of Visits 16    Date for PT Re-Evaluation 09/12/23    Authorization Type BC[employer]    PT Start Time 1102    PT Stop Time 1140    PT Time Calculation (min) 38 min    Activity Tolerance Patient tolerated treatment well;No increased pain    Behavior During Therapy WFL for tasks assessed/performed            Past Medical History:  Diagnosis Date   Arthritis    BACK   CVA (cerebral vascular accident) (HCC) 2020   Eczema of hand    Hypertension    Low back pain    Smoker    Past Surgical History:  Procedure Laterality Date   RESECTION DISTAL CLAVICAL Left 08/13/2015   Procedure: RESECTION DISTAL CLAVICAL;  Surgeon: Toribio JULIANNA Chancy, MD;  Location: [facility];  Service: Orthopedics;  Laterality: Left;   SHOULDER ARTHROSCOPY WITH ROTATOR CUFF REPAIR AND SUBACROMIAL DECOMPRESSION Left 08/13/2015   Procedure: LEFT SHOULDER ARTHROSCOPY DEBRIDEMENT ACROMIOPLASTY, DISTAL CLAVICAL EXCISION ROTATOR CUFF REPAIR ;  Surgeon: Toribio JULIANNA Chancy, MD;  Location: [facility];  Service: Orthopedics;  Laterality: Left;   TONSILLECTOMY     UPPER GASTROINTESTINAL ENDOSCOPY     2012,2013,2015 jacobs    Patient Active Problem List   Diagnosis Date Noted   Nontraumatic complete tear of right rotator cuff 07/12/2023   Dysphagia 02/26/2021   Stenosis of carotid artery 04/23/2020   Dilated aortic root (HCC) 04/23/2020   GERD (gastroesophageal reflux disease) 07/10/2019   OAG (open angle glaucoma) suspect, low risk, bilateral 02/18/2018   Tobacco use disorder 06/27/2014   Hand dermatitis 04/23/2014   Dizziness 01/24/2014   Left rotator cuff tear 12/20/2013   Urinary frequency 05/17/2013   Low back pain 01/07/2011   Essential hypertension  09/15/2006    PCP: Merna Huxley, NP  REFERRING PROVIDER: Vita Morrow, MD  REFERRING DIAG: [phone] (ICD-10-CM) - Nontraumatic complete tear of left rotator cuff   THERAPY DIAG:  Chronic pain of both shoulders  Muscle weakness (generalized)  Abnormal posture  Rationale for Evaluation and Treatment: Rehabilitation  ONSET DATE: at least 10 years  SUBJECTIVE:                                                                                                                                                                                      SUBJECTIVE STATEMENT: Pt states  he watched his grandson this weekend.   Pt states he had a rotator cuff repair on the left before and it retore. Pt reports he has to lift heavy at work -- works in [employer]. Has to lift overhead which is the most aggravating. Will carry at least 30 to 70 lbs. Pt states L shoulder hurts the most. Pt was recommended to try and do strengthening since he is too young for a full replacement.  Hand dominance: Right  PERTINENT HISTORY: L Rotator cuff repair ~5 years ago  PAIN:  Are you having pain? Yes: NPRS scale: 3/10 upon arrival, up to 9/10 over the weekend Pain location: Front of Lt shoulder Pain description: Numbness while sleeping  Aggravating factors: Overhead lifting, tenderness to the touch Relieving factors: Rest  PRECAUTIONS: None  RED FLAGS: None   WEIGHT BEARING RESTRICTIONS: No  FALLS:  Has patient fallen in last 6 months? 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  DIAGNOSTIC FINDINGS:  L shoulder MRI 06/10/23 IMPRESSION: 1. Prior rotator cuff repair with postsurgical changes in  the humeral head. Complete full-thickness, full width tear of the supraspinatus and infraspinatus tendons with 3.8 cm of retraction.  R shoulder MRI 06/10/23 IMPRESSION: 1. Complete full-thickness, full width tear of the supraspinatus and infraspinatus tendons with 5 cm of retraction. 2. Mild tendinosis of the subscapularis tendon. 3. Mild tendinosis of the intra-articular portion of the long head of the biceps tendon. 4. Moderate arthropathy of the acromioclavicular joint with severe marrow edema on either side of the joint.  PATIENT SURVEYS:  QuickDASH Score: 4.5 / 100 = 4.5 % The Patient-Specific Functional Scale  Initial:  I am going to ask you to identify up to 3 important activities that you are unable to do or are having difficulty with as a result of this problem.  Today are there any activities that you are unable to do or having difficulty with because of this?  (Patient shown scale and patient rated each activity)  Follow up: When you first came in you had difficulty performing these activities.  Today do you still have difficulty?  Patient-Specific activity scoring scheme (Point to one number):  0 1 2 3 4 5 6 7 8 9  10 Unable                                                                                                          Able to perform To perform  activity at the same Activity         Level as before                                                                                                                       Injury or problem Activity Initial (eval): Follow up:  Lifting overhead 8          COGNITION: Overall cognitive status: Within functional limits for tasks assessed     SENSATION: WFL  POSTURE: UT elevation L>R Anteriorly rotated glenohumeral head/rounded shoulders  UPPER EXTREMITY ROM: PROM WNL  Active ROM Right eval Left eval Left/Right  07/19/2023 Left 08/01/23 Supine   Shoulder flexion 130* 115* 140/165 AA: 150  Shoulder extension 55 55    Shoulder abduction 160 135*    Shoulder horizontal adduction   30/35   Shoulder internal rotation To sacrum To sacrum 80/75   Shoulder external rotation 15 -10 60/80 AA: 65  Elbow flexion      Elbow extension      Wrist flexion      Wrist extension      Wrist ulnar deviation      Wrist radial deviation      Wrist pronation      Wrist supination      (Blank rows = not tested, * = pain)  UPPER EXTREMITY MMT:  MMT Right eval Left eval Left/Right 07/19/2023  Shoulder flexion 3+ thumbs up and palm down 3+ thumbs up and palm down   Shoulder extension prone 4 3+   Shoulder abduction 3 palm down 3 palm down   Shoulder adduction     Shoulder internal rotation 5 5 28.5 pounds/21.1 pounds  Shoulder external rotation 3- 2+ 5.8 pounds/6.5 pounds  Middle trapezius prone 4 3   Lower trapezius prone  Unable without pain   Elbow flexion     Elbow extension     Wrist flexion     Wrist extension     Wrist ulnar deviation     Wrist radial deviation     Wrist pronation     Wrist supination     Grip strength (lbs)     (Blank rows = not tested)  SHOULDER SPECIAL TESTS: Did not perform  JOINT MOBILITY TESTING:  GH joint mobility tight with anterior mobs and especially into ER  PALPATION:  TTP L pec  TREATMENT DATE:  08/15/23: TherEx: UBE L1; 3 min fwd, 3 min bwd Doorway bicep stretch x30 Doorway pec stretch 45 deg and 60 deg 2x30 each  Neuromuscular Re-ed: Wall scaption on R, too painful on L focusing on scapular rotation Scapular wall clock x10 on L, scapular clock on counter x10 on L Counter push up plus 2x10 (attempted wall but too much for pt) Seated reverse fly 2x10 working on decreasing UT overcompensation  Therapeutic Activity: Standing low row blue TB  2x10 Standing shoulder ext blue TB 2x10   08/08/23: Vitals: BP 137/98 seated on L  TherEx: Pulleys flexion, scaption, horizontal shoulder abd x2 min each Seated scap squeeze into ball squeeze Doorway pec stretch 45 deg and 60 deg 2x30 each Supine AAROM partial range shoulder flexion 2x10 Supine shoulder horizontal abd red TB 2x10 Supine shoulder ER at 60 deg abd 2x10 Side lying: shoulder abd 2 x 10 holding end range 3-5 sec  Manual therapy: STM & TPR L pec major/minor Pin and stretch pec minor and major   08/01/23:  TherEx:  Reactive ER walk outs, towel under left shoulder: green TB x 15  Supine AAROM: ER x 15 holding 3 sec Side lying: ER 2 x 10 holding end range 3-5 sec Side lying: shoulder abd 2 x 10 holding end range 3-5 sec Cross body stretch x 4 holding 15 sec UBE: Level 2 x 2 minutes each direction c rest break between  Functional Activities:  Rows: green TB 2 x 15 holding 3 seconds Shoulder extension: green TB 2 x 15 holding 3 sec Scapular retraction/shoulder depression x 10 holding 10 sec NMR:  Supine scapular stabilization x 20 slow controlled movements CCW/CW c 1 # weight     07/19/2023 Shoulder blade pinches/scapular retraction 10 x 5 seconds Shoulder Thera-Band ER Red 10 x 3 seconds  Shoulder Thera-Band IR Red 10 x 3 seconds  Functional Activities: Supine arm raises/Scapular protraction 20 x for 3 seconds with 6# (use 4# next time) for reaching and avoiding impingement Pull to chest/Rows/Scapular retraction 20 x Blue Thera-Band for pulling at work Shoulder extension Thera-Band Red 10 x palms up for pulling at work Check range and strength objectively   07/18/23 See HEP below Attempted RTC isometrics but painful so focused on scapular strengthening   PATIENT EDUCATION: Education details: Exam findings, POC, initial HEP Person educated: Patient Education method: Explanation, Demonstration, and Handouts Education comprehension: verbalized  understanding, returned demonstration, and needs further education  HOME EXERCISE PROGRAM: Access Code: [email or web address] URL: [email or web address] Date: 08/15/2023 Prepared by: Delford Wingert April Earnie Starring  Exercises - Standing Shoulder Row with Anchored Resistance  - 1-2 x daily - 7 x weekly - 2 sets - 10 reps - Shoulder extension with resistance - Neutral  - 1-2 x daily - 7 x weekly - 2 sets - 10 reps - Standing Scapular Retraction  - 5 x daily - 7 x weekly - 1 sets - 5 reps - 5 second hold - Shoulder External Rotation with Anchored Resistance  - 2 x daily - 7 x weekly - 2 sets - 10 reps - 3 hold - Shoulder Internal Rotation with Resistance  - 2 x daily - 7 x weekly - 2 sets - 10 reps - 3 hold - Supine Shoulder Horizontal Abduction with Resistance  - 1 x daily - 7 x weekly - 2 sets - 10 reps - Supine Shoulder External Rotation Stretch  - 1 x daily - 7 x weekly - 2 sets - 10  reps - Doorway Pec Stretch at 60 Elevation  - 1 x daily - 7 x weekly - 2 sets - 30 sec hold - Doorway Pec Stretch at 90 Degrees Abduction  - 1 x daily - 7 x weekly - 2 sets - 30 sec hold - Wall Clock  - 1 x daily - 7 x weekly - 1 sets - 10 reps - Wall Push Up with Plus  - 1 x daily - 7 x weekly - 2 sets - 10 reps  ASSESSMENT:  CLINICAL IMPRESSION: Discussed with pt consistent stretching for his pecs since this is one of his strongest muscle groups that he will utilize to compensate for his torn supraspinatus and remains one of his tightest and over utilized muscle. Worked on scapular mobility and control this session with pt demonstrating less scapular rotation on the L vs R. Difficulty performing scapular strengthening exercises on wall but tolerated performing them on counter.    OBJECTIVE IMPAIRMENTS: decreased activity tolerance, decreased endurance, decreased mobility, decreased ROM, decreased strength, increased fascial restrictions, increased muscle spasms, impaired UE functional use, improper body  mechanics, postural dysfunction, and pain.   ACTIVITY LIMITATIONS: carrying, lifting, sleeping, dressing, and reach over head  PARTICIPATION LIMITATIONS: cleaning, community activity, occupation, and yard work  PERSONAL FACTORS: Age, Fitness, Past/current experiences, Profession, and Time since onset of injury/illness/exacerbation are also affecting patient's functional outcome.   REHAB POTENTIAL: Good  CLINICAL DECISION MAKING: Evolving/moderate complexity  EVALUATION COMPLEXITY: Moderate   GOALS: Goals reviewed with patient? Yes  SHORT TERM GOALS: Target date: 08/15/2023   Pt will be ind with initial HEP Baseline: Goal status: MET 08/01/23  2.  Pt will have improved shoulder elevation to >/=140 deg on L Baseline:  Goal status: Met 07/19/2023   LONG TERM GOALS: Target date: 09/12/2023   Pt will be ind with management and progression of HEP Baseline:  Goal status: INITIAL  2.  Pt will be able to lift at least 10# above shoulder height with >/=50% less pain than baseline Baseline:  Goal status: INITIAL  3.  Pt will have improved PSFS to a 10 to demo MCID Baseline:  Goal status: INITIAL  4.  Pt will demo at least 4/5 bilat shoulder and scapular strength for improved lifting mechanics Baseline:  Goal status: INITIAL  5.  Pt will report >/=50% improvement in overall pain with activities Baseline:  Goal status: INITIAL   PLAN:  PT FREQUENCY: 2x/week  PT DURATION: 8 weeks  PLANNED INTERVENTIONS: 97164- PT Re-evaluation, 97750- Physical Performance Testing, 97110-Therapeutic exercises, 97530- Therapeutic activity, [id number]- Neuromuscular re-education, 97535- Self Care, [id number]- Manual therapy, [id number]- Electrical stimulation (unattended), 97016- Vasopneumatic device, [id number]- Ultrasound, [id number]- Ionotophoresis 4mg /ml Dexamethasone , Patient/Family education, Taping, Dry Needling, Spinal mobilization, Cryotherapy, and Moist heat  PLAN FOR NEXT SESSION: Strengthen scapular,  deltoids, and remaining intact rotator cuff muscles.   Astryd Pearcy April Ma L Dayln Tugwell, PT, DPT 08/15/2023, 11:04 AM

## 2023-08-17 ENCOUNTER — Encounter: Payer: Self-pay | Admitting: Physical Therapy

## 2023-08-17 ENCOUNTER — Ambulatory Visit: Admitting: Physical Therapy

## 2023-08-17 DIAGNOSIS — R293 Abnormal posture: Secondary | ICD-10-CM

## 2023-08-17 DIAGNOSIS — G8929 Other chronic pain: Secondary | ICD-10-CM | POA: Diagnosis not present

## 2023-08-17 DIAGNOSIS — M25512 Pain in left shoulder: Secondary | ICD-10-CM

## 2023-08-17 DIAGNOSIS — M6281 Muscle weakness (generalized): Secondary | ICD-10-CM

## 2023-08-17 DIAGNOSIS — M25511 Pain in right shoulder: Secondary | ICD-10-CM

## 2023-08-17 NOTE — Therapy (Signed)
 OUTPATIENT PHYSICAL THERAPY SHOULDER TREATMENT   Patient Name: Scott Carson MRN: [id number] DOB:02/08/1971, 53 y.o., male Today's Date: 08/17/2023  END OF SESSION:  PT End of Session - 08/17/23 1146     Visit Number 6    Number of Visits 16    Date for PT Re-Evaluation 09/12/23    Authorization Type BC[employer]    PT Start Time 1146    PT Stop Time 1225    PT Time Calculation (min) 39 min    Activity Tolerance Patient tolerated treatment well;No increased pain    Behavior During Therapy WFL for tasks assessed/performed            Past Medical History:  Diagnosis Date   Arthritis    BACK   CVA (cerebral vascular accident) (HCC) 2020   Eczema of hand    Hypertension    Low back pain    Smoker    Past Surgical History:  Procedure Laterality Date   RESECTION DISTAL CLAVICAL Left 08/13/2015   Procedure: RESECTION DISTAL CLAVICAL;  Surgeon: Toribio JULIANNA Chancy, MD;  Location: [facility];  Service: Orthopedics;  Laterality: Left;   SHOULDER ARTHROSCOPY WITH ROTATOR CUFF REPAIR AND SUBACROMIAL DECOMPRESSION Left 08/13/2015   Procedure: LEFT SHOULDER ARTHROSCOPY DEBRIDEMENT ACROMIOPLASTY, DISTAL CLAVICAL EXCISION ROTATOR CUFF REPAIR ;  Surgeon: Toribio JULIANNA Chancy, MD;  Location:  SURGERY CENTER;  Service: Orthopedics;  Laterality: Left;   TONSILLECTOMY     UPPER GASTROINTESTINAL ENDOSCOPY     2012,2013,2015 [email or web address]    Patient Active Problem List   Diagnosis Date Noted   Nontraumatic complete tear of right rotator cuff 07/12/2023   Dysphagia 02/26/2021   Stenosis of carotid artery 04/23/2020   Dilated aortic root (HCC) 04/23/2020   GERD (gastroesophageal reflux disease) 07/10/2019   OAG (open angle glaucoma) suspect, low risk, bilateral 02/18/2018   Tobacco use disorder 06/27/2014   Hand dermatitis 04/23/2014   Dizziness 01/24/2014   Left rotator cuff tear 12/20/2013   Urinary frequency 05/17/2013   Low back pain 01/07/2011   Essential hypertension  09/15/2006    PCP: Merna Huxley, NP  REFERRING PROVIDER: Vita Morrow, MD  REFERRING DIAG: [phone] (ICD-10-CM) - Nontraumatic complete tear of left rotator cuff   THERAPY DIAG:  Chronic pain of both shoulders  Muscle weakness (generalized)  Abnormal posture  Rationale for Evaluation and Treatment: Rehabilitation  ONSET DATE: at least 10 years  SUBJECTIVE:                                                                                                                                                                                      SUBJECTIVE STATEMENT: Pt reports  he has been doing his exercises. Still feels some achiness on the front of his L shoulder.   From eval: Pt states he had a rotator cuff repair on the left before and it retore. Pt reports he has to lift heavy at work -- works in [employer]. Has to lift overhead which is the most aggravating. Will carry at least 30 to 70 lbs. Pt states L shoulder hurts the most. Pt was recommended to try and do strengthening since he is too young for a full replacement.  Hand dominance: Right  PERTINENT HISTORY: L Rotator cuff repair ~5 years ago  PAIN:  Are you having pain? Yes: NPRS scale: 2/10 upon arrival, up to 9/10 over the weekend Pain location: Front of Lt shoulder Pain description: Numbness while sleeping  Aggravating factors: Overhead lifting, tenderness to the touch Relieving factors: Rest  PRECAUTIONS: None  RED FLAGS: None   WEIGHT BEARING RESTRICTIONS: No  FALLS:  Has patient fallen in last 6 months? 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  DIAGNOSTIC FINDINGS:  L shoulder MRI 06/10/23 IMPRESSION: 1.  Prior rotator cuff repair with postsurgical changes in the humeral head. Complete full-thickness, full width tear of the supraspinatus and infraspinatus tendons with 3.8 cm of retraction.  R shoulder MRI 06/10/23 IMPRESSION: 1. Complete full-thickness, full width tear of the supraspinatus and infraspinatus tendons with 5 cm of retraction. 2. Mild tendinosis of the subscapularis tendon. 3. Mild tendinosis of the intra-articular portion of the long head of the biceps tendon. 4. Moderate arthropathy of the acromioclavicular joint with severe marrow edema on either side of the joint.  PATIENT SURVEYS:  QuickDASH Score: 4.5 / 100 = 4.5 % The Patient-Specific Functional Scale  Initial:  I am going to ask you to identify up to 3 important activities that you are unable to do or are having difficulty with as a result of this problem.  Today are there any activities that you are unable to do or having difficulty with because of this?  (Patient shown scale and patient rated each activity)  Follow up: When you first came in you had difficulty performing these activities.  Today do you still have difficulty?  Patient-Specific activity scoring scheme (Point to one number):  0 1 2 3 4 5 6 7 8 9  10 Unable                                                                                                          Able to perform To perform  activity at the same Activity         Level as before                                                                                                                       Injury or problem Activity Initial (eval): Follow up:  Lifting overhead 8          COGNITION: Overall cognitive status: Within functional limits for tasks assessed     SENSATION: WFL  POSTURE: UT elevation L>R Anteriorly rotated glenohumeral head/rounded shoulders  UPPER EXTREMITY ROM: PROM  WNL  Active ROM Right eval Left eval Left/Right 07/19/2023 Left 08/01/23 Supine   Shoulder flexion 130* 115* 140/165 AA: 150  Shoulder extension 55 55    Shoulder abduction 160 135*    Shoulder horizontal adduction   30/35   Shoulder internal rotation To sacrum To sacrum 80/75   Shoulder external rotation 15 -10 60/80 AA: 65  Elbow flexion      Elbow extension      Wrist flexion      Wrist extension      Wrist ulnar deviation      Wrist radial deviation      Wrist pronation      Wrist supination      (Blank rows = not tested, * = pain)  UPPER EXTREMITY MMT:  MMT Right eval Left eval Left/Right 07/19/2023  Shoulder flexion 3+ thumbs up and palm down 3+ thumbs up and palm down   Shoulder extension prone 4 3+   Shoulder abduction 3 palm down 3 palm down   Shoulder adduction     Shoulder internal rotation 5 5 28.5 pounds/21.1 pounds  Shoulder external rotation 3- 2+ 5.8 pounds/6.5 pounds  Middle trapezius prone 4 3   Lower trapezius prone  Unable without pain   Elbow flexion     Elbow extension     Wrist flexion     Wrist extension     Wrist ulnar deviation     Wrist radial deviation     Wrist pronation     Wrist supination     Grip strength (lbs)     (Blank rows = not tested)  SHOULDER SPECIAL TESTS: Did not perform  JOINT MOBILITY TESTING:  GH joint mobility tight with anterior mobs and especially into ER  PALPATION:  TTP L pec  TREATMENT DATE:  08/17/23: TherEx: UBE L1; 3 min fwd, 3 min bwd Supine scapular retraction 2x10 Supine hands behind head pec stretch 2x30 with diaphragmatic breaths  Neuromuscular Re-ed: Supine shoulder ER at 90 deg abd 2x10 Scapular clock on counter yellow TB 2x10 on L Shoulder flexion wall slides 2x10 Low trap setting 2x10  Manual Therapy: STM, TPR and manual stretch L pec  Self Care: Self massage with tennis  ball along pec   08/15/23: TherEx: UBE L1; 3 min fwd, 3 min bwd Doorway bicep stretch x30 Doorway pec stretch 45 deg and 60 deg 2x30 each  Neuromuscular Re-ed: Wall scaption on R, too painful on L focusing on scapular rotation Scapular wall clock x10 on L, scapular clock on counter x10 on L Counter push up plus 2x10 (attempted wall but too much for pt) Seated reverse fly 2x10 working on decreasing UT overcompensation  Therapeutic Activity: Standing low row blue TB 2x10 Standing shoulder ext blue TB 2x10   08/08/23: Vitals: BP 137/98 seated on L  TherEx: Pulleys flexion, scaption, horizontal shoulder abd x2 min each Seated scap squeeze into ball squeeze Doorway pec stretch 45 deg and 60 deg 2x30 each Supine AAROM partial range shoulder flexion 2x10 Supine shoulder horizontal abd red TB 2x10 Supine shoulder ER at 60 deg abd 2x10 Side lying: shoulder abd 2 x 10 holding end range 3-5 sec  Manual therapy: STM & TPR L pec major/minor Pin and stretch pec minor and major   08/01/23:  TherEx:  Reactive ER walk outs, towel under left shoulder: green TB x 15  Supine AAROM: ER x 15 holding 3 sec Side lying: ER 2 x 10 holding end range 3-5 sec Side lying: shoulder abd 2 x 10 holding end range 3-5 sec Cross body stretch x 4 holding 15 sec UBE: Level 2 x 2 minutes each direction c rest break between  Functional Activities:  Rows: green TB 2 x 15 holding 3 seconds Shoulder extension: green TB 2 x 15 holding 3 sec Scapular retraction/shoulder depression x 10 holding 10 sec NMR:  Supine scapular stabilization x 20 slow controlled movements CCW/CW c 1 # weight     07/19/2023 Shoulder blade pinches/scapular retraction 10 x 5 seconds Shoulder Thera-Band ER Red 10 x 3 seconds  Shoulder Thera-Band IR Red 10 x 3 seconds  Functional Activities: Supine arm raises/Scapular protraction 20 x for 3 seconds with 6# (use 4# next time) for reaching and avoiding impingement Pull to  chest/Rows/Scapular retraction 20 x Blue Thera-Band for pulling at work Shoulder extension Thera-Band Red 10 x palms up for pulling at work Check range and strength objectively   07/18/23 See HEP below Attempted RTC isometrics but painful so focused on scapular strengthening   PATIENT EDUCATION: Education details: Exam findings, POC, initial HEP Person educated: Patient Education method: Explanation, Demonstration, and Handouts Education comprehension: verbalized understanding, returned demonstration, and needs further education  HOME EXERCISE PROGRAM: Access Code: [email or web address] URL: [email or web address] Date: 08/15/2023 Prepared by: Lenord Fralix April Earnie Starring  Exercises - Standing Shoulder Row with Anchored Resistance  - 1-2 x daily - 7 x weekly - 2 sets - 10 reps - Shoulder extension with resistance - Neutral  - 1-2 x daily - 7 x weekly - 2 sets - 10 reps - Standing Scapular Retraction  - 5 x daily - 7 x weekly - 1 sets - 5 reps - 5 second hold - Shoulder External Rotation with Anchored Resistance  - 2 x daily - 7 x weekly -  2 sets - 10 reps - 3 hold - Shoulder Internal Rotation with Resistance  - 2 x daily - 7 x weekly - 2 sets - 10 reps - 3 hold - Supine Shoulder Horizontal Abduction with Resistance  - 1 x daily - 7 x weekly - 2 sets - 10 reps - Supine Shoulder External Rotation Stretch  - 1 x daily - 7 x weekly - 2 sets - 10 reps - Doorway Pec Stretch at 60 Elevation  - 1 x daily - 7 x weekly - 2 sets - 30 sec hold - Doorway Pec Stretch at 90 Degrees Abduction  - 1 x daily - 7 x weekly - 2 sets - 30 sec hold - Wall Clock  - 1 x daily - 7 x weekly - 1 sets - 10 reps - Wall Push Up with Plus  - 1 x daily - 7 x weekly - 2 sets - 10 reps  ASSESSMENT:  CLINICAL IMPRESSION: Manual work provided for pt's L pecs. Continued to work on scapular strengthening and shoulder flexion eccentrics with wall support. Pt reports his biggest problem with pain is when he brings his arm  down. Overall, pt is demonstrating improvements with scapular strength and mobility and lessening pain.    OBJECTIVE IMPAIRMENTS: decreased activity tolerance, decreased endurance, decreased mobility, decreased ROM, decreased strength, increased fascial restrictions, increased muscle spasms, impaired UE functional use, improper body mechanics, postural dysfunction, and pain.   ACTIVITY LIMITATIONS: carrying, lifting, sleeping, dressing, and reach over head  PARTICIPATION LIMITATIONS: cleaning, community activity, occupation, and yard work  PERSONAL FACTORS: Age, Fitness, Past/current experiences, Profession, and Time since onset of injury/illness/exacerbation are also affecting patient's functional outcome.   REHAB POTENTIAL: Good  CLINICAL DECISION MAKING: Evolving/moderate complexity  EVALUATION COMPLEXITY: Moderate   GOALS: Goals reviewed with patient? Yes  SHORT TERM GOALS: Target date: 08/15/2023   Pt will be ind with initial HEP Baseline: Goal status: MET 08/01/23  2.  Pt will have improved shoulder elevation to >/=140 deg on L Baseline:  Goal status: Met 07/19/2023   LONG TERM GOALS: Target date: 09/12/2023   Pt will be ind with management and progression of HEP Baseline:  Goal status: INITIAL  2.  Pt will be able to lift at least 10# above shoulder height with >/=50% less pain than baseline Baseline:  Goal status: INITIAL  3.  Pt will have improved PSFS to a 10 to demo MCID Baseline:  Goal status: INITIAL  4.  Pt will demo at least 4/5 bilat shoulder and scapular strength for improved lifting mechanics Baseline:  Goal status: INITIAL  5.  Pt will report >/=50% improvement in overall pain with activities Baseline:  Goal status: INITIAL   PLAN:  PT FREQUENCY: 2x/week  PT DURATION: 8 weeks  PLANNED INTERVENTIONS: 97164- PT Re-evaluation, 97750- Physical Performance Testing, 97110-Therapeutic exercises, 97530- Therapeutic activity, [id number]- Neuromuscular  re-education, 97535- Self Care, [id number]- Manual therapy, [id number]- Electrical stimulation (unattended), 97016- Vasopneumatic device, [id number]- Ultrasound, 02966- Ionotophoresis 4mg /ml Dexamethasone , Patient/Family education, Taping, Dry Needling, Spinal mobilization, Cryotherapy, and Moist heat  PLAN FOR NEXT SESSION: Strengthen scapular, deltoids, and remaining intact rotator cuff muscles.   Raelan Burgoon April Ma L Baylee Mccorkel, PT, DPT 08/17/2023, 11:46 AM

## 2023-08-22 ENCOUNTER — Ambulatory Visit (INDEPENDENT_AMBULATORY_CARE_PROVIDER_SITE_OTHER): Admitting: Physical Therapy

## 2023-08-22 ENCOUNTER — Encounter: Payer: Self-pay | Admitting: Physical Therapy

## 2023-08-22 DIAGNOSIS — M25511 Pain in right shoulder: Secondary | ICD-10-CM

## 2023-08-22 DIAGNOSIS — G8929 Other chronic pain: Secondary | ICD-10-CM | POA: Diagnosis not present

## 2023-08-22 DIAGNOSIS — M6281 Muscle weakness (generalized): Secondary | ICD-10-CM

## 2023-08-22 DIAGNOSIS — R293 Abnormal posture: Secondary | ICD-10-CM

## 2023-08-22 DIAGNOSIS — M25512 Pain in left shoulder: Secondary | ICD-10-CM

## 2023-08-22 NOTE — Therapy (Signed)
 OUTPATIENT PHYSICAL THERAPY SHOULDER TREATMENT   Patient Name: Scott Carson MRN: [id number] DOB:12/31/1970, 53 y.o., male Today's Date: 08/22/2023  END OF SESSION:  PT End of Session - 08/22/23 0929     Visit Number 7    Number of Visits 16    Date for PT Re-Evaluation 09/12/23    Authorization Type [employer]    PT Start Time 0930    PT Stop Time 1010    PT Time Calculation (min) 40 min    Activity Tolerance Patient tolerated treatment well;No increased pain    Behavior During Therapy WFL for tasks assessed/performed             Past Medical History:  Diagnosis Date   Arthritis    BACK   CVA (cerebral vascular accident) (HCC) 2020   Eczema of hand    Hypertension    Low back pain    Smoker    Past Surgical History:  Procedure Laterality Date   RESECTION DISTAL CLAVICAL Left 08/13/2015   Procedure: RESECTION DISTAL CLAVICAL;  Surgeon: Toribio JULIANNA Chancy, MD;  Location: [facility];  Service: Orthopedics;  Laterality: Left;   SHOULDER ARTHROSCOPY WITH ROTATOR CUFF REPAIR AND SUBACROMIAL DECOMPRESSION Left 08/13/2015   Procedure: LEFT SHOULDER ARTHROSCOPY DEBRIDEMENT ACROMIOPLASTY, DISTAL CLAVICAL EXCISION ROTATOR CUFF REPAIR ;  Surgeon: Toribio JULIANNA Chancy, MD;  Location: [facility];  Service: Orthopedics;  Laterality: Left;   TONSILLECTOMY     UPPER GASTROINTESTINAL ENDOSCOPY     2012,2013,2015 [email or web address]    Patient Active Problem List   Diagnosis Date Noted   Nontraumatic complete tear of right rotator cuff 07/12/2023   Dysphagia 02/26/2021   Stenosis of carotid artery 04/23/2020   Dilated aortic root (HCC) 04/23/2020   GERD (gastroesophageal reflux disease) 07/10/2019   OAG (open angle glaucoma) suspect, low risk, bilateral 02/18/2018   Tobacco use disorder 06/27/2014   Hand dermatitis 04/23/2014   Dizziness 01/24/2014   Left rotator cuff tear 12/20/2013   Urinary frequency 05/17/2013   Low back pain 01/07/2011   Essential hypertension  09/15/2006    PCP: Merna Huxley, NP  REFERRING PROVIDER: Vita Morrow, MD  REFERRING DIAG: [phone] (ICD-10-CM) - Nontraumatic complete tear of left rotator cuff   THERAPY DIAG:  Chronic pain of both shoulders  Muscle weakness (generalized)  Abnormal posture  Rationale for Evaluation and Treatment: Rehabilitation  ONSET DATE: at least 10 years  SUBJECTIVE:                                                                                                                                                                                      SUBJECTIVE STATEMENT: Everything  feels good except at night time I can feel the pain when I wake up. Pain is no more than a 1 or 2 at most. Overall I'm doing good  From eval: Pt states he had a rotator cuff repair on the left before and it retore. Pt reports he has to lift heavy at work -- works in [employer]. Has to lift overhead which is the most aggravating. Will carry at least 30 to 70 lbs. Pt states L shoulder hurts the most. Pt was recommended to try and do strengthening since he is too young for a full replacement.  Hand dominance: Right  PERTINENT HISTORY: L Rotator cuff repair ~5 years ago  PAIN:  Are you having pain? Yes: NPRS scale: 2/10 upon arrival, up to 9/10 over the weekend Pain location: Front of Lt shoulder Pain description: Numbness while sleeping  Aggravating factors: Overhead lifting, tenderness to the touch Relieving factors: Rest  PRECAUTIONS: None  RED FLAGS: None   WEIGHT BEARING RESTRICTIONS: No  FALLS:  Has patient fallen in last 6 months? 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  DIAGNOSTIC FINDINGS:   L shoulder MRI 06/10/23 IMPRESSION: 1. Prior rotator cuff repair with postsurgical changes in the humeral head. Complete full-thickness, full width tear of the supraspinatus and infraspinatus tendons with 3.8 cm of retraction.  R shoulder MRI 06/10/23 IMPRESSION: 1. Complete full-thickness, full width tear of the supraspinatus and infraspinatus tendons with 5 cm of retraction. 2. Mild tendinosis of the subscapularis tendon. 3. Mild tendinosis of the intra-articular portion of the long head of the biceps tendon. 4. Moderate arthropathy of the acromioclavicular joint with severe marrow edema on either side of the joint.  PATIENT SURVEYS:  QuickDASH Score: 4.5 / 100 = 4.5 % The Patient-Specific Functional Scale  Initial:  I am going to ask you to identify up to 3 important activities that you are unable to do or are having difficulty with as a result of this problem.  Today are there any activities that you are unable to do or having difficulty with because of this?  (Patient shown scale and patient rated each activity)  Follow up: When you first came in you had difficulty performing these activities.  Today do you still have difficulty?  Patient-Specific activity scoring scheme (Point to one number):  0 1 2 3 4 5 6 7 8 9  10 Unable                                                                                                          Able to perform To perform  activity at the same Activity         Level as before                                                                                                                       Injury or problem Activity Initial (eval): Follow up:  Lifting overhead 8          COGNITION: Overall cognitive status: Within functional limits for tasks assessed     SENSATION: WFL  POSTURE: UT elevation L>R Anteriorly rotated glenohumeral head/rounded  shoulders  UPPER EXTREMITY ROM: PROM WNL  Active ROM Right eval Left eval Left/Right 07/19/2023 Left 08/01/23 Supine   Shoulder flexion 130* 115* 140/165 AA: 150  Shoulder extension 55 55    Shoulder abduction 160 135*    Shoulder horizontal adduction   30/35   Shoulder internal rotation To sacrum To sacrum 80/75   Shoulder external rotation 15 -10 60/80 AA: 65  Elbow flexion      Elbow extension      Wrist flexion      Wrist extension      Wrist ulnar deviation      Wrist radial deviation      Wrist pronation      Wrist supination      (Blank rows = not tested, * = pain)  UPPER EXTREMITY MMT:  MMT Right eval Left eval Left/Right 07/19/2023  Shoulder flexion 3+ thumbs up and palm down 3+ thumbs up and palm down   Shoulder extension prone 4 3+   Shoulder abduction 3 palm down 3 palm down   Shoulder adduction     Shoulder internal rotation 5 5 28.5 pounds/21.1 pounds  Shoulder external rotation 3- 2+ 5.8 pounds/6.5 pounds  Middle trapezius prone 4 3   Lower trapezius prone  Unable without pain   Elbow flexion     Elbow extension     Wrist flexion     Wrist extension     Wrist ulnar deviation     Wrist radial deviation     Wrist pronation     Wrist supination     Grip strength (lbs)     (Blank rows = not tested)  SHOULDER SPECIAL TESTS: Did not perform  JOINT MOBILITY TESTING:  GH joint mobility tight with anterior mobs and especially into ER  PALPATION:  TTP L pec  TREATMENT DATE:  08/22/23: TherEx: Shoulder flexion, abd, horizontal abd 2x10 Supine diagonals yellow TB 2x10 Supine Y with yellow TB 2x10 Supine Serratus anterior punch red TB 2x10  Neuromuscular Re-ed: Scapular clock on counter yellow TB 2x10 R&L *attempted red but caused some sharp pain Ball on wall circles 2x10 R&L in flexion Ball on wall circles 2x10 R&L in  scaption   08/17/23: TherEx: UBE L1; 3 min fwd, 3 min bwd Supine scapular retraction 2x10 Supine hands behind head pec stretch 2x30 with diaphragmatic breaths  Neuromuscular Re-ed: Supine shoulder ER at 90 deg abd 2x10 Scapular clock on counter yellow TB 2x10 on L Shoulder flexion wall slides 2x10 Low trap setting 2x10  Manual Therapy: STM, TPR and manual stretch L pec  Self Care: Self massage with tennis ball along pec   08/15/23: TherEx: UBE L1; 3 min fwd, 3 min bwd Doorway bicep stretch x30 Doorway pec stretch 45 deg and 60 deg 2x30 each  Neuromuscular Re-ed: Wall scaption on R, too painful on L focusing on scapular rotation Scapular wall clock x10 on L, scapular clock on counter x10 on L Counter push up plus 2x10 (attempted wall but too much for pt) Seated reverse fly 2x10 working on decreasing UT overcompensation  Therapeutic Activity: Standing low row blue TB 2x10 Standing shoulder ext blue TB 2x10   08/08/23: Vitals: BP 137/98 seated on L  TherEx: Pulleys flexion, scaption, horizontal shoulder abd x2 min each Seated scap squeeze into ball squeeze Doorway pec stretch 45 deg and 60 deg 2x30 each Supine AAROM partial range shoulder flexion 2x10 Supine shoulder horizontal abd red TB 2x10 Supine shoulder ER at 60 deg abd 2x10 Side lying: shoulder abd 2 x 10 holding end range 3-5 sec  Manual therapy: STM & TPR L pec major/minor Pin and stretch pec minor and major   08/01/23:  TherEx:  Reactive ER walk outs, towel under left shoulder: green TB x 15  Supine AAROM: ER x 15 holding 3 sec Side lying: ER 2 x 10 holding end range 3-5 sec Side lying: shoulder abd 2 x 10 holding end range 3-5 sec Cross body stretch x 4 holding 15 sec UBE: Level 2 x 2 minutes each direction c rest break between  Functional Activities:  Rows: green TB 2 x 15 holding 3 seconds Shoulder extension: green TB 2 x 15 holding 3 sec Scapular retraction/shoulder depression x 10  holding 10 sec NMR:  Supine scapular stabilization x 20 slow controlled movements CCW/CW c 1 # weight     07/19/2023 Shoulder blade pinches/scapular retraction 10 x 5 seconds Shoulder Thera-Band ER Red 10 x 3 seconds  Shoulder Thera-Band IR Red 10 x 3 seconds  Functional Activities: Supine arm raises/Scapular protraction 20 x for 3 seconds with 6# (use 4# next time) for reaching and avoiding impingement Pull to chest/Rows/Scapular retraction 20 x Blue Thera-Band for pulling at work Shoulder extension Thera-Band Red 10 x palms up for pulling at work Check range and strength objectively   07/18/23 See HEP below Attempted RTC isometrics but painful so focused on scapular strengthening   PATIENT EDUCATION: Education details: Exam findings, POC, initial HEP Person educated: Patient Education method: Explanation, Demonstration, and Handouts Education comprehension: verbalized understanding, returned demonstration, and needs further education  HOME EXERCISE PROGRAM: Access Code: [email or web address] URL: [email or web address] Date: 08/15/2023 Prepared by: Xandria Gallaga April Earnie Starring  Exercises - Standing Shoulder Row with Anchored Resistance  - 1-2 x daily - 7 x weekly - 2 sets - 10  reps - Shoulder extension with resistance - Neutral  - 1-2 x daily - 7 x weekly - 2 sets - 10 reps - Standing Scapular Retraction  - 5 x daily - 7 x weekly - 1 sets - 5 reps - 5 second hold - Shoulder External Rotation with Anchored Resistance  - 2 x daily - 7 x weekly - 2 sets - 10 reps - 3 hold - Shoulder Internal Rotation with Resistance  - 2 x daily - 7 x weekly - 2 sets - 10 reps - 3 hold - Supine Shoulder Horizontal Abduction with Resistance  - 1 x daily - 7 x weekly - 2 sets - 10 reps - Supine Shoulder External Rotation Stretch  - 1 x daily - 7 x weekly - 2 sets - 10 reps - Doorway Pec Stretch at 60 Elevation  - 1 x daily - 7 x weekly - 2 sets - 30 sec hold - Doorway Pec Stretch at 90 Degrees  Abduction  - 1 x daily - 7 x weekly - 2 sets - 30 sec hold - Wall Clock  - 1 x daily - 7 x weekly - 1 sets - 10 reps - Wall Push Up with Plus  - 1 x daily - 7 x weekly - 2 sets - 10 reps  ASSESSMENT:  CLINICAL IMPRESSION: Less anterior shoulder pain. Some pain in the mornings. Otherwise, continued to work on scapular strengthening and motor control. Has been tolerating yellow TB which is the lightest resistance. Will continue to work and progress pt as tolerated for heavier lifting tasks. Will be following up with Dr. Jerri in the coming week and hopefully get approved for more visits by worker's comp.    OBJECTIVE IMPAIRMENTS: decreased activity tolerance, decreased endurance, decreased mobility, decreased ROM, decreased strength, increased fascial restrictions, increased muscle spasms, impaired UE functional use, improper body mechanics, postural dysfunction, and pain.   ACTIVITY LIMITATIONS: carrying, lifting, sleeping, dressing, and reach over head  PARTICIPATION LIMITATIONS: cleaning, community activity, occupation, and yard work  PERSONAL FACTORS: Age, Fitness, Past/current experiences, Profession, and Time since onset of injury/illness/exacerbation are also affecting patient's functional outcome.   REHAB POTENTIAL: Good  CLINICAL DECISION MAKING: Evolving/moderate complexity  EVALUATION COMPLEXITY: Moderate   GOALS: Goals reviewed with patient? Yes  SHORT TERM GOALS: Target date: 08/15/2023   Pt will be ind with initial HEP Baseline: Goal status: MET 08/01/23  2.  Pt will have improved shoulder elevation to >/=140 deg on L Baseline:  Goal status: Met 07/19/2023   LONG TERM GOALS: Target date: 09/12/2023   Pt will be ind with management and progression of HEP Baseline:  Goal status: INITIAL  2.  Pt will be able to lift at least 10# above shoulder height with >/=50% less pain than baseline Baseline:  Goal status: INITIAL  3.  Pt will have improved PSFS to a 10 to demo  MCID Baseline:  Goal status: INITIAL  4.  Pt will demo at least 4/5 bilat shoulder and scapular strength for improved lifting mechanics Baseline:  Goal status: INITIAL  5.  Pt will report >/=50% improvement in overall pain with activities Baseline:  Goal status: INITIAL   PLAN:  PT FREQUENCY: 2x/week  PT DURATION: 8 weeks  PLANNED INTERVENTIONS: 97164- PT Re-evaluation, 97750- Physical Performance Testing, 97110-Therapeutic exercises, 97530- Therapeutic activity, [id number]- Neuromuscular re-education, 97535- Self Care, [id number]- Manual therapy, [id number]- Electrical stimulation (unattended), 97016- Vasopneumatic device, [id number]- Ultrasound, [id number]- Ionotophoresis 4mg /ml Dexamethasone , Patient/Family education, Taping, Dry Needling, Spinal mobilization,  Cryotherapy, and Moist heat  PLAN FOR NEXT SESSION: Strengthen scapular, deltoids, and remaining intact rotator cuff muscles.   Briasia Flinders April Ma L Zohaib Heeney, PT, DPT 08/22/2023, 9:30 AM

## 2023-08-24 ENCOUNTER — Encounter: Payer: Self-pay | Admitting: Physical Therapy

## 2023-08-24 ENCOUNTER — Ambulatory Visit (INDEPENDENT_AMBULATORY_CARE_PROVIDER_SITE_OTHER): Admitting: Physical Therapy

## 2023-08-24 DIAGNOSIS — G8929 Other chronic pain: Secondary | ICD-10-CM | POA: Diagnosis not present

## 2023-08-24 DIAGNOSIS — M25511 Pain in right shoulder: Secondary | ICD-10-CM | POA: Diagnosis not present

## 2023-08-24 DIAGNOSIS — M6281 Muscle weakness (generalized): Secondary | ICD-10-CM

## 2023-08-24 DIAGNOSIS — R293 Abnormal posture: Secondary | ICD-10-CM

## 2023-08-24 DIAGNOSIS — M25512 Pain in left shoulder: Secondary | ICD-10-CM

## 2023-08-24 NOTE — Therapy (Addendum)
 OUTPATIENT PHYSICAL THERAPY SHOULDER TREATMENT / DISCHARGE   Patient Name: Scott Carson MRN: [id number] DOB:Apr 15, 1970, 53 y.o., male Today's Date: 08/24/2023  END OF SESSION:  PT End of Session - 08/24/23 0919     Visit Number 8    Number of Visits 16    Date for PT Re-Evaluation 09/12/23    Authorization Type BCBS    PT Start Time [phone]    PT Stop Time 1005    PT Time Calculation (min) 40 min    Activity Tolerance Patient tolerated treatment well;No increased pain    Behavior During Therapy WFL for tasks assessed/performed           Past Medical History:  Diagnosis Date   Arthritis    BACK   CVA (cerebral vascular accident) (HCC) 2020   Eczema of hand    Hypertension    Low back pain    Smoker    Past Surgical History:  Procedure Laterality Date   RESECTION DISTAL CLAVICAL Left 08/13/2015   Procedure: RESECTION DISTAL CLAVICAL;  Surgeon: Toribio JULIANNA Chancy, MD;  Location: [facility];  Service: Orthopedics;  Laterality: Left;   SHOULDER ARTHROSCOPY WITH ROTATOR CUFF REPAIR AND SUBACROMIAL DECOMPRESSION Left 08/13/2015   Procedure: LEFT SHOULDER ARTHROSCOPY DEBRIDEMENT ACROMIOPLASTY, DISTAL CLAVICAL EXCISION ROTATOR CUFF REPAIR ;  Surgeon: Toribio JULIANNA Chancy, MD;  Location: [facility];  Service: Orthopedics;  Laterality: Left;   TONSILLECTOMY     UPPER GASTROINTESTINAL ENDOSCOPY     2012,2013,2015 jacobs    Patient Active Problem List   Diagnosis Date Noted   Nontraumatic complete tear of right rotator cuff 07/12/2023   Dysphagia 02/26/2021   Stenosis of carotid artery 04/23/2020   Dilated aortic root (HCC) 04/23/2020   GERD (gastroesophageal reflux disease) 07/10/2019   OAG (open angle glaucoma) suspect, low risk, bilateral 02/18/2018   Tobacco use disorder 06/27/2014   Hand dermatitis 04/23/2014   Dizziness 01/24/2014   Left rotator cuff tear 12/20/2013   Urinary frequency 05/17/2013   Low back pain 01/07/2011   Essential hypertension  09/15/2006    PCP: Merna Huxley, NP  REFERRING PROVIDER: Vita Morrow, MD  REFERRING DIAG: [id number] (ICD-10-CM) - Nontraumatic complete tear of left rotator cuff   THERAPY DIAG:  Chronic pain of both shoulders  Muscle weakness (generalized)  Abnormal posture  Rationale for Evaluation and Treatment: Rehabilitation  ONSET DATE: at least 10 years  SUBJECTIVE:                                                                                                                                                                                      SUBJECTIVE STATEMENT: Pt  reports his motion is no problem just the strength is a problem.   From eval: Pt states he had a rotator cuff repair on the left before and it retore. Pt reports he has to lift heavy at work -- works in [employer]. Has to lift overhead which is the most aggravating. Will carry at least 30 to 70 lbs. Pt states L shoulder hurts the most. Pt was recommended to try and do strengthening since he is too young for a full replacement.  Hand dominance: Right  PERTINENT HISTORY: L Rotator cuff repair ~5 years ago  PAIN:  Are you having pain? Yes: NPRS scale: 0/10 upon arrival, worst pain up to a 3 within the past week Pain location: Front of Lt shoulder Pain description: Numbness while sleeping  Aggravating factors: Overhead lifting, tenderness to the touch Relieving factors: Rest  PRECAUTIONS: None  RED FLAGS: None   WEIGHT BEARING RESTRICTIONS: No  FALLS:  Has patient fallen in last 6 months? 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  DIAGNOSTIC FINDINGS:  L shoulder MRI 06/10/23 IMPRESSION: 1. Prior rotator cuff  repair with postsurgical changes in the humeral head. Complete full-thickness, full width tear of the supraspinatus and infraspinatus tendons with 3.8 cm of retraction.  R shoulder MRI 06/10/23 IMPRESSION: 1. Complete full-thickness, full width tear of the supraspinatus and infraspinatus tendons with 5 cm of retraction. 2. Mild tendinosis of the subscapularis tendon. 3. Mild tendinosis of the intra-articular portion of the long head of the biceps tendon. 4. Moderate arthropathy of the acromioclavicular joint with severe marrow edema on either side of the joint.  PATIENT SURVEYS:  QuickDASH Score: 4.5 / 100 = 4.5 % The Patient-Specific Functional Scale  Initial:  I am going to ask you to identify up to 3 important activities that you are unable to do or are having difficulty with as a result of this problem.  Today are there any activities that you are unable to do or having difficulty with because of this?  (Patient shown scale and patient rated each activity)  Follow up: When you first came in you had difficulty performing these activities.  Today do you still have difficulty?  Patient-Specific activity scoring scheme (Point to one number):  0 1 2 3 4 5 6 7 8 9  10 Unable                                                                                                          Able to perform To perform  activity at the same Activity         Level as before                                                                                                                       Injury or problem Activity Initial (eval): Follow up:  Lifting overhead 8          COGNITION: Overall cognitive status: Within functional limits for tasks assessed     SENSATION: WFL  POSTURE: UT elevation L>R Anteriorly rotated glenohumeral head/rounded shoulders  UPPER EXTREMITY ROM: PROM WNL  Active ROM  Right eval Left eval Left/Right 07/19/2023 Left 08/01/23 Supine  Left/Right 08/24/23  Shoulder flexion 130* 115* 140/165 AA: 150 153/160 mild pain with coming down  Shoulder extension 55 55     Shoulder abduction 160 135*   156/156  Shoulder horizontal adduction   30/35    Shoulder internal rotation To sacrum To sacrum 80/75  T6/T6  Shoulder external rotation 15 -10 60/80 AA: 65 10/20 at 0 deg abd  Elbow flexion       Elbow extension       Wrist flexion       Wrist extension       Wrist ulnar deviation       Wrist radial deviation       Wrist pronation       Wrist supination       (Blank rows = not tested, * = pain)  UPPER EXTREMITY MMT:  MMT Right eval Left eval Left/Right 07/19/2023 Left/Right 08/24/23  Shoulder flexion 3+ thumbs up and palm down 3+ thumbs up and palm down  4/4 thumbs up and palm down  Shoulder extension prone 4 3+    Shoulder abduction 3 palm down 3 palm down  4-/4 thumbs up and palm down  Shoulder adduction      Shoulder internal rotation 5 5 28.5 pounds/21.1 pounds   Shoulder external rotation 3- 2+ 5.8 pounds/6.5 pounds 5.9 pounds/7.3 pounds  Middle trapezius prone 4 3  4-/4-  Lower trapezius prone  Unable without pain  3/3+  Elbow flexion      Elbow extension      Wrist flexion      Wrist extension      Wrist ulnar deviation      Wrist radial deviation      Wrist pronation      Wrist supination      Grip strength (lbs)      (Blank rows = not tested)  SHOULDER SPECIAL TESTS: Did not perform  JOINT MOBILITY TESTING:  GH joint mobility tight with anterior mobs and especially into ER  PALPATION:  TTP L pec  TREATMENT DATE:  08/24/23: TherEx: UBE L4 x 5 min fwd, 3 min bwd Recheck ROM and strength for MD note Reactive ER walk outs, towel under left shoulder: green TB x 15   TherAct: Wall scapular clock yellow TB x10 UE ranger  flexion to work on eccentric control x10 UE ranger shoulder horizontal abd with scap squeeze around his point of pain with arm elevated ~80 deg to work on eccentric control x10 on L UE ranger ER x5 but too painful for pt  08/22/23: TherEx: Shoulder flexion, abd, horizontal abd pulleys 2x10 Supine diagonals yellow TB 2x10 Supine Y with yellow TB 2x10 Supine Serratus anterior punch red TB 2x10  Neuromuscular Re-ed: Scapular clock on counter yellow TB 2x10 R&L *attempted red but caused some sharp pain Ball on wall circles 2x10 R&L in flexion Ball on wall circles 2x10 R&L in scaption   08/17/23: TherEx: UBE L1; 3 min fwd, 3 min bwd Supine scapular retraction 2x10 Supine hands behind head pec stretch 2x30 with diaphragmatic breaths  Neuromuscular Re-ed: Supine shoulder ER at 90 deg abd 2x10 Scapular clock on counter yellow TB 2x10 on L Shoulder flexion wall slides 2x10 Low trap setting 2x10  Manual Therapy: STM, TPR and manual stretch L pec  Self Care: Self massage with tennis ball along pec   08/15/23: TherEx: UBE L1; 3 min fwd, 3 min bwd Doorway bicep stretch x30 Doorway pec stretch 45 deg and 60 deg 2x30 each  Neuromuscular Re-ed: Wall scaption on R, too painful on L focusing on scapular rotation Scapular wall clock x10 on L, scapular clock on counter x10 on L Counter push up plus 2x10 (attempted wall but too much for pt) Seated reverse fly 2x10 working on decreasing UT overcompensation  Therapeutic Activity: Standing low row blue TB 2x10 Standing shoulder ext blue TB 2x10   08/08/23: Vitals: BP 137/98 seated on L  TherEx: Pulleys flexion, scaption, horizontal shoulder abd x2 min each Seated scap squeeze into ball squeeze Doorway pec stretch 45 deg and 60 deg 2x30 each Supine AAROM partial range shoulder flexion 2x10 Supine shoulder horizontal abd red TB 2x10 Supine shoulder ER at 60 deg abd 2x10 Side lying: shoulder abd 2 x 10 holding end range 3-5  sec  Manual therapy: STM & TPR L pec major/minor Pin and stretch pec minor and major   08/01/23:  TherEx:  Reactive ER walk outs, towel under left shoulder: green TB x 15  Supine AAROM: ER x 15 holding 3 sec Side lying: ER 2 x 10 holding end range 3-5 sec Side lying: shoulder abd 2 x 10 holding end range 3-5 sec Cross body stretch x 4 holding 15 sec UBE: Level 2 x 2 minutes each direction c rest break between  Functional Activities:  Rows: green TB 2 x 15 holding 3 seconds Shoulder extension: green TB 2 x 15 holding 3 sec Scapular retraction/shoulder depression x 10 holding 10 sec NMR:  Supine scapular stabilization x 20 slow controlled movements CCW/CW c 1 # weight    PATIENT EDUCATION: Education details: Exam findings, POC, initial HEP Person educated: Patient Education method: Explanation, Demonstration, and Handouts Education comprehension: verbalized understanding, returned demonstration, and needs further education  HOME EXERCISE PROGRAM: Access Code: [email or web address] URL: [email or web address] Date: 08/15/2023 Prepared by: Latiesha Harada April Earnie Starring  Exercises - Standing Shoulder Row with Anchored Resistance  - 1-2 x daily - 7 x weekly - 2 sets - 10 reps - Shoulder extension with resistance - Neutral  - 1-2 x  daily - 7 x weekly - 2 sets - 10 reps - Standing Scapular Retraction  - 5 x daily - 7 x weekly - 1 sets - 5 reps - 5 second hold - Shoulder External Rotation with Anchored Resistance  - 2 x daily - 7 x weekly - 2 sets - 10 reps - 3 hold - Shoulder Internal Rotation with Resistance  - 2 x daily - 7 x weekly - 2 sets - 10 reps - 3 hold - Supine Shoulder Horizontal Abduction with Resistance  - 1 x daily - 7 x weekly - 2 sets - 10 reps - Supine Shoulder External Rotation Stretch  - 1 x daily - 7 x weekly - 2 sets - 10 reps - Doorway Pec Stretch at 60 Elevation  - 1 x daily - 7 x weekly - 2 sets - 30 sec hold - Doorway Pec Stretch at 90 Degrees Abduction  - 1 x  daily - 7 x weekly - 2 sets - 30 sec hold - Wall Clock  - 1 x daily - 7 x weekly - 1 sets - 10 reps - Wall Push Up with Plus  - 1 x daily - 7 x weekly - 2 sets - 10 reps  ASSESSMENT:  CLINICAL IMPRESSION: Scott Carson is demonstrating good gains in strength and ROM. He has full ROM with pain only present with bringing his arm down from elevated position demonstrating continued deficits with eccentric control. Strength remains the biggest issue -- pt has a highly demanding labor intensive job and he does not yet have all the strength required to safely perform his duties (I.e. lifting >30 lbs). He will benefit from continued therapy for safe return to work.    OBJECTIVE IMPAIRMENTS: decreased activity tolerance, decreased endurance, decreased mobility, decreased ROM, decreased strength, increased fascial restrictions, increased muscle spasms, impaired UE functional use, improper body mechanics, postural dysfunction, and pain.   ACTIVITY LIMITATIONS: carrying, lifting, sleeping, dressing, and reach over head  PARTICIPATION LIMITATIONS: cleaning, community activity, occupation, and yard work  PERSONAL FACTORS: Age, Fitness, Past/current experiences, Profession, and Time since onset of injury/illness/exacerbation are also affecting patient's functional outcome.   REHAB POTENTIAL: Good  CLINICAL DECISION MAKING: Evolving/moderate complexity  EVALUATION COMPLEXITY: Moderate   GOALS: Goals reviewed with patient? Yes  SHORT TERM GOALS: Target date: 08/15/2023   Pt will be ind with initial HEP Baseline: Goal status: MET 08/01/23  2.  Pt will have improved shoulder elevation to >/=140 deg on L Baseline:  Goal status: Met 07/19/2023   LONG TERM GOALS: Target date: 09/12/2023   Pt will be ind with management and progression of HEP Baseline:  Goal status: INITIAL  2.  Pt will be able to lift at least 10# above shoulder height with >/=50% less pain than baseline Baseline:  Goal status:  INITIAL  3.  Pt will have improved PSFS to a 10 to demo MCID Baseline:  Goal status: INITIAL  4.  Pt will demo at least 4/5 bilat shoulder and scapular strength for improved lifting mechanics Baseline:  Goal status: INITIAL  5.  Pt will report >/=50% improvement in overall pain with activities Baseline:  Goal status: INITIAL   PLAN:  PT FREQUENCY: 2x/week  PT DURATION: 8 weeks  PLANNED INTERVENTIONS: 97164- PT Re-evaluation, 97750- Physical Performance Testing, 97110-Therapeutic exercises, 97530- Therapeutic activity, [id number]- Neuromuscular re-education, 97535- Self Care, 02859- Manual therapy, G0283- Electrical stimulation (unattended), 97016- Vasopneumatic device, [id number]- Ultrasound, 02966- Ionotophoresis 4mg /ml Dexamethasone , Patient/Family education, Taping, Dry Needling, Spinal mobilization,  Cryotherapy, and Moist heat  PLAN FOR NEXT SESSION: Strengthen scapular, deltoids, and remaining intact rotator cuff muscles.   Keaghan Bowens April Ma L Andres Vest, PT, DPT 08/24/2023, 9:21 AM   PHYSICAL THERAPY DISCHARGE SUMMARY  Visits from Start of Care: 8  Current functional level related to goals / functional outcomes: See note   Remaining deficits: See note   Education / Equipment: HEP  Patient goals were unknown progress (not reassessed). Patient is being discharged due to not returning since the last visit.  Ozell Silvan, PT, DPT, OCS, ATC 11/07/23  1:28 PM

## 2023-08-29 ENCOUNTER — Ambulatory Visit: Admitting: Orthopaedic Surgery

## 2023-08-29 ENCOUNTER — Encounter: Payer: Self-pay | Admitting: Orthopaedic Surgery

## 2023-08-29 DIAGNOSIS — M75122 Complete rotator cuff tear or rupture of left shoulder, not specified as traumatic: Secondary | ICD-10-CM

## 2023-08-29 DIAGNOSIS — M75121 Complete rotator cuff tear or rupture of right shoulder, not specified as traumatic: Secondary | ICD-10-CM

## 2023-08-29 NOTE — Progress Notes (Signed)
 Office Visit Note   Patient: Scott Carson           Date of Birth: 1970-03-16           MRN: [id number] Visit Date: 08/29/2023              Requested by: Merna Huxley, NP [address] [place],  [place] [address] PCP: Merna Huxley, NP   Assessment & Plan: Visit Diagnoses:  1. Nontraumatic complete tear of left rotator cuff   2. Nontraumatic complete tear of right rotator cuff     Plan: History of Present Illness Scott Carson is a 53 year old male with chronic rotator cuff tears who presents for follow-up of bilateral shoulder pain.  He experiences stiffness and fatigue in his shoulders, particularly in the morning, with discomfort upon waking. Cold weather exacerbates the pain. Despite these issues, he is able to perform daily activities such as bathing and reaching behind his back.  He is undergoing physical therapy once a week for six to eight weeks, with one missed session. During therapy, he is unable to lift more than four or five pounds, indicating significant limitations in lifting capacity.  He is scheduled to return to work on July 17th but is concerned about his ability to perform previous physical duties due to his shoulder condition. He is considering alternative roles within the company and contemplating seeking disability if unable to continue in his current role.  Assessment and Plan Chronic bilateral rotator cuff tear Irreparable tears in both shoulders limit function and strength. Too young for reverse. Treatment options limited. - Continue home exercises post physical therapy. - Provide work note with lifting restrictions (5-10 pounds). - Discuss alternative duties with employer. - Consider attorney consultation for disability options if work accommodations are unavailable.  Follow-Up Instructions: No follow-ups on file.   Orders:  No orders of the defined types were placed in this encounter.  No orders of the defined types were placed in this  encounter.     Procedures: No procedures performed   Clinical Data: No additional findings.   Subjective: Chief Complaint  Patient presents with   Right Shoulder - Follow-up   Left Shoulder - Follow-up    HPI  Review of Systems  Constitutional: Negative.   HENT: Negative.    Eyes: Negative.   Respiratory: Negative.    Cardiovascular: Negative.   Gastrointestinal: Negative.   Endocrine: Negative.   Genitourinary: Negative.   Skin: Negative.   Allergic/Immunologic: Negative.   Neurological: Negative.   Hematological: Negative.   Psychiatric/Behavioral: Negative.    All other systems reviewed and are negative.    Objective: Vital Signs: There were no vitals taken for this visit.  Physical Exam Vitals and nursing note reviewed.  Constitutional:      Appearance: He is well-developed.  Pulmonary:     Effort: Pulmonary effort is normal.  Abdominal:     Palpations: Abdomen is soft.  Skin:    General: Skin is warm.  Neurological:     Mental Status: He is alert and oriented to person, place, and time.  Psychiatric:        Behavior: Behavior normal.        Thought Content: Thought content normal.        Judgment: Judgment normal.     Ortho Exam  Specialty Comments:  No specialty comments available.  Imaging: No results found.   PMFS History: Patient Active Problem List   Diagnosis Date Noted  Nontraumatic complete tear of right rotator cuff 07/12/2023   Dysphagia 02/26/2021   Stenosis of carotid artery 04/23/2020   Dilated aortic root (HCC) 04/23/2020   GERD (gastroesophageal reflux disease) 07/10/2019   OAG (open angle glaucoma) suspect, low risk, bilateral 02/18/2018   Tobacco use disorder 06/27/2014   Hand dermatitis 04/23/2014   Dizziness 01/24/2014   Left rotator cuff tear 12/20/2013   Urinary frequency 05/17/2013   Low back pain 01/07/2011   Essential hypertension 09/15/2006   Past Medical History:  Diagnosis Date   Arthritis     BACK   CVA (cerebral vascular accident) (HCC) 2020   Eczema of hand    Hypertension    Low back pain    Smoker     Family History  Problem Relation Age of Onset   Hypertension Mother    Glaucoma Father    Diabetes Maternal Grandmother    Stroke Maternal Grandmother    Colon cancer Paternal Grandfather    Other Neg Hx        no family history of colon cancer   Stomach cancer Neg Hx    Esophageal cancer Neg Hx    Prostate cancer Neg Hx    Colon polyps Neg Hx    Rectal cancer Neg Hx     Past Surgical History:  Procedure Laterality Date   RESECTION DISTAL CLAVICAL Left 08/13/2015   Procedure: RESECTION DISTAL CLAVICAL;  Surgeon: Toribio JULIANNA Chancy, MD;  Location: [facility];  Service: Orthopedics;  Laterality: Left;   SHOULDER ARTHROSCOPY WITH ROTATOR CUFF REPAIR AND SUBACROMIAL DECOMPRESSION Left 08/13/2015   Procedure: LEFT SHOULDER ARTHROSCOPY DEBRIDEMENT ACROMIOPLASTY, DISTAL CLAVICAL EXCISION ROTATOR CUFF REPAIR ;  Surgeon: Toribio JULIANNA Chancy, MD;  Location: [facility];  Service: Orthopedics;  Laterality: Left;   TONSILLECTOMY     UPPER GASTROINTESTINAL ENDOSCOPY     2012,2013,2015 jacobs    Social History   Occupational History   Occupation: Agricultural consultant    Comment: Advice worker  Tobacco Use   Smoking status: Former    Current packs/day: 0.50    Average packs/day: 0.5 packs/day for 2.3 years (1.1 ttl pk-yrs)    Types: Cigars, Cigarettes    Start date: 04/30/2012    Quit date: 07/01/2013   Smokeless tobacco: Never  Vaping Use   Vaping status: Never Used  Substance and Sexual Activity   Alcohol use: Not Currently    Comment: occasionaly   Drug use: Yes    Types: Marijuana    Comment: smoked 06/20/19   Sexual activity: Not on file

## 2023-09-12 ENCOUNTER — Telehealth: Payer: Self-pay | Admitting: Orthopaedic Surgery

## 2023-09-12 NOTE — Telephone Encounter (Signed)
 That's fine

## 2023-09-12 NOTE — Telephone Encounter (Signed)
 Received call from patient. He states his employer is not allowing him to come back to work with restrictions of no lifting greater than 5lbs. He states he is unable to return to his job and has applied for LTD. Please provide a note that patient is unable to return to his previous job due to shoulder condition. Pts callback [phone]

## 2023-09-12 NOTE — Telephone Encounter (Signed)
 Spoke with patient. Note has been added to his chart. He wants this sent to [facility].

## 2023-09-13 NOTE — Telephone Encounter (Signed)
 Note faxed [employer] [phone]

## 2023-09-20 ENCOUNTER — Telehealth: Payer: Self-pay | Admitting: Orthopaedic Surgery

## 2023-09-20 NOTE — Telephone Encounter (Signed)
 Patient was provided a work note stating that he is unable to return to previous job due to shoulder condition on 09/12/23. New [employer] forms have been received. Please advise if we complete these for continued disability. Thank you!

## 2023-09-20 NOTE — Telephone Encounter (Signed)
Forms completed/faxed

## 2023-09-20 NOTE — Telephone Encounter (Signed)
Sure we can do that

## 2023-11-10 ENCOUNTER — Other Ambulatory Visit: Payer: Self-pay | Admitting: Adult Health

## 2023-11-10 DIAGNOSIS — I10 Essential (primary) hypertension: Secondary | ICD-10-CM

## 2023-12-25 ENCOUNTER — Encounter: Payer: Self-pay | Admitting: Radiology

## 2024-02-14 ENCOUNTER — Other Ambulatory Visit: Payer: Self-pay | Admitting: Adult Health

## 2024-02-14 DIAGNOSIS — I10 Essential (primary) hypertension: Secondary | ICD-10-CM

## 2024-02-19 ENCOUNTER — Other Ambulatory Visit: Payer: Self-pay | Admitting: Adult Health

## 2024-02-19 DIAGNOSIS — E782 Mixed hyperlipidemia: Secondary | ICD-10-CM

## 2024-02-19 DIAGNOSIS — I10 Essential (primary) hypertension: Secondary | ICD-10-CM

## 2024-02-19 NOTE — Telephone Encounter (Unsigned)
 Copied from CRM #[id number]. Topic: Clinical - Medication Refill >> Feb 19, 2024  3:10 PM Brittany M wrote: Medication: rosuvastatin  (CRESTOR ) 20 MG tablet ;  amLODipine  (NORVASC ) 10 MG tablet  Has the patient contacted their pharmacy? Yes (Agent: If no, request that the patient contact the pharmacy for the refill. If patient does not wish to contact the pharmacy document the reason why and proceed with request.) (Agent: If yes, when and what did the pharmacy advise?)  This is the patient's preferred pharmacy:   [facility] [phone] [address], [place] - [address] [place] [address] Phone: [phone] Fax: [phone]  Is this the correct pharmacy for this prescription? Yes If no, delete pharmacy and type the correct one.   Has the prescription been filled recently? Yes  Is the patient out of the medication? Yes  Has the patient been seen for an appointment in the last year OR does the patient have an upcoming appointment? Yes  Can we respond through MyChart? Yes  Agent: Please be advised that Rx refills may take up to 3 business days. We ask that you follow-up with your pharmacy.

## 2024-02-20 ENCOUNTER — Other Ambulatory Visit: Payer: Self-pay

## 2024-02-20 DIAGNOSIS — E782 Mixed hyperlipidemia: Secondary | ICD-10-CM

## 2024-02-20 MED ORDER — ROSUVASTATIN CALCIUM 20 MG PO TABS: ORAL_TABLET | ORAL | 3 refills | Status: AC
